# Patient Record
Sex: Male | Born: 1985 | Marital: Married
Health system: Midwestern US, Community
[De-identification: ages and names within clinical notes are randomized; demographics above are authoritative.]

## PROBLEM LIST (undated history)

## (undated) DIAGNOSIS — R634 Abnormal weight loss: Secondary | ICD-10-CM

## (undated) DIAGNOSIS — E349 Endocrine disorder, unspecified: Secondary | ICD-10-CM

## (undated) DIAGNOSIS — J45909 Unspecified asthma, uncomplicated: Secondary | ICD-10-CM

## (undated) DIAGNOSIS — I10 Essential (primary) hypertension: Secondary | ICD-10-CM

## (undated) DIAGNOSIS — G4730 Sleep apnea, unspecified: Secondary | ICD-10-CM

## (undated) DIAGNOSIS — J302 Other seasonal allergic rhinitis: Secondary | ICD-10-CM

## (undated) DIAGNOSIS — J342 Deviated nasal septum: Secondary | ICD-10-CM

## (undated) DIAGNOSIS — K219 Gastro-esophageal reflux disease without esophagitis: Secondary | ICD-10-CM

## (undated) DIAGNOSIS — Z9049 Acquired absence of other specified parts of digestive tract: Secondary | ICD-10-CM

## (undated) DIAGNOSIS — Z9852 Vasectomy status: Secondary | ICD-10-CM

## (undated) DIAGNOSIS — Z8659 Personal history of other mental and behavioral disorders: Secondary | ICD-10-CM

## (undated) DIAGNOSIS — S43439A Superior glenoid labrum lesion of unspecified shoulder, initial encounter: Secondary | ICD-10-CM

## (undated) HISTORY — DX: Sleep apnea, unspecified: G47.30

## (undated) HISTORY — DX: Endocrine disorder, unspecified: E34.9

## (undated) HISTORY — DX: Vasectomy status: Z98.52

## (undated) HISTORY — DX: Superior glenoid labrum lesion of unspecified shoulder, initial encounter: S43.439A

## (undated) HISTORY — DX: Gastro-esophageal reflux disease without esophagitis: K21.9

## (undated) HISTORY — DX: Deviated nasal septum: J34.2

## (undated) HISTORY — DX: Personal history of other mental and behavioral disorders: Z86.59

## (undated) HISTORY — DX: Acquired absence of other specified parts of digestive tract: Z90.49

## (undated) HISTORY — DX: Other seasonal allergic rhinitis: J30.2

## (undated) HISTORY — DX: Essential (primary) hypertension: I10

## (undated) HISTORY — DX: Unspecified asthma, uncomplicated: J45.909

---
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
## 2004-01-03 ENCOUNTER — Emergency Department (HOSPITAL_COMMUNITY): Admission: EM | Admit: 2004-01-03 | Discharge: 2004-01-03 | Payer: Self-pay | Admitting: Emergency Medicine

## 2013-08-26 LAB — TESTOSTERONE
Free Testosterone(Direct): 10.4
TESTOSTERONE FREE: 13.54
TESTOSTERONE: 5.11
Testosterone, Free Pct: 2.65

## 2013-08-26 LAB — LIPID PANEL
Cholesterol: 254 mg/dL — AB (ref 0–200)
HDL: 51 mg/dL (ref 35–70)
LDL Cholesterol: 173 mg/dL
Triglycerides: 150 mg/dL (ref 40–160)

## 2013-08-26 LAB — PROLACTIN: Prolactin: 5.2

## 2013-08-26 LAB — PSA: PSA: 0.7

## 2014-01-25 NOTE — Telephone Encounter (Signed)
Did you get a hold of him?

## 2014-01-25 NOTE — Telephone Encounter (Signed)
Tyrone Vazquez WAS IN [facility] 11/27-11/30 AND HAS A DIAGNOSIS OF CHOLELITHIASIS AND CHOLECYSTITIS. THE PATIENT'S WIFE STATED THAT THE ER DOCTOR WANTS HIM IN AS SOON AS POSSIBLE FOR HE IS STILL IN A LOT OF PAIN AND IS VOMITING. HOW SOON WOULD YOU LIKE TO GET HIM IN?

## 2014-01-26 NOTE — Telephone Encounter (Signed)
Yes sir I did! When I talked to him he said he does not want to wait and will figure something out. Then maybe an hour later his wife called back and [place] spoke with her. She was wanting to get him in today 01/26/2014 per the ER doctor  and didn't want to wait until the next available appointment and then the phone hung up. Tried calling but no answer.

## 2014-04-29 LAB — LIPID PANEL
Cholesterol: 260 mg/dL — AB (ref 0–200)
HDL: 35 mg/dL (ref 35–70)
LDL CALC: 166 mg/dL
Triglycerides: 295 mg/dL — AB (ref 40–160)

## 2014-04-29 LAB — PSA: PROSTATE SPECIFIC ANTIGEN: 0.8

## 2014-04-29 LAB — TESTOSTERONE
TESTOSTERONE: 196
Testosterone, Free: 5.1

## 2014-05-28 LAB — CORTISOL: CORTISOL: 3.5

## 2014-05-28 LAB — FSH/LH
FSH: 1.2
LH: 1.4

## 2014-05-28 LAB — PROLACTIN: Prolactin: 9.7

## 2014-06-07 ENCOUNTER — Ambulatory Visit: Admit: 2014-06-07 | Discharge: 2014-06-07 | Payer: BLUE CROSS/BLUE SHIELD | Attending: Clinical Nurse Specialist

## 2014-06-07 DIAGNOSIS — E291 Testicular hypofunction: Secondary | ICD-10-CM

## 2014-06-07 NOTE — Addendum Note (Signed)
Addended by: Perley JainKIANOS, DEVIN on: 06/07/2014 01:34 PM     Modules accepted: Orders

## 2014-06-07 NOTE — Progress Notes (Signed)
[facility]  [address]  [place] [id number]  Dept: [phone]  Loc: [phone]    Visit Date: 06/07/2014      HPI:     Bayard BeaverChase Tokunaga is a 29 y.o. male who presents today for:  Chief Complaint   Patient presents with   ??? Other     new patient       HPI:   Pt here for evaluation of   Pt went for 285 to 190 lbs in 10 months.  Started diet and exercise 2014 into 3.2015.  He states he started getting discomfort in muscles while working out so he stopped.  He doses go to gym 2-3 days per week now.  He complaints of low energy levels and fatigue. He was diagnosed with hypogonadism 2-3 years ago.  PCP did obtain BW.  He has been on DEPO 1.5 cc every  3 weeks.  He is unsure of dose.    He does not have gets  Did have vasectomy last year  No Hx of anabolic steroid use   No hx of testicular trauma   Denies ED  Positive desire   Testerone level 196 Free 5.1 (done at 1027 am) (04/29/14).  FSH and LH low.  Normal Prolactin and PSA   No evaluation for sleep apnea       Past Medical History   Diagnosis Date   ??? Asthma    ??? Hypertension    ??? Anxiety    ??? GERD (gastroesophageal reflux disease)       Past Surgical History   Procedure Laterality Date   ??? Cholecystectomy     ??? Rotator cuff repair Right      Current Outpatient Prescriptions   Medication Sig Dispense Refill   ??? chlorthalidone (HYGROTON) 25 MG tablet 25 mg     ??? DULoxetine (CYMBALTA) 60 MG capsule Take 60 mg by mouth daily     ??? ferrous sulfate 325 (65 FE) MG tablet Take 325 mg by mouth daily (with breakfast)     ??? gabapentin (NEURONTIN) 300 MG capsule Take 300 mg by mouth 3 times daily     ??? Lisdexamfetamine Dimesylate 60 MG CAPS Take by mouth     ??? metoclopramide (REGLAN) 10 MG tablet Take 10 mg by mouth 4 times daily     ??? prochlorperazine (COMPAZINE) 10 MG tablet Take 10 mg by mouth every 6 hours as needed     ??? propranolol (INDERAL) 40 MG tablet Take 40 mg by mouth 3 times daily     ??? ranitidine (ZANTAC) 150 MG tablet Take 150  mg by mouth 2 times daily     ??? venlafaxine 150 MG extended release tablet Take 150 mg by mouth daily (with breakfast)     ??? testosterone cypionate (DEPOTESTOTERONE CYPIONATE) 100 MG/ML injection Inject 100 mg into the muscle once 1.5 every three weeks     ??? Dexmethylphenidate HCl ER 30 MG CP24 Take by mouth       No current facility-administered medications for this visit.     Allergies   Allergen Reactions   ??? Pcn [Penicillins] Swelling     No family history on file.  History   Substance Use Topics   ??? Smoking status: Never Smoker    ??? Smokeless tobacco: Not on file   ??? Alcohol Use: Yes        Subjective:      Review of Systems   Constitutional:  Positive for fever, chills, appetite change and fatigue.   HENT: Positive for rhinorrhea and sinus pressure. Negative for trouble swallowing and voice change.    Eyes: Negative for pain, redness, itching and visual disturbance.   Respiratory: Negative for apnea, cough, chest tightness and shortness of breath.    Cardiovascular: Negative for chest pain, palpitations and leg swelling.   Gastrointestinal: Positive for nausea, abdominal pain and diarrhea. Negative for vomiting and constipation.   Endocrine: Positive for polydipsia. Negative for cold intolerance, heat intolerance and polyphagia.   Musculoskeletal: Positive for back pain, joint swelling and arthralgias.   Skin: Negative for rash.        Dry skin   Neurological: Positive for dizziness, weakness, light-headedness and headaches. Negative for numbness.   Psychiatric/Behavioral: Positive for sleep disturbance. The patient is nervous/anxious.        Objective:     BP 124/80 mmHg   Pulse 68   Ht  (1.854 m)   Wt 239 lb (108.41 kg)   BMI 31.54 kg/m2    Physical Exam   Constitutional: He is oriented to person, place, and time. He appears well-developed and well-nourished.   HENT:   Head: Normocephalic and atraumatic.   Eyes: EOM are normal. Right eye exhibits no discharge.   Neck: No tracheal deviation present. No  thyromegaly present.   Cardiovascular: Normal rate.    Pulmonary/Chest: He has no wheezes. He has no rales. He exhibits no tenderness.   Abdominal: He exhibits no distension. There is no tenderness. There is no rebound.   Musculoskeletal: He exhibits no edema or tenderness.   Neurological: He is oriented to person, place, and time.   Foot Exam:  2+pulses.  No wounds.  Good sensation on monofilament testing    Skin: No rash noted.   Psychiatric: He has a normal mood and affect.            Diagnostic Workup:       Assessment:     1. Hypogonadism in male     2. Fatigue, unspecified type  TSH without Reflex    T4, free    Testosterone free and total male    Luteinizing Hormone    Follicle Stimulating Hormone    Sex Hormone Binding Globulin       Plan:     1. Hypogonadism in male   Pt has hx of hypogonadism of unknown cause.  Labs reviewed.  Plan:  Pt agreeable to stop Depo injections for 2 months.  Will order labs to be obtained at that time. Pt understands to obtain labs between 8-9 am.   Further recommendations will be made after results are obtained.  Pt understands importance to establish cause.  Also explained importance to f/u with sleep medicine to evaluate for sleep apnea.  Pt to have done soon    2. Fatigue, unspecified type   Will assess  thyroid function            No Follow-up on file.    Serafina Mitchell, NP

## 2014-06-08 LAB — TSH: TSH: 1.76 u[IU]/mL (ref 0.358–3.74)

## 2014-06-08 LAB — T4, FREE: T4 Free: 1.17 nG/dL (ref 0.76–1.46)

## 2014-06-08 NOTE — Telephone Encounter (Signed)
-----   Message from Tyrone MitchellFrank A Gargasz, NP sent at 06/08/2014  8:18 AM EDT -----  Please call pt and inform him that thyroid levels are normal

## 2014-06-08 NOTE — Telephone Encounter (Signed)
Patient notified and understands.

## 2014-06-11 ENCOUNTER — Encounter: Attending: Clinical Nurse Specialist

## 2014-06-11 ENCOUNTER — Telehealth

## 2014-06-11 NOTE — Telephone Encounter (Signed)
Lab order faxed over to [facility] @ [phone]

## 2014-06-11 NOTE — Telephone Encounter (Addendum)
[phone] This patients wife is calling in again and would like the earliest possible appointment.  She has taken him to ER before they only given anti nausea suppository and none of this is helping him can you tell me which appointment slot to place the patient in or do you want to call her.  The number is the top of this message.  The wife has been asked to take the patient to ER several times and would prefer to come into the office.  At our earliest appointment.  She said the cortisol test needs done correctly by our Doctor and continues to request an earlier appointment the OM is informed call transferred.

## 2014-06-11 NOTE — Telephone Encounter (Signed)
Please call pt ands inform him that he should f/u with PCP regarding this.  If symptoms do not resolve should go to ED for evaluation.  Symptoms of nausea and vomiting not related to low testosterone levels

## 2014-06-11 NOTE — Telephone Encounter (Signed)
Called the patients voicemail and left the message to f/u with pcp and Er if issue continues.

## 2014-06-11 NOTE — Telephone Encounter (Signed)
Spoke with pt.  Cortisol level ordered.  Please have pt obtaincortisol level in am between 8-9 am.  Please fax lab slip to [facility].  They will get this done tomorrow.  Also advised if symptoms do not resolve to get to ED ASAP.

## 2014-06-11 NOTE — Telephone Encounter (Signed)
Patients wife is calling  She told me the patient came home early and is fatigued and N/V all night can you work this patient in to see him today wife is very worried.

## 2014-06-15 NOTE — Telephone Encounter (Signed)
Patient c/o insomnia, vomitting, and fatigue. Would like to know cortisol tests results and next plan of action.

## 2014-06-16 NOTE — Telephone Encounter (Signed)
Left message for pt to call back

## 2014-06-16 NOTE — Telephone Encounter (Signed)
Called Chases phone left message to call us and inform if able to have the cortisol test done.

## 2014-06-16 NOTE — Telephone Encounter (Signed)
Pt cortisol level normal.  He should f/u with PCP to discuss possible GI referral.  Thanks

## 2014-06-16 NOTE — Telephone Encounter (Signed)
Spoke to pt about the below MSG. Pt verbally understood MSG from [place]. Thanks

## 2014-06-16 NOTE — Telephone Encounter (Signed)
FG

## 2014-06-16 NOTE — Telephone Encounter (Signed)
Spoke with the patient and he had the test got results and will f/u with pcp.

## 2014-06-16 NOTE — Telephone Encounter (Signed)
Please clarify if lab has the results.

## 2014-08-18 ENCOUNTER — Encounter: Attending: Clinical Nurse Specialist

## 2014-11-27 HISTORY — PX: SHOULDER SURGERY: SHX246

## 2015-01-27 HISTORY — PX: CHOLECYSTECTOMY: SHX55

## 2015-06-22 ENCOUNTER — Encounter: Payer: Self-pay | Admitting: Osteopathic Medicine

## 2015-06-22 ENCOUNTER — Ambulatory Visit (INDEPENDENT_AMBULATORY_CARE_PROVIDER_SITE_OTHER): Payer: 59 | Admitting: Osteopathic Medicine

## 2015-06-22 VITALS — BP 152/96 | HR 59 | Ht 73.0 in | Wt 249.0 lb

## 2015-06-22 DIAGNOSIS — Z9852 Vasectomy status: Secondary | ICD-10-CM

## 2015-06-22 DIAGNOSIS — E349 Endocrine disorder, unspecified: Secondary | ICD-10-CM

## 2015-06-22 DIAGNOSIS — K219 Gastro-esophageal reflux disease without esophagitis: Secondary | ICD-10-CM

## 2015-06-22 DIAGNOSIS — Z862 Personal history of diseases of the blood and blood-forming organs and certain disorders involving the immune mechanism: Secondary | ICD-10-CM

## 2015-06-22 DIAGNOSIS — J302 Other seasonal allergic rhinitis: Secondary | ICD-10-CM | POA: Diagnosis not present

## 2015-06-22 DIAGNOSIS — R5382 Chronic fatigue, unspecified: Secondary | ICD-10-CM

## 2015-06-22 DIAGNOSIS — I10 Essential (primary) hypertension: Secondary | ICD-10-CM

## 2015-06-22 DIAGNOSIS — Z9049 Acquired absence of other specified parts of digestive tract: Secondary | ICD-10-CM | POA: Insufficient documentation

## 2015-06-22 DIAGNOSIS — E291 Testicular hypofunction: Secondary | ICD-10-CM | POA: Diagnosis not present

## 2015-06-22 HISTORY — DX: Vasectomy status: Z98.52

## 2015-06-22 HISTORY — DX: Acquired absence of other specified parts of digestive tract: Z90.49

## 2015-06-22 HISTORY — DX: Gastro-esophageal reflux disease without esophagitis: K21.9

## 2015-06-22 HISTORY — DX: Essential (primary) hypertension: I10

## 2015-06-22 HISTORY — DX: Other seasonal allergic rhinitis: J30.2

## 2015-06-22 HISTORY — DX: Endocrine disorder, unspecified: E34.9

## 2015-06-22 MED ORDER — CHLORTHALIDONE 25 MG PO TABS: 12.5000 mg | ORAL_TABLET | Freq: Every day | ORAL | Status: DC

## 2015-06-22 MED ORDER — AZELASTINE HCL 0.15 % NA SOLN: 2.0000 | Freq: Two times a day (BID) | NASAL | Status: DC

## 2015-06-22 NOTE — Patient Instructions (Signed)
Plan to follow-up every 3-4 months for management/monitoring of chronic medical issues. Please don't hesitate to make an appointment sooner if you're having any acute concerns or problems!  We are starting medication for blood pressure and getting labs done - labs need to be drawn as close to 8:00 am as possible.   Please let your pharmacy know when you are running low on medications/refills (do not wait until you are out of medicines). Your pharmacy will send our office a request for the appropriate medications. Please allow our office 2-3 business days to process the needed refills.   At any visits to any of your specialists, or if you get vaccines at your workplace or pharmacy, please give them our clinic information so that they can forward [place] any records, including any tests which are done or changes to your medications. This allows all your physicians to communicate effectively, putting your primary care doctor at the center of your medical care and allowing [place] to effectively coordinate your care.   Let's plan to follow-up here in the office in 1 months for follow-up on blood pressure and testosterone and hopefully by then we will have records from [place]. I will call yo once I've reviewed the [place] records, so if you don't hear from me by 07/15/15 (Friday) that means we haven't gotten the records so let us know if you don't hear anything.   I am out of the office in the first 2 weeks of May but my partners will be covering for me if there is anything you need! Hopefully your lab results will be back by then and I can call you before I am out, but please let us know if you don't hear back. We can plan to probably have to repeat the Testosterone levels anyway.   Please let us know if there is anything else we can do for you. Take care! -Dr. Loni Muse.

## 2015-06-22 NOTE — Progress Notes (Signed)
HPI: Alan Soto. Alan Soto is a 30 y.o. male who presents to [place] today for chief complaint of:  Chief Complaint  Patient presents with  . Establish Care    Patient wants to discuss Testosterone and allergies    TESTOSTERONE CONCERNS:  . Duration:  Was on T injections starting in 2014 or 2015, been off for about a year.  . Context: previously treated for Low T when he lived in [place] (thinks every 4 - 6 weeks) then lost insurance and had to stop this and some other meds. Had been referred to an endocrinologist but was unable to follow up fully due to loss of insurance.  . Assoc signs/symptoms: severe fatigue.   ALLERGIES . Severity: not too bad since moving down here . Context: seasonal/environmental and multiple food allergy/intolerance . Modifying factors: previously doing desensitization in [place] as well as cortisone shots on occasion. Rotates OTC meds - zyrtec, benadryl, allegra, has also done Flonase, never been on nasal antihistamine   HISTORY ADHD - previously on [name].   HISTORY HTN - was previously on meds, lost 100 lbs and this issue got better. Before the move, 130s/80s. (+)FH HTN    Past medical, social and family history reviewed: Past Medical History  Diagnosis Date  . Hypertension    Past Surgical History  Procedure Laterality Date  . Cholecystectomy  01/2015  . Shoulder surgery  11/2014   Social History  Substance Use Topics  . Smoking status: Not on file  . Smokeless tobacco: Not on file  . Alcohol Use: Not on file   No family history on file.  Current Outpatient Prescriptions  Medication Sig Dispense Refill  . Ferrous Sulfate (IRON) 325 (65 Fe) MG TABS Take by mouth 2 (two) times daily.    Marland Kitchen PROAIR HFA 108 (90 Base) MCG/ACT inhaler INHALE 2 PUFFS EVERY 4 TO 6 HOURS AS NEEDED  3  . ranitidine (ZANTAC) 150 MG tablet Take 150 mg by mouth 2 (two) times daily.     No current facility-administered medications for this  visit.   Allergies  Allergen Reactions  . Penicillins       Review of Systems: CONSTITUTIONAL:  No  fever, no chills, No  unintentional weight changes HEAD/EYES/EARS/NOSE/THROAT: (+) occasional headache, no vision change, no hearing change, No  sore throat, No  sinus pressure, (+) hay fever CARDIAC: No  chest pain, No  pressure, No palpitations, No  orthopnea RESPIRATORY: No  cough, No  shortness of breath/wheeze GASTROINTESTINAL: No  nausea, No  vomiting, No  abdominal pain, No  blood in stool, No  diarrhea, No  constipation  MUSCULOSKELETAL: No  myalgia/arthralgia GENITOURINARY: No  incontinence, No  abnormal genital bleeding/discharge SKIN: No  rash/wounds/concerning lesions HEM/ONC: No  easy bruising/bleeding, No  abnormal lymph node ENDOCRINE: No polyuria/polydipsia/polyphagia, No  heat/cold intolerance  NEUROLOGIC: No  weakness, No  dizziness, No  slurred speech PSYCHIATRIC: No  concerns with depression, No  concerns with anxiety, No sleep problems  Exam:  BP 152/96 mmHg  Pulse 59  Ht 6\' 1"  (1.854 m)  Wt 249 lb (112.946 kg)  BMI 32.86 kg/m2 similar on manual recheck of BP L and R arm Constitutional: VS see above. General Appearance: alert, well-developed, well-nourished, NAD Eyes: Normal lids and conjunctive, non-icteric sclera, PERRLA Ears, Nose, Mouth, Throat: MMM, Normal external inspection ears/nares/mouth/lips/gums, TM normal bilaterally. Pharynx no erythema, no exudate.  Neck: No masses, trachea midline. No thyroid enlargement/tenderness/mass appreciated. No lymphadenopathy Respiratory: Normal respiratory effort.  no wheeze, no rhonchi, no rales Cardiovascular: S1/S2 normal, no murmur, no rub/gallop auscultated. RRR. No lower extremity edema. Gastrointestinal: Nontender, no masses. No hepatomegaly, no splenomegaly. No hernia appreciated. Bowel sounds normal. Rectal exam deferred.  Musculoskeletal: Gait normal. No clubbing/cyanosis of digits.  Neurological: No cranial  nerve deficit on limited exam. Motor and sensation intact and symmetric Skin: warm, dry, intact. No rash/ulcer. No concerning nevi or subq nodules on limited exam.   Psychiatric: Normal judgment/insight. Normal mood and affect.     ASSESSMENT/PLAN: See pt instructions, await records, labs pending, restart low dose chlorthalidone.   Testosterone deficiency - Plan: Testosterone Total,Free,Bio, Males  Chronic fatigue - Plan: CBC with Differential/Platelet, COMPLETE METABOLIC PANEL WITH GFR, TSH, VITAMIN D 25 Hydroxy (Vit-D Deficiency, Fractures)  Seasonal allergies - Plan: Azelastine HCl 0.15 % SOLN  Gastroesophageal reflux disease, esophagitis presence not specified  History of anemia - Plan: CBC with Differential/Platelet  History of cholecystectomy - [id number] - complications from surgery, persistent N/V and abdominal pain, subsequent Endo/Colonoscopies fine, removal seems to have resulted in food intol/allerg  S/P vasectomy  Essential hypertension - Plan: CBC with Differential/Platelet, COMPLETE METABOLIC PANEL WITH GFR, TSH, Lipid panel, Aldosterone + renin activity w/ ratio, chlorthalidone (HYGROTON) 25 MG tablet     All questions were answered. Visit summary with updated medication list and pertinent instructions was printed for patient. ER/RTC precautions were reviewed with the patient. Return in about 1 month (around 07/22/2015), or sooner if needed, for BLOOD PRESSURE RECHECK, GO OVER LABS (OV30).

## 2015-06-23 ENCOUNTER — Other Ambulatory Visit: Payer: Self-pay | Admitting: Osteopathic Medicine

## 2015-06-23 MED ORDER — AZELASTINE HCL 0.1 % NA SOLN: 2.0000 | Freq: Two times a day (BID) | NASAL | Status: DC

## 2015-07-06 ENCOUNTER — Ambulatory Visit (INDEPENDENT_AMBULATORY_CARE_PROVIDER_SITE_OTHER): Payer: 59 | Admitting: Physician Assistant

## 2015-07-06 VITALS — BP 141/74 | HR 93 | Temp 98.9°F | Ht 73.0 in | Wt 249.0 lb

## 2015-07-06 DIAGNOSIS — J329 Chronic sinusitis, unspecified: Secondary | ICD-10-CM | POA: Diagnosis not present

## 2015-07-06 DIAGNOSIS — A499 Bacterial infection, unspecified: Secondary | ICD-10-CM | POA: Diagnosis not present

## 2015-07-06 DIAGNOSIS — B9689 Other specified bacterial agents as the cause of diseases classified elsewhere: Secondary | ICD-10-CM

## 2015-07-06 LAB — CBC WITH DIFFERENTIAL/PLATELET
BASOS ABS: 111 {cells}/uL (ref 0–200)
Basophils Relative: 1 %
EOS ABS: 666 {cells}/uL — AB (ref 15–500)
EOS PCT: 6 %
HCT: 45.1 % (ref 38.5–50.0)
HEMOGLOBIN: 15.5 g/dL (ref 13.2–17.1)
Lymphocytes Relative: 22 %
Lymphs Abs: 2442 cells/uL (ref 850–3900)
MCH: 26.9 pg — ABNORMAL LOW (ref 27.0–33.0)
MCHC: 34.4 g/dL (ref 32.0–36.0)
MCV: 78.3 fL — AB (ref 80.0–100.0)
MONOS PCT: 7 %
MPV: 9.6 fL (ref 7.5–12.5)
Monocytes Absolute: 777 cells/uL (ref 200–950)
NEUTROS ABS: 7104 {cells}/uL (ref 1500–7800)
NEUTROS PCT: 64 %
PLATELETS: 339 10*3/uL (ref 140–400)
RBC: 5.76 MIL/uL (ref 4.20–5.80)
RDW: 13.8 % (ref 11.0–15.0)
WBC: 11.1 10*3/uL — ABNORMAL HIGH (ref 3.8–10.8)

## 2015-07-06 MED ORDER — AZITHROMYCIN 250 MG PO TABS: ORAL_TABLET | ORAL | Status: DC

## 2015-07-06 MED ORDER — HYDROCODONE-HOMATROPINE 5-1.5 MG/5ML PO SYRP: 5.0000 mL | ORAL_SOLUTION | Freq: Every evening | ORAL | Status: DC | PRN

## 2015-07-06 NOTE — Progress Notes (Addendum)
   Subjective:    Patient ID: Alan Soto, male    DOB: 29-Feb-1985, 30 y.o.   MRN: [id number]  HPI Patient is complaining of productive cough, congestion, sore throat, headache, and left ear pain for the past 5 days. Denies fever, shortness of breath, wheezing, and chest tightness. Patient has been taking Dayquil, Nyquil, and Sudafed at home with no relief.    Review of Systems  Constitutional: Negative for fever, chills and fatigue.  HENT: Positive for congestion and ear pain (Left).   Respiratory: Positive for cough. Negative for shortness of breath and wheezing.   Neurological: Positive for headaches.  All other systems reviewed and are negative.      Objective:   Physical Exam  Constitutional: He is oriented to person, place, and time. He appears well-developed and well-nourished.  HENT:  Head: Normocephalic and atraumatic.  Right Ear: Tympanic membrane is not erythematous, not retracted and not bulging.  Left Ear: Tympanic membrane is erythematous. Tympanic membrane is not retracted and not bulging.  Nose: Right sinus exhibits maxillary sinus tenderness. Right sinus exhibits no frontal sinus tenderness. Left sinus exhibits maxillary sinus tenderness. Left sinus exhibits no frontal sinus tenderness.  Eyes: Conjunctivae are normal. Right eye exhibits no discharge. Left eye exhibits no discharge.  Cardiovascular: Normal rate, regular rhythm and normal heart sounds.   Pulmonary/Chest: Effort normal and breath sounds normal. No respiratory distress. He has no wheezes. He has no rales. He exhibits no tenderness.  Lymphadenopathy:    He has cervical adenopathy.  Neurological: He is alert and oriented to person, place, and time.  Skin: Skin is warm and dry.  Psychiatric: He has a normal mood and affect. His behavior is normal. Thought content normal.          Assessment & Plan:  1. Acute Bacterial Maxillary Sinusitis- Patient presents with 5 day history of cough, congestion,  sore throat, headache, left ear pain/pressure. On PE, he is tender to palpation of the maxillary sinuses. I am going to treat with azithromycin. I am also sending Hycodan for cough relief.

## 2015-07-06 NOTE — Patient Instructions (Signed)

## 2015-07-07 ENCOUNTER — Encounter: Payer: Self-pay | Admitting: Family Medicine

## 2015-07-07 ENCOUNTER — Ambulatory Visit (INDEPENDENT_AMBULATORY_CARE_PROVIDER_SITE_OTHER): Payer: 59 | Admitting: Family Medicine

## 2015-07-07 VITALS — BP 154/92 | HR 83 | Temp 98.7°F | Wt 240.0 lb

## 2015-07-07 DIAGNOSIS — R112 Nausea with vomiting, unspecified: Secondary | ICD-10-CM

## 2015-07-07 LAB — COMPLETE METABOLIC PANEL WITH GFR
ALBUMIN: 4.6 g/dL (ref 3.6–5.1)
ALK PHOS: 68 U/L (ref 40–115)
ALT: 29 U/L (ref 9–46)
AST: 22 U/L (ref 10–40)
BILIRUBIN TOTAL: 0.6 mg/dL (ref 0.2–1.2)
BUN: 7 mg/dL (ref 7–25)
CO2: 26 mmol/L (ref 20–31)
Calcium: 9.7 mg/dL (ref 8.6–10.3)
Chloride: 97 mmol/L — ABNORMAL LOW (ref 98–110)
Creat: 0.84 mg/dL (ref 0.60–1.35)
Glucose, Bld: 129 mg/dL — ABNORMAL HIGH (ref 65–99)
POTASSIUM: 4.6 mmol/L (ref 3.5–5.3)
Sodium: 136 mmol/L (ref 135–146)
Total Protein: 7.7 g/dL (ref 6.1–8.1)

## 2015-07-07 LAB — LIPID PANEL
CHOLESTEROL: 255 mg/dL — AB (ref 125–200)
HDL: 42 mg/dL (ref >=40)
LDL Cholesterol: 164 mg/dL — ABNORMAL HIGH (ref <130)
TRIGLYCERIDES: 245 mg/dL — AB (ref <150)
Total CHOL/HDL Ratio: 6.1 Ratio — ABNORMAL HIGH (ref <=5.0)
VLDL: 49 mg/dL — ABNORMAL HIGH (ref <30)

## 2015-07-07 LAB — VITAMIN D 25 HYDROXY (VIT D DEFICIENCY, FRACTURES): VIT D 25 HYDROXY: 22 ng/mL — AB (ref 30–100)

## 2015-07-07 LAB — TESTOSTERONE TOTAL,FREE,BIO, MALES
ALBUMIN: 4.6 g/dL (ref 3.6–5.1)
SEX HORMONE BINDING: 30 nmol/L (ref 10–50)
Testosterone, Bioavailable: 135.2 ng/dL (ref 130.5–681.7)
Testosterone, Free: 64.4 pg/mL (ref 47.0–244.0)
Testosterone: 451 ng/dL (ref 250–827)

## 2015-07-07 LAB — TSH: TSH: 2.45 m[IU]/L (ref 0.40–4.50)

## 2015-07-07 MED ORDER — PROMETHAZINE HCL 25 MG PO TABS: 25.0000 mg | ORAL_TABLET | Freq: Three times a day (TID) | ORAL | Status: DC | PRN

## 2015-07-07 NOTE — Progress Notes (Signed)
CC: Alan Soto is a 30 y.o. male is here for Allergic Reaction   Subjective: HPI:  Complains of vomiting, decreased appetite and fatigue present ever since yesterday that began one hour after taking azithromycin and Hycodan. He vomited frequently yesterday until he found Zofran and was able to fall sleep without any nauseousness. He woke up this morning and after taking azithromycin and Hycodan throughout again. He tells me that his tolerated both these medications in the past. He had blood work done yesterday that showed a unremarkable CMP. He denies any abdominal pain unless he is throwing up. No diarrhea nor genitourinary complaints. No chest pain or shortness of breath.   Review Of Systems Outlined In HPI  Past Medical History  Diagnosis Date  . Hypertension     Past Surgical History  Procedure Laterality Date  . Cholecystectomy  01/2015  . Shoulder surgery  11/2014   No family history on file.  Social History   Social History  . Marital Status: Single    Spouse Name: N/A  . Number of Children: N/A  . Years of Education: N/A   Occupational History  . Not on file.   Social History Main Topics  . Smoking status: Never Smoker   . Smokeless tobacco: Not on file  . Alcohol Use: Not on file  . Drug Use: Not on file  . Sexual Activity: Not on file   Other Topics Concern  . Not on file   Social History Narrative     Objective: BP 154/92 mmHg  Pulse 83  Temp(Src) 98.7 F (37.1 C) (Oral)  Wt 240 lb (108.863 kg)  SpO2 93%  Vital signs reviewed. General: Alert and Oriented, No Acute Distress HEENT: Pupils equal, round, reactive to light. Conjunctivae clear.  External ears unremarkable.  Moist mucous membranes. Lungs: Clear and comfortable work of breathing, speaking in full sentences without accessory muscle use. Cardiac: Regular rate and rhythm.  Neuro: CN II-XII grossly intact, gait normal. Extremities: No peripheral edema.  Strong peripheral pulses.   Mental Status: No depression, anxiety, nor agitation. Logical though process. Skin: Warm and dry.  Assessment & Plan: Alan Soto was seen today for allergic reaction.  Diagnoses and all orders for this visit:  Nausea and vomiting, intractability of vomiting not specified, unspecified vomiting type  Other orders -     promethazine (PHENERGAN) 25 MG tablet; Take 1 tablet (25 mg total) by mouth every 8 (eight) hours as needed for nausea or vomiting.   The presence of the patient's I went through his old past medical records that were recently faxed to [place] and we learned that he is never actually taken Hycodan before. It's likely that the Hycodan as was causing him to have vomiting so encouraged him to stop taking this medication. He was given a single tablet of promethazine today and 250 mg of azithromycin that was likely throwing up earlier this morning. He and his wife requesting IV fluids due to his inability to keep food and liquid down. I let them know that is not tachycardic and is not hypotensive so it's not 100% necessary but the LEFT on the table and they decided to go through with it. He received 1 L of IV fluids and told me he felt a little better from a hydration standpoint after he received this. Signs and symptoms requring emergent/urgent reevaluation were discussed with the patient.  25 minutes spent face-to-face during visit today of which at least 50% was counseling or coordinating care regarding:  1. Nausea and vomiting, intractability of vomiting not specified, unspecified vomiting type      Return if symptoms worsen or fail to improve.

## 2015-07-07 NOTE — Progress Notes (Signed)
   Subjective:    I'm seeing this patient as a consultation for:  Dr. Marcial Pacas  CC: Venous access  HPI: This is a pleasant 30 year old male with dehydration, and inability to take oral rehydration, I'm consulted for venous access.  Past medical history, Surgical history, Family history not pertinant except as noted below, Social history, Allergies, and medications have been entered into the medical record, reviewed, and no changes needed.   Review of Systems: No headache, visual changes, nausea, vomiting, diarrhea, constipation, dizziness, abdominal pain, skin rash, fevers, chills, night sweats, weight loss, swollen lymph nodes, body aches, joint swelling, muscle aches, chest pain, shortness of breath, mood changes, visual or auditory hallucinations.   Objective:   General: Well Developed, well nourished, and in no acute distress.  Neuro/Psych: Alert and oriented x3, extra-ocular muscles intact, able to move all 4 extremities, sensation grossly intact. Skin: Warm and dry, no rashes noted.  Respiratory: Not using accessory muscles, speaking in full sentences, trachea midline.  Cardiovascular: Pulses palpable, no extremity edema. Abdomen: Does not appear distended.  20-gauge angiocatheter placed in the left venous plexus of the hand, further fluid management per primary treating provider.  Impression and Recommendations:   This case required medical decision making of moderate complexity.  1. Venous access: 20-gauge angiocatheter placed in the left venous plexus of the hand, further management per primary. ___________________________________________ Gwen Her. Dianah Field, M.D., ABFM., CAQSM. Primary Care and [place] Instructor of [employer] [employer] [facility] of Medicine

## 2015-07-11 ENCOUNTER — Telehealth: Payer: Self-pay | Admitting: Family Medicine

## 2015-07-11 MED ORDER — VITAMIN D (ERGOCALCIFEROL) 1.25 MG (50000 UNIT) PO CAPS: 50000.0000 [IU] | ORAL_CAPSULE | ORAL | Status: DC

## 2015-07-11 NOTE — Telephone Encounter (Signed)
Will you please let patient know that all of his labs are not back as of today but from what's resulted so far it looks like his vitamin D level is deficient. This can contribute to fatigue and I"d recommend he start on a weekly supplement that I sent to his pharmacy.

## 2015-07-11 NOTE — Telephone Encounter (Signed)
CALLED TO GIVE PATIENT RESULTS BUT WAS UNABLE TO LEAVE A MESSAGE DUE TO A RECORDING COMING ON SAYING PATIENT IS UNABLE TO ACCEPT CALLS AT THIS TIME. Rhonda Cunningham,CMA

## 2015-07-13 LAB — ALDOSTERONE + RENIN ACTIVITY W/ RATIO
ALDO / PRA RATIO: 3.1 ratio (ref 0.9–28.9)
ALDOSTERONE: 15 ng/dL
PRA LC/MS/MS: 4.82 ng/mL/h (ref 0.25–5.82)

## 2015-07-21 ENCOUNTER — Encounter: Payer: Self-pay | Admitting: Osteopathic Medicine

## 2015-07-21 DIAGNOSIS — Z8659 Personal history of other mental and behavioral disorders: Secondary | ICD-10-CM

## 2015-07-21 DIAGNOSIS — S43439A Superior glenoid labrum lesion of unspecified shoulder, initial encounter: Secondary | ICD-10-CM

## 2015-07-21 DIAGNOSIS — G4730 Sleep apnea, unspecified: Secondary | ICD-10-CM

## 2015-07-21 HISTORY — DX: Superior glenoid labrum lesion of unspecified shoulder, initial encounter: S43.439A

## 2015-07-21 HISTORY — DX: Personal history of other mental and behavioral disorders: Z86.59

## 2015-07-21 HISTORY — DX: Sleep apnea, unspecified: G47.30

## 2015-07-22 ENCOUNTER — Encounter: Payer: Self-pay | Admitting: Osteopathic Medicine

## 2015-07-22 ENCOUNTER — Ambulatory Visit (INDEPENDENT_AMBULATORY_CARE_PROVIDER_SITE_OTHER): Payer: 59 | Admitting: Osteopathic Medicine

## 2015-07-22 VITALS — BP 131/86 | HR 68 | Ht 73.0 in | Wt 252.0 lb

## 2015-07-22 DIAGNOSIS — B9689 Other specified bacterial agents as the cause of diseases classified elsewhere: Secondary | ICD-10-CM | POA: Insufficient documentation

## 2015-07-22 DIAGNOSIS — D721 Eosinophilia, unspecified: Secondary | ICD-10-CM | POA: Insufficient documentation

## 2015-07-22 DIAGNOSIS — J329 Chronic sinusitis, unspecified: Secondary | ICD-10-CM

## 2015-07-22 DIAGNOSIS — J302 Other seasonal allergic rhinitis: Secondary | ICD-10-CM | POA: Diagnosis not present

## 2015-07-22 DIAGNOSIS — I10 Essential (primary) hypertension: Secondary | ICD-10-CM | POA: Diagnosis not present

## 2015-07-22 MED ORDER — DOXYCYCLINE MONOHYDRATE 100 MG PO CAPS: 100.0000 mg | ORAL_CAPSULE | Freq: Two times a day (BID) | ORAL | Status: DC

## 2015-07-22 NOTE — Progress Notes (Signed)
HPI: Alan Soto. Rabelo is a 30 y.o. male who presents to [facility] today for chief complaint of:  Chief Complaint  Patient presents with  . Follow-up    SINUS/EAR - Seen by Luvenia Starch 07/06/15 (16 days ago) was given z-pack and hycodan then having vomiting issues, came in for IV fluids after developing nausea and vomiting. Previously seen by ENT in back in [place], was plan for CT but this was unable to be done. Taking Benadryl at night before sleep. Astelin helping.   TESTOSTERONE CONCERNS:  . Duration:  Was on T injections starting in 2014 or 2015, been off for about a year.  . Context: previously treated for Low T when he lived in [place] then lost insurance and had to stop this and some other meds. Had been referred to an endocrinologist but was unable to follow up fully due to loss of insurance. Reviewed records from Endocrine, nothing to really add.  . Assoc signs/symptoms: severe fatigue.   HISTORY HTN - was previously on meds, lost 100 lbs and this issue got better. Before the move, 130s/80s. (+)FH HTN. Restarted Chlorthalidone last visit, pt doing well.     Past medical, social and family history reviewed: Past Medical History  Diagnosis Date  . Hypertension    Past Surgical History  Procedure Laterality Date  . Cholecystectomy  01/2015  . Shoulder surgery  11/2014   Social History  Substance Use Topics  . Smoking status: Never Smoker   . Smokeless tobacco: Not on file  . Alcohol Use: Not on file   No family history on file.  Current Outpatient Prescriptions  Medication Sig Dispense Refill  . azelastine (ASTELIN) 0.1 % nasal spray Place 2 sprays into both nostrils 2 (two) times daily. 30 mL 12  . chlorthalidone (HYGROTON) 25 MG tablet Take 0.5 tablets (12.5 mg total) by mouth daily. 30 tablet 1  . Ferrous Sulfate (IRON) 325 (65 Fe) MG TABS Take by mouth 2 (two) times daily.    Marland Kitchen PROAIR HFA 108 (90 Base) MCG/ACT inhaler INHALE 2 PUFFS EVERY 4 TO  6 HOURS AS NEEDED  3  . promethazine (PHENERGAN) 25 MG tablet Take 1 tablet (25 mg total) by mouth every 8 (eight) hours as needed for nausea or vomiting. 20 tablet 0  . ranitidine (ZANTAC) 150 MG tablet Take 150 mg by mouth 2 (two) times daily.    . Vitamin D, Ergocalciferol, (DRISDOL) 50000 units CAPS capsule Take 1 capsule (50,000 Units total) by mouth every 7 (seven) days. Recheck Vitamin D in 3 Months 12 capsule 0   No current facility-administered medications for this visit.   Allergies  Allergen Reactions  . Penicillins Shortness Of Breath      Review of Systems: CONSTITUTIONAL:  No  fever, no chills, No  unintentional weight changes HEAD/EYES/EARS/NOSE/THROAT: (+) occasional headache, no vision change, no hearing change, No  sore throat, (+) sinus pressure, (+) hay fever CARDIAC: No  chest pain, No  pressure, No palpitations, No  orthopnea RESPIRATORY: No  cough, No  shortness of breath/wheeze GASTROINTESTINAL: No  nausea, No  vomiting, No  abdominal pain   Exam:  BP 131/86 mmHg  Pulse 68  Ht 6\' 1"  (1.854 m)  Wt 252 lb (114.306 kg)  BMI 33.25 kg/m2  Constitutional: VS see above. General Appearance: alert, well-developed, well-nourished, NAD Eyes: Normal lids and conjunctive, non-icteric sclera Ears, Nose, Mouth, Throat: MMM, Normal external inspection ears/nares/mouth/lips/gums Neck: No masses, trachea midline Respiratory: Normal respiratory  effort.  Musculoskeletal: Gait normal Neurological: No cranial nerve deficit on limited exam. Skin: warm, dry, intact. No rash/ulcer.  Psychiatric: Normal judgment/insight. Normal mood and affect.     ASSESSMENT/PLAN:  Labs reviewed with the patient in detail. His testosterone levels are normal, patient was given option to repeat these at a later date versus referral to endocrinology for further discussion. Also think about possible repeating. Cholesterol and glucose is elevated however patient did not have these labs done fasting.  Eosinophils were elevated but this is to be expected in someone with severe allergies. White blood cells mildly elevated also, however patient was ill at the time. Depending on CT sinuses results, may need ENT referral versus consider allergy referral to resume desensitization. Follow up annually/depending on CT results   Chronic sinusitis, unspecified location - Plan: CT Maxillofacial WO CM  Essential hypertension  Seasonal allergies  Eosinophilia   All questions were answered. Visit summary with updated medication list and pertinent instructions was printed for patient. ER/RTC precautions were reviewed with the patient. Return as needed/as directed  and depending on CT results.

## 2015-09-06 ENCOUNTER — Telehealth: Payer: Self-pay

## 2015-09-06 MED ORDER — LEVOFLOXACIN 500 MG PO TABS: 500.0000 mg | ORAL_TABLET | Freq: Every day | ORAL | Status: DC

## 2015-09-06 NOTE — Telephone Encounter (Signed)
Patient called stated that he was seen in May for a sinus infection and was given a antibiotic. Patient stated that it has come back and he is requesting a Rx for antibiotic. Please advise if patient needs appointment or if something will be sent to his pharmacy. Rhonda Cunningham,CMA

## 2015-09-06 NOTE — Telephone Encounter (Signed)
Sent antibiotic to pharmacy. Would advise he fill this if not better in 7 - 10 days after symptoms began, as usually sinus issues are due to viral respiratory infection or allergies. Be sure to be using nasal spray and antihistamine/decongestant.   Please ask patient about CT sinus - we discussed this last visit and I ordered it, not sure this was ever approved/done. If still having recurrent sinus issues, he needs the scan done and possibly other follow-up based on CT results, especially if this antibiotic doesn't help.

## 2015-09-07 NOTE — Telephone Encounter (Signed)
spoek to patient gave him advise as noted below. Rhonda Cunningham,CMA

## 2015-10-17 ENCOUNTER — Other Ambulatory Visit: Payer: Self-pay | Admitting: Osteopathic Medicine

## 2015-10-17 DIAGNOSIS — I10 Essential (primary) hypertension: Secondary | ICD-10-CM

## 2015-11-07 ENCOUNTER — Telehealth: Payer: Self-pay

## 2015-11-07 NOTE — Telephone Encounter (Signed)
Pt called reporting that he is still having the same Sx from back on 07/22/15.  He is asking for a antibiotic. He stated that he can not come in for a visit due to being in meetings all day and he will be out town for the next 2 weeks. Please advise.

## 2015-11-07 NOTE — Telephone Encounter (Signed)
I'm very sorry for his inconvenience, but he has already had 3 round of antibiotics for this issue and at this point he really needs CT of the sinuses and/or ENT follow-up. I cannot prescribe additional antibiotics without physical exam, I sent antibiotics for him in July as well. Please see if he can get CT sinuses over a weekend when he is free, or if he is able to be scheduled later in the day (not sure when imaging closes).

## 2015-11-07 NOTE — Telephone Encounter (Signed)
Pt.notified

## 2015-11-08 ENCOUNTER — Telehealth: Payer: Self-pay

## 2015-11-08 NOTE — Telephone Encounter (Signed)
Call was already taken care of. Suzi Hernan,CMA

## 2015-12-16 ENCOUNTER — Ambulatory Visit (INDEPENDENT_AMBULATORY_CARE_PROVIDER_SITE_OTHER): Payer: 59 | Admitting: Osteopathic Medicine

## 2015-12-16 ENCOUNTER — Encounter: Payer: Self-pay | Admitting: Osteopathic Medicine

## 2015-12-16 VITALS — BP 149/88 | HR 78 | Ht 73.0 in | Wt 257.0 lb

## 2015-12-16 DIAGNOSIS — J329 Chronic sinusitis, unspecified: Secondary | ICD-10-CM | POA: Diagnosis not present

## 2015-12-16 DIAGNOSIS — R930 Abnormal findings on diagnostic imaging of skull and head, not elsewhere classified: Secondary | ICD-10-CM

## 2015-12-16 DIAGNOSIS — F988 Other specified behavioral and emotional disorders with onset usually occurring in childhood and adolescence: Secondary | ICD-10-CM | POA: Diagnosis not present

## 2015-12-16 DIAGNOSIS — Z862 Personal history of diseases of the blood and blood-forming organs and certain disorders involving the immune mechanism: Secondary | ICD-10-CM

## 2015-12-16 DIAGNOSIS — J342 Deviated nasal septum: Secondary | ICD-10-CM

## 2015-12-16 DIAGNOSIS — E559 Vitamin D deficiency, unspecified: Secondary | ICD-10-CM | POA: Diagnosis not present

## 2015-12-16 DIAGNOSIS — I10 Essential (primary) hypertension: Secondary | ICD-10-CM | POA: Diagnosis not present

## 2015-12-16 DIAGNOSIS — J320 Chronic maxillary sinusitis: Secondary | ICD-10-CM

## 2015-12-16 MED ORDER — CHLORTHALIDONE 25 MG PO TABS: 25.0000 mg | ORAL_TABLET | Freq: Every day | ORAL | 0 refills | Status: DC

## 2015-12-16 MED ORDER — LISDEXAMFETAMINE DIMESYLATE 30 MG PO CAPS: 30.0000 mg | ORAL_CAPSULE | ORAL | 0 refills | Status: DC

## 2015-12-16 MED ORDER — VITAMIN D (ERGOCALCIFEROL) 1.25 MG (50000 UNIT) PO CAPS: 50000.0000 [IU] | ORAL_CAPSULE | ORAL | 0 refills | Status: DC

## 2015-12-16 MED ORDER — LEVOFLOXACIN 750 MG PO TABS: 750.0000 mg | ORAL_TABLET | Freq: Every day | ORAL | 0 refills | Status: DC

## 2015-12-16 NOTE — Progress Notes (Addendum)
HPI: Alan Soto. Alan Soto is a 30 y.o. male  who presents to [facility] today, 12/16/15,  for chief complaint of:  Chief Complaint  Patient presents with  . Sinus Problem    Sinus . Context: Frequent recurrent sinus infections. Prior to moving here from [place] was seeing Alan Soto and throat specialist who recommended CT sinus that he was unable to get this done. Previously on allergy immunotherapy as well, requests referral to allergist . Location: sinus/facial . Quality: throbbing pain, sinus drainage . Duration: On and off for a few years . Modifying factors: Failure of multiple antibiotics including levofloxacin, doxycycline, amoxicillin/clavulanic acid . Assoc signs/symptoms: Sinus headache, nasal drainage, ear fullness  Vitamin D deficiency: Patient requests recheck  Essential hypertension: No chest pain, pressure, shortness of breath. Reports some stress/pain today. Not out of any medications.  Adult attention deficit disorder: Previously on [name], records were reviewed from [place]. Patient requests restarting this medication, has been struggling at work as far as motivation/ability to focus.    Past medical, surgical, social and family history reviewed: Past Medical History:  Diagnosis Date  . Hypertension    Past Surgical History:  Procedure Laterality Date  . CHOLECYSTECTOMY  01/2015  . SHOULDER SURGERY  11/2014   Social History  Substance Use Topics  . Smoking status: Never Smoker  . Smokeless tobacco: Not on file  . Alcohol use Not on file   No family history on file.   Current medication list and allergy/intolerance information reviewed:   Current Outpatient Prescriptions  Medication Sig Dispense Refill  . azelastine (ASTELIN) 0.1 % nasal spray Place 2 sprays into both nostrils 2 (two) times daily. 30 mL 12  . chlorthalidone (HYGROTON) 25 MG tablet Take 1 tablet (25 mg total) by mouth daily. APPOINTMENT NEEDED FOR FURTHER REFILLS  30 tablet 0  . Ferrous Sulfate (IRON) 325 (65 Fe) MG TABS Take by mouth 2 (two) times daily.    Marland Kitchen PROAIR HFA 108 (90 Base) MCG/ACT inhaler INHALE 2 PUFFS EVERY 4 TO 6 HOURS AS NEEDED  3  . promethazine (PHENERGAN) 25 MG tablet Take 1 tablet (25 mg total) by mouth every 8 (eight) hours as needed for nausea or vomiting. 20 tablet 0  . ranitidine (ZANTAC) 150 MG tablet Take 150 mg by mouth 2 (two) times daily.    . Vitamin D, Ergocalciferol, (DRISDOL) 50000 units CAPS capsule Take 1 capsule (50,000 Units total) by mouth every 7 (seven) days. Recheck Vitamin D in 3 Months 12 capsule 0   No current facility-administered medications for this visit.    Allergies  Allergen Reactions  . Penicillins Shortness Of Breath      Review of Systems:  Constitutional:  No  fever, no chills, +recent illness, No unintentional weight changes. No significant fatigue.   HEENT: +headache, no vision change, no hearing change, No sore throat, +sinus pressure  Cardiac: No  chest pain, No  pressure, No palpitations  Respiratory:  No  shortness of breath. No  Cough  Gastrointestinal: No  abdominal pain, No  nausea,   Neurologic: No  weakness, +occasional dizziness  Exam:  BP (!) 149/88   Pulse 78   Ht '6\' 1"'  (1.854 m)   Wt 257 lb (116.6 kg)   BMI 33.91 kg/m   Constitutional: VS see above. General Appearance: alert, well-developed, well-nourished, NAD  Eyes: Normal lids and conjunctive, non-icteric sclera  Ears, Nose, Mouth, Throat: MMM, Normal external inspection ears/nares/mouth/lips/gums. TM normal bilaterally. Pharynx/tonsils no erythema, no  exudate. Nasal mucosa normal.   Head: +tenderness maxillary sinuses bilaterally  Neck: No masses, trachea midline. No thyroid enlargement. No tenderness/mass appreciated. No lymphadenopathy  Respiratory: Normal respiratory effort. no wheeze, no rhonchi, no rales  Cardiovascular: S1/S2 normal, no murmur, no rub/gallop auscultated. RRR.    Results for  orders placed or performed in visit on 12/16/15 (from the past 72 hour(s))  CBC with Differential/Platelet     Status: Abnormal   Collection Time: 12/16/15 11:42 AM  Result Value Ref Range   WBC 9.4 3.8 - 10.8 K/uL   RBC 5.88 (H) 4.20 - 5.80 MIL/uL   Hemoglobin 15.5 13.2 - 17.1 g/dL   HCT 45.2 38.5 - 50.0 %   MCV 76.9 (L) 80.0 - 100.0 fL   MCH 26.4 (L) 27.0 - 33.0 pg   MCHC 34.3 32.0 - 36.0 g/dL   RDW 14.0 11.0 - 15.0 %   Platelets 349 140 - 400 K/uL   MPV 9.3 7.5 - 12.5 fL   Neutro Abs 4,512 1,500 - 7,800 cells/uL   Lymphs Abs 3,384 850 - 3,900 cells/uL   Monocytes Absolute 752 200 - 950 cells/uL   Eosinophils Absolute 564 (H) 15 - 500 cells/uL   Basophils Absolute 188 0 - 200 cells/uL   Neutrophils Relative % 48 %   Lymphocytes Relative 36 %   Monocytes Relative 8 %   Eosinophils Relative 6 %   Basophils Relative 2 %   Smear Review Criteria for review not met   COMPLETE METABOLIC PANEL WITH GFR     Status: None   Collection Time: 12/16/15 11:42 AM  Result Value Ref Range   Sodium 137 135 - 146 mmol/L   Potassium 4.2 3.5 - 5.3 mmol/L   Chloride 101 98 - 110 mmol/L   CO2 23 20 - 31 mmol/L   Glucose, Bld 83 65 - 99 mg/dL   BUN 8 7 - 25 mg/dL   Creat 0.94 0.60 - 1.35 mg/dL   Total Bilirubin 0.5 0.2 - 1.2 mg/dL   Alkaline Phosphatase 44 40 - 115 U/L   AST 24 10 - 40 U/L   ALT 33 9 - 46 U/L   Total Protein 8.0 6.1 - 8.1 g/dL   Albumin 4.6 3.6 - 5.1 g/dL   Calcium 9.8 8.6 - 10.3 mg/dL   GFR, Est African American >89 >=60 mL/min   GFR, Est Non African American >89 >=60 mL/min  TSH     Status: None   Collection Time: 12/16/15 11:42 AM  Result Value Ref Range   TSH 2.16 0.40 - 4.50 mIU/L  VITAMIN D 25 Hydroxy (Vit-D Deficiency, Fractures)     Status: Abnormal   Collection Time: 12/16/15 11:42 AM  Result Value Ref Range   Vit D, 25-Hydroxy 24 (L) 30 - 100 ng/mL    Comment: Vitamin D Status           25-OH Vitamin D        Deficiency                <20 ng/mL         Insufficiency         20 - 29 ng/mL        Optimal             > or = 30 ng/mL   For 25-OH Vitamin D testing on patients on D2-supplementation and patients for whom quantitation of D2 and D3 fractions is required, the [facility] 25-OH VIT D, (  D2,D3), LC/MS/MS is recommended: order code [id number] (patients > 2 yrs).    Ct Maxillofacial Wo Cm  Result Date: 12/23/2015 CLINICAL DATA:  Frequent recurrent sinus infection for years. Throbbing pain. Headache. EXAM: CT MAXILLOFACIAL WITHOUT CONTRAST TECHNIQUE: Multidetector CT imaging of the maxillofacial structures was performed. Multiplanar CT image reconstructions were also generated. A small metallic BB was placed on the right temple in order to reliably differentiate right from left. COMPARISON:  None. FINDINGS: Osseous: Hypoplastic RIGHT maxillary sinus. No worrisome osseous lesion. Orbits: Negative. No traumatic or inflammatory finding. Sinuses: Chronic RIGHT maxillary sinusitis. Minimal layering fluid LEFT maxillary sinus suggesting acuity. BILATERAL anterior and posterior ethmoid fluid. Mucosal thickening both divisions sphenoid. Small but completely opacified RIGHT frontal sinus. LEFT frontal sinus essentially clear. 3 mm nasal septal deviation LEFT to RIGHT. RIGHT infundibular narrowing. RIGHT middle turbinate concha bullosa. Soft tissues: Unremarkable. Limited intracranial: No significant or unexpected finding. IMPRESSION: Chronic RIGHT paranasal sinus disease, ostiomeatal unit pattern of obstruction. Minimal layering fluid LEFT maxillary sinus, uncertain significance. Electronically Signed   By: Staci Righter M.D.   On: 12/23/2015 13:48       ASSESSMENT/PLAN:   Chronic sinusitis, unspecified location - Plan: CT Maxillofacial WO CM, Ambulatory referral to Allergy, levofloxacin (LEVAQUIN) 750 MG tablet  Recurrent sinusitis - Plan: Ambulatory referral to Allergy, levofloxacin (LEVAQUIN) 750 MG tablet  Essential hypertension - Plan:  chlorthalidone (HYGROTON) 25 MG tablet, CBC with Differential/Platelet, COMPLETE METABOLIC PANEL WITH GFR, TSH, VITAMIN D 25 Hydroxy (Vit-D Deficiency, Fractures)  Vitamin D deficiency - Plan: Vitamin D, Ergocalciferol, (DRISDOL) 50000 units CAPS capsule, VITAMIN D 25 Hydroxy (Vit-D Deficiency, Fractures)  Adult attention deficit disorder - Plan: lisdexamfetamine ([name]) 30 MG capsule  History of anemia  Right maxillary sinusitis, chronic - Plan: Ambulatory referral to ENT  Nasal septal deviation - Plan: Ambulatory referral to ENT  Abnormal CT scan, sinus - Plan: Ambulatory referral to ENT     Visit summary with medication list and pertinent instructions was printed for patient to review. All questions at time of visit were answered - patient instructed to contact office with any additional concerns. ER/RTC precautions were reviewed with the patient. Follow-up plan: Return in about 3 months (around 03/17/2016) for ADHD MEDICATION or sooner if needed .

## 2015-12-17 LAB — CBC WITH DIFFERENTIAL/PLATELET
BASOS PCT: 2 %
Basophils Absolute: 188 cells/uL (ref 0–200)
EOS ABS: 564 {cells}/uL — AB (ref 15–500)
EOS PCT: 6 %
HCT: 45.2 % (ref 38.5–50.0)
Hemoglobin: 15.5 g/dL (ref 13.2–17.1)
Lymphocytes Relative: 36 %
Lymphs Abs: 3384 cells/uL (ref 850–3900)
MCH: 26.4 pg — ABNORMAL LOW (ref 27.0–33.0)
MCHC: 34.3 g/dL (ref 32.0–36.0)
MCV: 76.9 fL — ABNORMAL LOW (ref 80.0–100.0)
MONOS PCT: 8 %
MPV: 9.3 fL (ref 7.5–12.5)
Monocytes Absolute: 752 cells/uL (ref 200–950)
NEUTROS ABS: 4512 {cells}/uL (ref 1500–7800)
Neutrophils Relative %: 48 %
PLATELETS: 349 10*3/uL (ref 140–400)
RBC: 5.88 MIL/uL — ABNORMAL HIGH (ref 4.20–5.80)
RDW: 14 % (ref 11.0–15.0)
WBC: 9.4 10*3/uL (ref 3.8–10.8)

## 2015-12-17 LAB — COMPLETE METABOLIC PANEL WITH GFR
ALT: 33 U/L (ref 9–46)
AST: 24 U/L (ref 10–40)
Albumin: 4.6 g/dL (ref 3.6–5.1)
Alkaline Phosphatase: 44 U/L (ref 40–115)
BILIRUBIN TOTAL: 0.5 mg/dL (ref 0.2–1.2)
BUN: 8 mg/dL (ref 7–25)
CO2: 23 mmol/L (ref 20–31)
CREATININE: 0.94 mg/dL (ref 0.60–1.35)
Calcium: 9.8 mg/dL (ref 8.6–10.3)
Chloride: 101 mmol/L (ref 98–110)
GFR, Est African American: >89 mL/min (ref >=60)
GFR, Est Non African American: >89 mL/min (ref >=60)
GLUCOSE: 83 mg/dL (ref 65–99)
Potassium: 4.2 mmol/L (ref 3.5–5.3)
SODIUM: 137 mmol/L (ref 135–146)
TOTAL PROTEIN: 8 g/dL (ref 6.1–8.1)

## 2015-12-17 LAB — TSH: TSH: 2.16 m[IU]/L (ref 0.40–4.50)

## 2015-12-17 LAB — VITAMIN D 25 HYDROXY (VIT D DEFICIENCY, FRACTURES): Vit D, 25-Hydroxy: 24 ng/mL — ABNORMAL LOW (ref 30–100)

## 2015-12-18 DIAGNOSIS — F988 Other specified behavioral and emotional disorders with onset usually occurring in childhood and adolescence: Secondary | ICD-10-CM | POA: Insufficient documentation

## 2015-12-23 ENCOUNTER — Ambulatory Visit (INDEPENDENT_AMBULATORY_CARE_PROVIDER_SITE_OTHER): Payer: 59

## 2015-12-23 ENCOUNTER — Telehealth: Payer: Self-pay | Admitting: Osteopathic Medicine

## 2015-12-23 DIAGNOSIS — J324 Chronic pansinusitis: Secondary | ICD-10-CM

## 2015-12-23 IMAGING — CT CT MAXILLOFACIAL W/O CM
3 of 4 series · 16 of 47 positions shown, 19 images · non-contrast
Comparison: None.

CLINICAL DATA: Frequent recurrent sinus infection for years.
Throbbing pain. Headache.

EXAM:
CT MAXILLOFACIAL WITHOUT CONTRAST
TECHNIQUE: Multidetector CT imaging of the maxillofacial structures was
performed. Multiplanar CT image reconstructions were also generated.
A small metallic BB was placed on the right temple in order to
reliably differentiate right from left.

[Series 4: coronal bone · coronal · 0.47mm/px · 3 of 80 slices shown]
[im 27/80  bone]
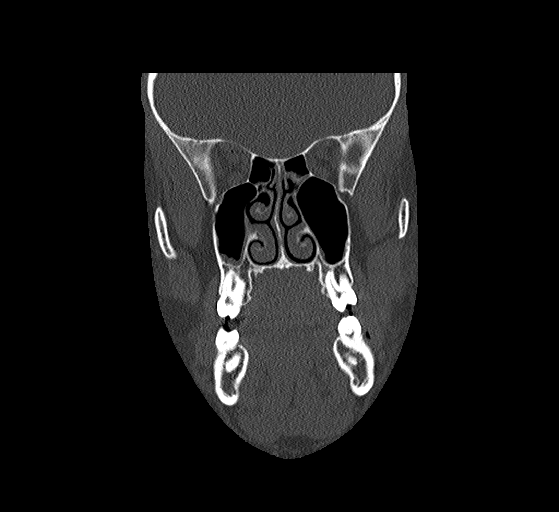
[im 36/80  bone]
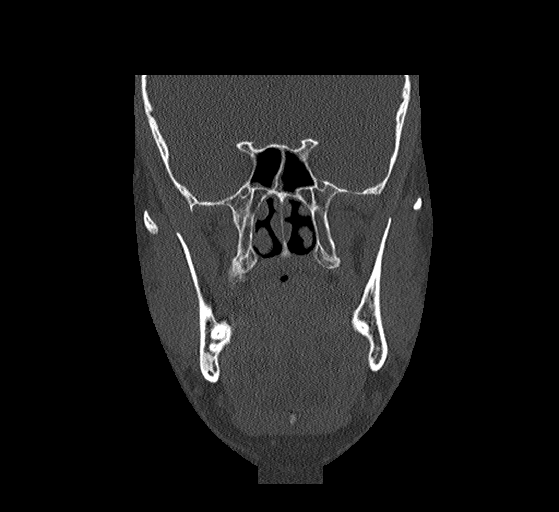
[im 44/80  bone]
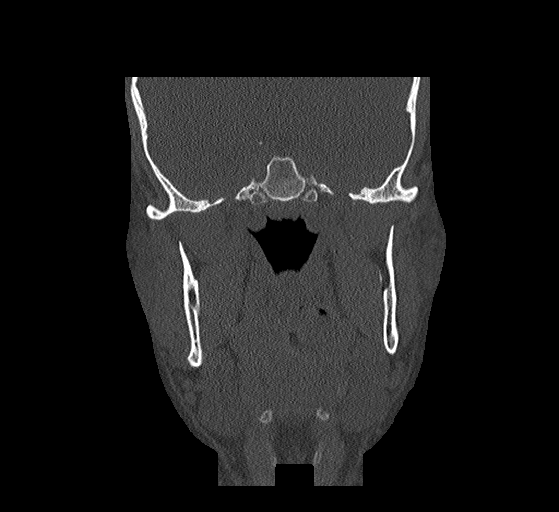

[Series 5: sagittal bone · sagittal · 0.40mm/px · 3 of 87 slices shown]
[im 29/87  bone]
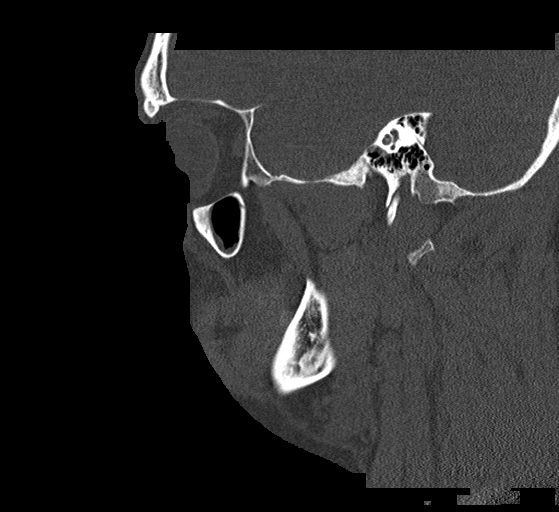
[im 44/87  bone]
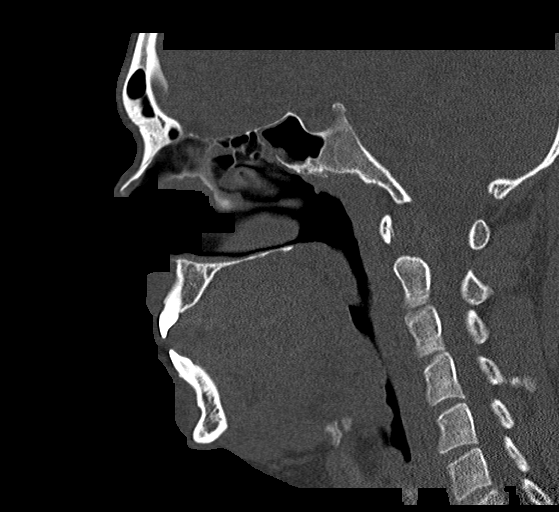
[im 58/87  bone]
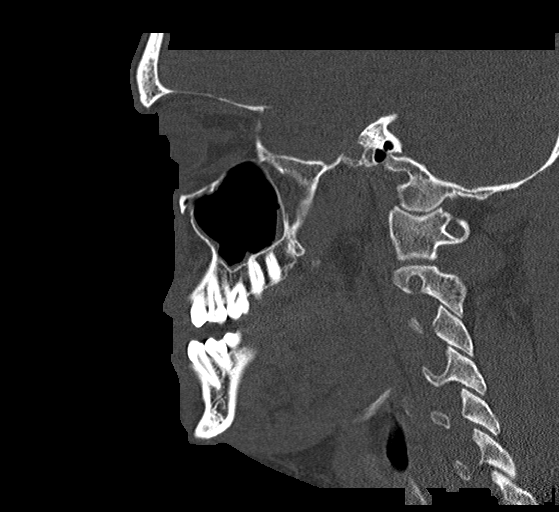

[Series 6: ax bone · axial · 0.34mm/px · z∈[-292,-196]mm · 10 of 60 slices shown, 13 images]
[im 6/60  brain]
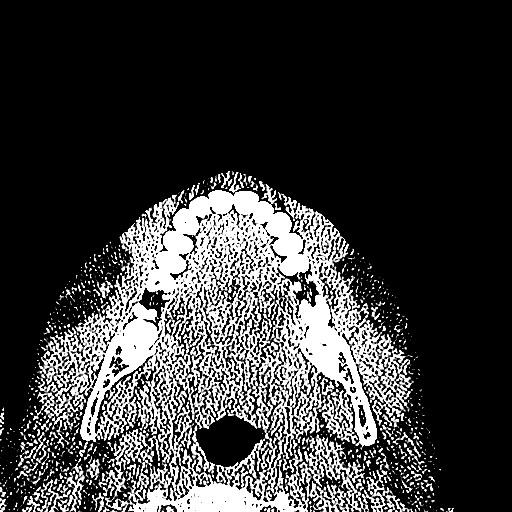
[im 6/60  bone]
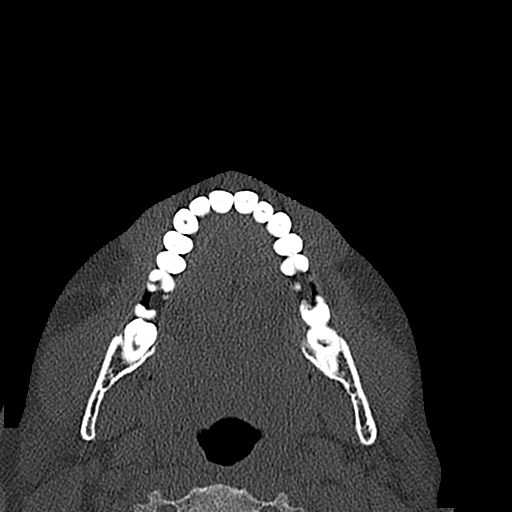
[im 11/60  bone]
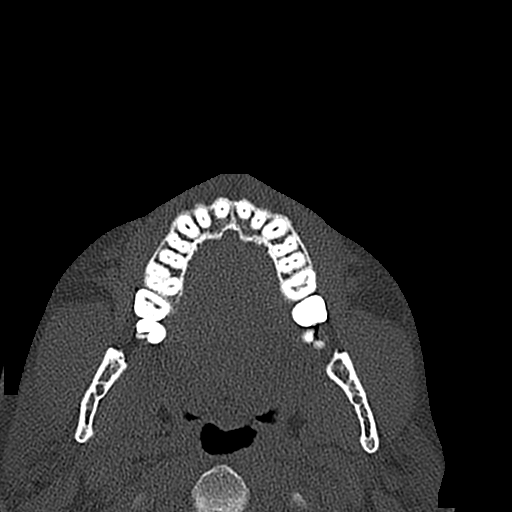
[im 17/60  bone]
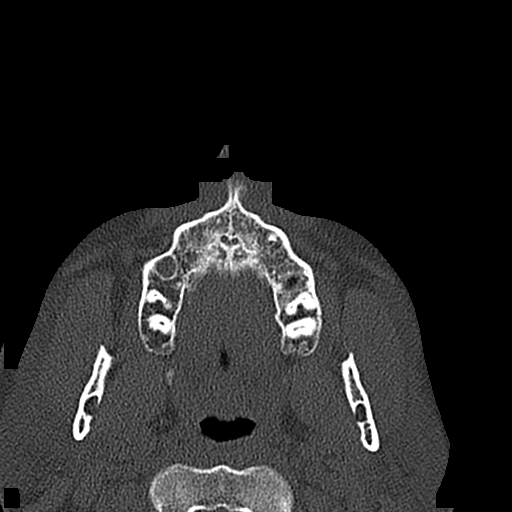
[im 22/60  bone]
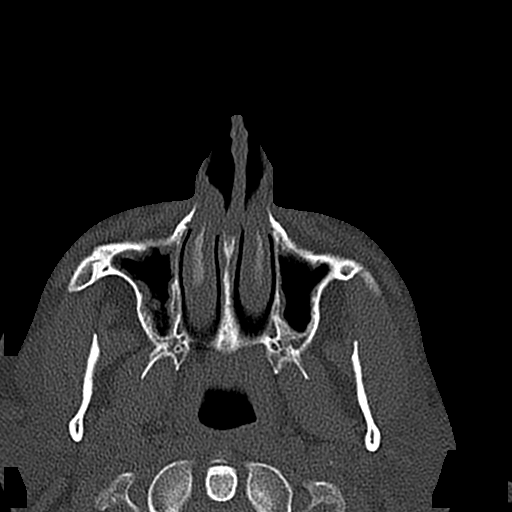
[im 27/60  brain]
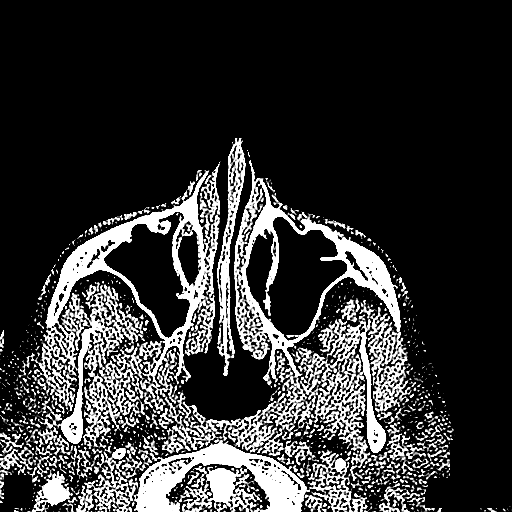
[im 27/60  bone]
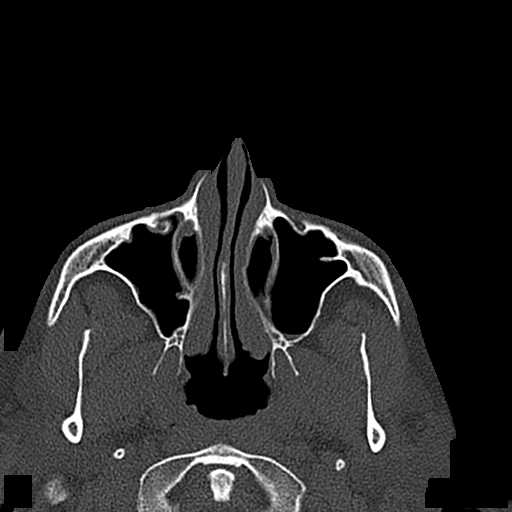
[im 33/60  bone]
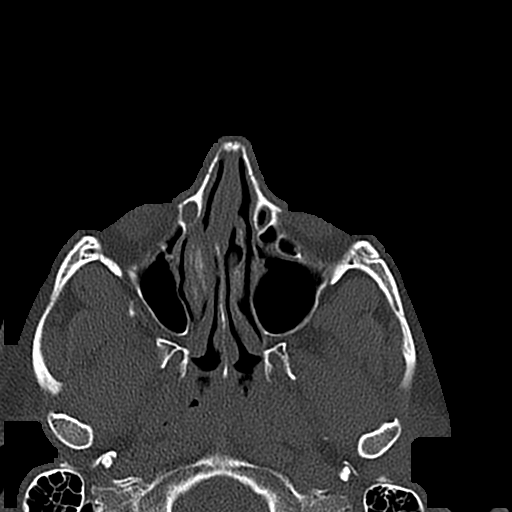
[im 38/60  bone]
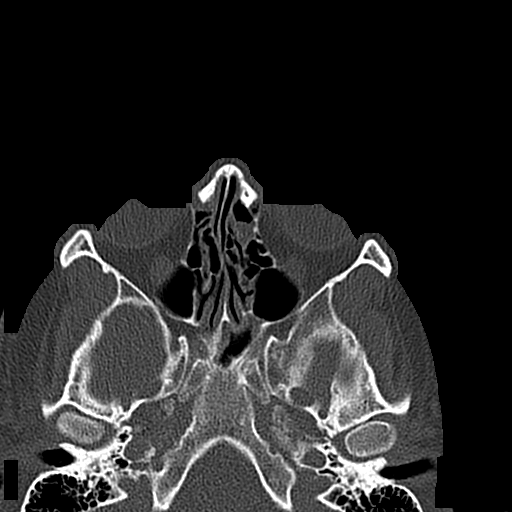
[im 43/60  bone]
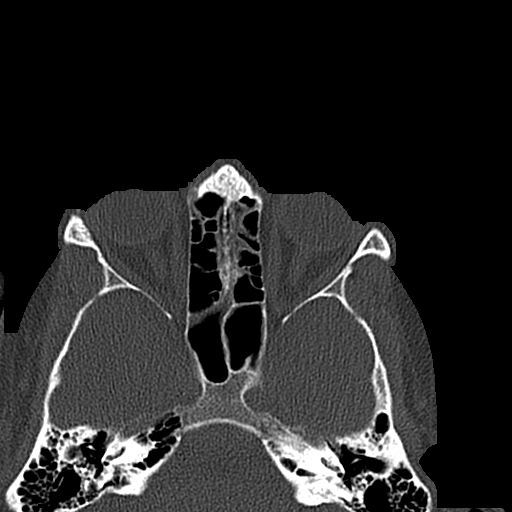
[im 49/60  brain]
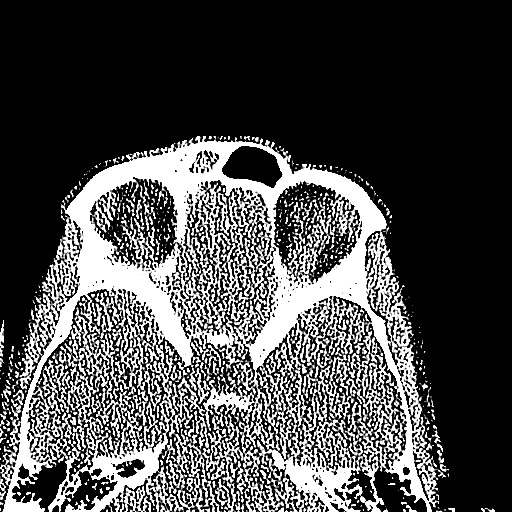
[im 49/60  bone]
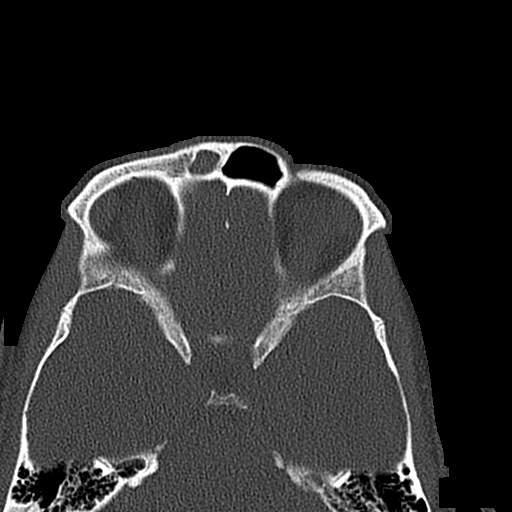
[im 54/60  bone]
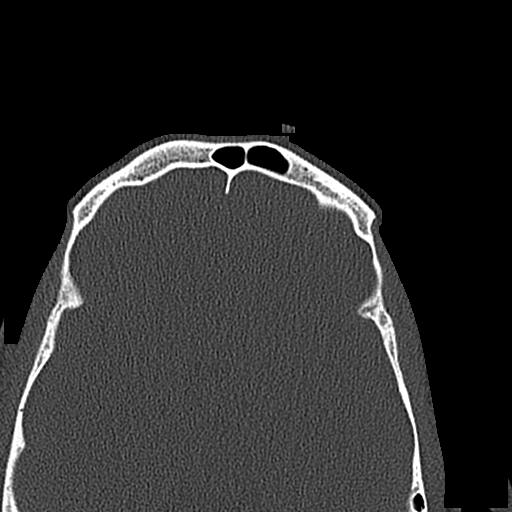

[16 of 47 positions shown; findings below may reference images not displayed]

FINDINGS: Osseous: Hypoplastic RIGHT maxillary sinus. No worrisome osseous
lesion.

Orbits: Negative. No traumatic or inflammatory finding.

Sinuses: Chronic RIGHT maxillary sinusitis. Minimal layering fluid
LEFT maxillary sinus suggesting acuity. BILATERAL anterior and
posterior ethmoid fluid. Mucosal thickening both divisions sphenoid.
Small but completely opacified RIGHT frontal sinus. LEFT frontal
sinus essentially clear.

3 mm nasal septal deviation LEFT to RIGHT. RIGHT infundibular
narrowing. RIGHT middle turbinate concha bullosa.

Soft tissues: Unremarkable.

Limited intracranial: No significant or unexpected finding.
IMPRESSION: Chronic RIGHT paranasal sinus disease, ostiomeatal unit pattern of
obstruction.

Minimal layering fluid LEFT maxillary sinus, uncertain significance.

## 2015-12-23 NOTE — Telephone Encounter (Signed)
Patient asked about prior authorization for [name]. It states his pharmacy has sent [place] the information on this. I figured I would check up on it for him so just as FYI, has been over a week since this Rx was written. No rush, just please make sure that we at least got something from the pharmacy. Thanks!

## 2015-12-23 NOTE — Telephone Encounter (Signed)
Called patient and left a message asking if he has tried and failed any meds for ADHD. This is one of the questions on the form

## 2015-12-23 NOTE — Addendum Note (Signed)
Addended by: Maryla Morrow on: 12/23/2015 02:16 PM   Modules accepted: Orders

## 2015-12-26 NOTE — Telephone Encounter (Signed)
I spoke with the patient on Friday. He states he has mostly been on vyvanse. He tried Focalin which didn't work and he states he may have tried something else but couldn't remember. Will submit PA form to insurance company.

## 2015-12-28 ENCOUNTER — Ambulatory Visit: Payer: 59 | Admitting: Osteopathic Medicine

## 2015-12-28 NOTE — Telephone Encounter (Signed)
Left message on patient's vm and pharm vm

## 2016-01-05 ENCOUNTER — Encounter: Payer: Self-pay | Admitting: Osteopathic Medicine

## 2016-01-05 DIAGNOSIS — J342 Deviated nasal septum: Secondary | ICD-10-CM | POA: Insufficient documentation

## 2016-01-05 HISTORY — DX: Deviated nasal septum: J34.2

## 2016-01-23 ENCOUNTER — Telehealth: Payer: Self-pay

## 2016-01-23 ENCOUNTER — Other Ambulatory Visit: Payer: Self-pay | Admitting: Osteopathic Medicine

## 2016-01-23 DIAGNOSIS — I10 Essential (primary) hypertension: Secondary | ICD-10-CM

## 2016-01-23 NOTE — Telephone Encounter (Signed)
Can try Flonase.  2 sprays in each nostril. Takes about 5 days to work in.    Beatrice Lecher, MD

## 2016-01-23 NOTE — Telephone Encounter (Signed)
Please see recent phone note and advise.

## 2016-01-24 NOTE — Telephone Encounter (Signed)
Recommendations left on vm 

## 2016-02-01 ENCOUNTER — Other Ambulatory Visit: Payer: Self-pay

## 2016-02-01 DIAGNOSIS — F988 Other specified behavioral and emotional disorders with onset usually occurring in childhood and adolescence: Secondary | ICD-10-CM

## 2016-02-01 MED ORDER — LISDEXAMFETAMINE DIMESYLATE 30 MG PO CAPS: 30.0000 mg | ORAL_CAPSULE | ORAL | 0 refills | Status: DC

## 2016-02-09 ENCOUNTER — Telehealth: Payer: Self-pay | Admitting: Osteopathic Medicine

## 2016-02-09 DIAGNOSIS — J302 Other seasonal allergic rhinitis: Secondary | ICD-10-CM

## 2016-02-09 NOTE — Telephone Encounter (Signed)
Pt's wife called requesting a referral for an allergist. Will route.

## 2016-02-09 NOTE — Telephone Encounter (Signed)
Signed. Thanks.

## 2016-03-16 ENCOUNTER — Other Ambulatory Visit: Payer: Self-pay

## 2016-03-16 DIAGNOSIS — F988 Other specified behavioral and emotional disorders with onset usually occurring in childhood and adolescence: Secondary | ICD-10-CM

## 2016-03-16 MED ORDER — LISDEXAMFETAMINE DIMESYLATE 30 MG PO CAPS: 30.0000 mg | ORAL_CAPSULE | ORAL | 0 refills | Status: DC

## 2016-03-27 ENCOUNTER — Ambulatory Visit (INDEPENDENT_AMBULATORY_CARE_PROVIDER_SITE_OTHER): Payer: 59 | Admitting: Osteopathic Medicine

## 2016-03-27 ENCOUNTER — Encounter: Payer: Self-pay | Admitting: Osteopathic Medicine

## 2016-03-27 VITALS — BP 143/92 | HR 87 | Ht 73.0 in | Wt 256.0 lb

## 2016-03-27 DIAGNOSIS — F988 Other specified behavioral and emotional disorders with onset usually occurring in childhood and adolescence: Secondary | ICD-10-CM

## 2016-03-27 DIAGNOSIS — L509 Urticaria, unspecified: Secondary | ICD-10-CM | POA: Diagnosis not present

## 2016-03-27 DIAGNOSIS — J342 Deviated nasal septum: Secondary | ICD-10-CM | POA: Diagnosis not present

## 2016-03-27 DIAGNOSIS — J329 Chronic sinusitis, unspecified: Secondary | ICD-10-CM

## 2016-03-27 MED ORDER — LISDEXAMFETAMINE DIMESYLATE 40 MG PO CAPS: 40.0000 mg | ORAL_CAPSULE | ORAL | 0 refills | Status: DC

## 2016-03-27 NOTE — Progress Notes (Signed)
HPI: Alan Soto. [place] is a 31 y.o. male  who presents to [facility] today, 03/27/16,  for chief complaint of:  Chief Complaint  Patient presents with  . Follow-up    ADHD: following up today, doing well on this medicine, much better than when he was off of the medication. Still feeling Like he is having some difficulty focusing.  Elevated blood pressure: Has been a bit up and down.   Chronic sinusitis/deviated septum: Was recently seen by Zella Ball and throat but he did not feel like he had a comfortable relationship with the specialist who saw him. This person, the patient, did not perform an examination to his satisfaction before just talking about surgical options. He has been doing steroid nasal rinses which have been a bit helpful but patient states he thinks he needs surgery he is just not very comfortable with this surgeon.  Skin: Some concerns for intermittent hives/itching, sometimes on arms, sometimes on face. No new detergents-lotion/soaps. No known exposures. Can happen while he has been sitting at his desk for hours and hasn't gone anywhere and exposed to anything. History of elevated eosinophil count, has been unable to get in to see allergist due to scheduling issues.   Past medical history, surgical history, social history and family history reviewed.  Patient Active Problem List   Diagnosis Date Noted  . Deviated septum 01/05/2016  . Adult attention deficit disorder 12/18/2015  . Eosinophilia 07/22/2015  . Bacterial sinusitis 07/22/2015  . History of ADHD 07/21/2015  . Mild sleep apnea 07/21/2015  . Labral tear of shoulder 07/21/2015  . Testosterone deficiency 06/22/2015  . Chronic fatigue 06/22/2015  . Seasonal allergies 06/22/2015  . Esophageal reflux 06/22/2015  . History of anemia 06/22/2015  . History of cholecystectomy 06/22/2015  . S/P vasectomy 06/22/2015  . Essential hypertension 06/22/2015    Current medication list and  allergy/intolerance information reviewed.   Current Outpatient Prescriptions on File Prior to Visit  Medication Sig Dispense Refill  . azelastine (ASTELIN) 0.1 % nasal spray Place 2 sprays into both nostrils 2 (two) times daily. 30 mL 12  . chlorthalidone (HYGROTON) 25 MG tablet Take 1 tablet (25 mg total) by mouth daily. 90 tablet 0  . Ferrous Sulfate (IRON) 325 (65 Fe) MG TABS Take by mouth 2 (two) times daily.    Marland Kitchen PROAIR HFA 108 (90 Base) MCG/ACT inhaler INHALE 2 PUFFS EVERY 4 TO 6 HOURS AS NEEDED  3  . promethazine (PHENERGAN) 25 MG tablet Take 1 tablet (25 mg total) by mouth every 8 (eight) hours as needed for nausea or vomiting. 20 tablet 0  . ranitidine (ZANTAC) 150 MG tablet Take 150 mg by mouth 2 (two) times daily.    . Vitamin D, Ergocalciferol, (DRISDOL) 50000 units CAPS capsule Take 1 capsule (50,000 Units total) by mouth every 7 (seven) days. Recheck Vitamin D in 3 Months 12 capsule 0   No current facility-administered medications on file prior to visit.    Allergies  Allergen Reactions  . Penicillins Shortness Of Breath      Review of Systems:  Constitutional: No recent illness  HEENT: No  headache, no vision change  Cardiac: No  chest pain, No  pressure, No palpitations  Respiratory:  No  shortness of breath. No  Cough  Gastrointestinal: No  abdominal pain, no change on bowel habits  Musculoskeletal: No new myalgia/arthralgia  Skin: No  Rash  Hem/Onc: No  easy bruising/bleeding, No  abnormal lumps/bumps  Neurologic:  No  weakness, No  Dizziness  Psychiatric: No  concerns with depression, No  concerns with anxiety  Exam:  BP (!) 143/92   Pulse 87   Ht 6\' 1"  (1.854 m)   Wt 256 lb (116.1 kg)   BMI 33.78 kg/m   Constitutional: VS see above. General Appearance: alert, well-developed, well-nourished, NAD  Eyes: Normal lids and conjunctive, non-icteric sclera  Ears, Nose, Mouth, Throat: MMM, Normal external inspection ears/nares/mouth/lips/gums.  Neck:  No masses, trachea midline.   Respiratory: Normal respiratory effort. no wheeze, no rhonchi, no rales  Cardiovascular: S1/S2 normal, no murmur, no rub/gallop auscultated. RRR.   Musculoskeletal: Gait normal. Symmetric and independent movement of all extremities  Neurological: Normal balance/coordination. No tremor.  Skin: warm, dry, intact.   Psychiatric: Normal judgment/insight. Normal mood and affect. Oriented x3.       ASSESSMENT/PLAN:   Adult attention deficit disorder - Trial increased dose [name], if BP elevated despite optimization diet/exercise, we'll need to increase medications - Plan: lisdexamfetamine ([name]) 40 MG capsule  Deviated septum - second opinion from ENT requested, referral placed - Plan: Ambulatory referral to ENT  Chronic sinusitis, unspecified location - request second opinion from ENT, referral placed - Plan: Ambulatory referral to ENT  Urticaria - Trial second gen antihistmine (Claritin or similar/generic) and see how this works, needs to see allergist as well      Follow-up plan: Return in about 3 months (around 06/25/2016) for medication (ADHD) followup, sooner if needed .  Visit summary with medication list and pertinent instructions was printed for patient to review, alert [place] if any changes needed. All questions at time of visit were answered - patient instructed to contact office with any additional concerns. ER/RTC precautions were reviewed with the patient and understanding verbalized.

## 2016-04-22 ENCOUNTER — Other Ambulatory Visit: Payer: Self-pay | Admitting: Osteopathic Medicine

## 2016-04-22 DIAGNOSIS — I10 Essential (primary) hypertension: Secondary | ICD-10-CM

## 2016-05-15 ENCOUNTER — Telehealth: Payer: Self-pay

## 2016-05-15 DIAGNOSIS — F988 Other specified behavioral and emotional disorders with onset usually occurring in childhood and adolescence: Secondary | ICD-10-CM

## 2016-05-15 MED ORDER — LISDEXAMFETAMINE DIMESYLATE 40 MG PO CAPS: 40.0000 mg | ORAL_CAPSULE | ORAL | 0 refills | Status: DC

## 2016-05-15 NOTE — Telephone Encounter (Signed)
Patient request refill for Vyance. # 30 [phone] refills has been approved and patient wife will pick up. Rhonda Cunningham,CMA

## 2016-05-16 ENCOUNTER — Telehealth: Payer: Self-pay | Admitting: Osteopathic Medicine

## 2016-05-16 DIAGNOSIS — F988 Other specified behavioral and emotional disorders with onset usually occurring in childhood and adolescence: Secondary | ICD-10-CM

## 2016-05-16 MED ORDER — LISDEXAMFETAMINE DIMESYLATE 40 MG PO CAPS: 40.0000 mg | ORAL_CAPSULE | ORAL | 0 refills | Status: DC

## 2016-05-21 NOTE — Telephone Encounter (Signed)
Description, my mistakes, was found later. Refill given to patient

## 2016-05-26 ENCOUNTER — Other Ambulatory Visit: Payer: Self-pay | Admitting: Osteopathic Medicine

## 2016-05-26 DIAGNOSIS — I10 Essential (primary) hypertension: Secondary | ICD-10-CM

## 2016-06-12 ENCOUNTER — Telehealth: Payer: Self-pay

## 2016-06-12 DIAGNOSIS — F988 Other specified behavioral and emotional disorders with onset usually occurring in childhood and adolescence: Secondary | ICD-10-CM

## 2016-06-12 MED ORDER — LISDEXAMFETAMINE DIMESYLATE 40 MG PO CAPS: 40.0000 mg | ORAL_CAPSULE | ORAL | 0 refills | Status: DC

## 2016-06-12 NOTE — Telephone Encounter (Signed)
ATIENT REQUEST REFILL FOR vYVANCE 40 MG. #30 0 REFILLS WERE APPROVED AND PATIENT SPOUSE BEEN INFORMED THAT RX IS READY FOR PICKUP. Dylyn Mclaren,CMA

## 2016-06-27 ENCOUNTER — Other Ambulatory Visit: Payer: Self-pay | Admitting: Osteopathic Medicine

## 2016-06-27 DIAGNOSIS — I10 Essential (primary) hypertension: Secondary | ICD-10-CM

## 2016-06-29 ENCOUNTER — Telehealth: Payer: Self-pay

## 2016-06-29 NOTE — Telephone Encounter (Signed)
Left detailed message on patient vm advising of this information. Advised patient to call the office at the main number and schedule an appointment. Rhonda Cunningham,CMA

## 2016-06-29 NOTE — Telephone Encounter (Signed)
Last OV in 02/2016 reviewed. He needs follow up for ADHD meds and BP check to continue those meds.

## 2016-06-29 NOTE — Telephone Encounter (Signed)
Patient called requesting more refills on Hygroton 25 mg. #15 [phone] refills sent to pharmacy on 06/28/2016.Marland Kitchen Patient was advised multiple times that appointment is needed for further refills. Please advise if more refills are approved.  Last labs done 12/16/2015.Rhonda Cunningham,CMA

## 2016-07-18 ENCOUNTER — Telehealth: Payer: Self-pay

## 2016-07-18 DIAGNOSIS — F988 Other specified behavioral and emotional disorders with onset usually occurring in childhood and adolescence: Secondary | ICD-10-CM

## 2016-07-18 NOTE — Telephone Encounter (Signed)
ERROR. Rhonda Cunningham,CMA  

## 2016-07-19 ENCOUNTER — Encounter: Payer: Self-pay | Admitting: Osteopathic Medicine

## 2016-07-19 ENCOUNTER — Ambulatory Visit (INDEPENDENT_AMBULATORY_CARE_PROVIDER_SITE_OTHER): Payer: 59 | Admitting: Osteopathic Medicine

## 2016-07-19 VITALS — BP 148/89 | HR 68 | Temp 97.5°F | Ht 73.0 in | Wt 268.0 lb

## 2016-07-19 DIAGNOSIS — I10 Essential (primary) hypertension: Secondary | ICD-10-CM

## 2016-07-19 DIAGNOSIS — J329 Chronic sinusitis, unspecified: Secondary | ICD-10-CM

## 2016-07-19 DIAGNOSIS — F988 Other specified behavioral and emotional disorders with onset usually occurring in childhood and adolescence: Secondary | ICD-10-CM

## 2016-07-19 MED ORDER — CHLORTHALIDONE 25 MG PO TABS: 25.0000 mg | ORAL_TABLET | Freq: Every day | ORAL | 3 refills | Status: DC

## 2016-07-19 MED ORDER — LISDEXAMFETAMINE DIMESYLATE 40 MG PO CAPS: 40.0000 mg | ORAL_CAPSULE | ORAL | 0 refills | Status: DC

## 2016-07-19 NOTE — Progress Notes (Signed)
HPI: Alan Soto. [place] is a 31 y.o. male  who presents to [facility] today, 07/19/16,  for chief complaint of:  Chief Complaint  Patient presents with  . Follow-up    ADHD/ BLOOD PRESSURE  . Sinus Problem    ADHD: following up today, doing well on this medicine, much better than when he was off of the medication. Still feeling like he is having some difficulty focusing.  Elevated blood pressure: initial elevation in office, recheck w/ manual cuff same. No CP/HA/SOB/VC. Has been out of medications for about 5 days or so  Sinusitis/Allergies: Zyrtec, Benadryl, antihistamine nasal spray. Hasn't really been helping. Has had to reschedule ENT visit multiple times due to travel for work   Past medical history, surgical history, social history and family history reviewed.  Patient Active Problem List   Diagnosis Date Noted  . Deviated septum 01/05/2016  . Adult attention deficit disorder 12/18/2015  . Eosinophilia 07/22/2015  . History of ADHD 07/21/2015  . Mild sleep apnea 07/21/2015  . Labral tear of shoulder 07/21/2015  . Testosterone deficiency 06/22/2015  . Chronic fatigue 06/22/2015  . Seasonal allergies 06/22/2015  . Esophageal reflux 06/22/2015  . History of anemia 06/22/2015  . History of cholecystectomy 06/22/2015  . S/P vasectomy 06/22/2015  . Essential hypertension 06/22/2015    Current medication list and allergy/intolerance information reviewed.   Current Outpatient Prescriptions on File Prior to Visit  Medication Sig Dispense Refill  . azelastine (ASTELIN) 0.1 % nasal spray Place 2 sprays into both nostrils 2 (two) times daily. 30 mL 12  . budesonide (PULMICORT) 0.5 MG/2ML nebulizer solution ADD 1 VIAL OF MEDICATION TO 240ML OF SALINE IN RINSE BOTTLE. IRRIGATE SINUSES WITH 120ML THROUGH EACH NOSTRIL TWICE DAILY FOR 3 WEEKS, DAILY  1  . chlorthalidone (HYGROTON) 25 MG tablet TAKE ONE TABLET BY MOUTH DAILY APPT. NEEDED FOR MORE  REFILLS 15 tablet 0  . Ferrous Sulfate (IRON) 325 (65 Fe) MG TABS Take by mouth 2 (two) times daily.    Marland Kitchen lisdexamfetamine ([name]) 40 MG capsule Take 1 capsule (40 mg total) by mouth every morning. 30 capsule 0  . PROAIR HFA 108 (90 Base) MCG/ACT inhaler INHALE 2 PUFFS EVERY 4 TO 6 HOURS AS NEEDED  3  . promethazine (PHENERGAN) 25 MG tablet Take 1 tablet (25 mg total) by mouth every 8 (eight) hours as needed for nausea or vomiting. 20 tablet 0  . ranitidine (ZANTAC) 150 MG tablet Take 150 mg by mouth 2 (two) times daily.    . Vitamin D, Ergocalciferol, (DRISDOL) 50000 units CAPS capsule Take 1 capsule (50,000 Units total) by mouth every 7 (seven) days. Recheck Vitamin D in 3 Months 12 capsule 0   No current facility-administered medications on file prior to visit.    Allergies  Allergen Reactions  . Penicillins Shortness Of Breath      Review of Systems:  Constitutional: No recent illness  HEENT: No  headache, no vision change  Cardiac: No  chest pain, No  pressure, No palpitations  Respiratory:  No  shortness of breath. No  Cough  Exam:  BP (!) 148/89   Pulse 68   Temp 97.5 F (36.4 C) (Oral)   Ht 6\' 1"  (1.854 m)   Wt 268 lb (121.6 kg)   BMI 35.36 kg/m   Constitutional: VS see above. General Appearance: alert, well-developed, well-nourished, NAD  Eyes: Normal lids and conjunctive, non-icteric sclera  Ears, Nose, Mouth, Throat: MMM, Normal external inspection  ears/nares/mouth/lips/gums.  Neck: No masses, trachea midline.   Respiratory: Normal respiratory effort. no wheeze, no rhonchi, no rales  Cardiovascular: S1/S2 normal, no murmur, no rub/gallop auscultated. RRR.   Musculoskeletal: Gait normal. Symmetric and independent movement of all extremities  Neurological: Normal balance/coordination. No tremor.  Skin: warm, dry, intact.   Psychiatric: Normal judgment/insight. Normal mood and affect. Oriented x3.       ASSESSMENT/PLAN:   Adult attention deficit  disorder - Plan: lisdexamfetamine ([name]) 40 MG capsule  Essential hypertension - Need to get back on medications, bring home blood pressure cuff next visit  Recurrent sinusitis - Has had to reschedule ENT follow-up multiple times, patient advised to get in to see specialist   Patient Instructions  [facility]: [phone]  3 months [name] prescribed 1 year BP medication prescribed Plan to bring your home BP monitor to your next office visit  Please call with any additional questions/concerns!    Follow-up plan: Return in about 3 months (around 10/19/2016) for  .  Visit summary with medication list and pertinent instructions was printed for patient to review, alert [place] if any changes needed. All questions at time of visit were answered - patient instructed to contact office with any additional concerns. ER/RTC precautions were reviewed with the patient and understanding verbalized.

## 2016-07-19 NOTE — Patient Instructions (Addendum)
[facility]: [phone]  3 months [name] prescribed 1 year BP medication prescribed Plan to bring your home BP monitor to your next office visit  Please call with any additional questions/concerns!

## 2016-07-25 ENCOUNTER — Other Ambulatory Visit: Payer: Self-pay

## 2016-07-25 NOTE — Telephone Encounter (Signed)
Opened in error. Alan Soto,CMA  

## 2016-09-24 ENCOUNTER — Ambulatory Visit: Payer: 59 | Admitting: Osteopathic Medicine

## 2016-09-26 ENCOUNTER — Encounter: Payer: Self-pay | Admitting: Osteopathic Medicine

## 2016-09-26 ENCOUNTER — Ambulatory Visit (INDEPENDENT_AMBULATORY_CARE_PROVIDER_SITE_OTHER): Payer: 59 | Admitting: Osteopathic Medicine

## 2016-09-26 ENCOUNTER — Telehealth: Payer: Self-pay

## 2016-09-26 VITALS — BP 146/90 | HR 61 | Temp 98.1°F | Ht 73.0 in | Wt 263.0 lb

## 2016-09-26 DIAGNOSIS — R238 Other skin changes: Secondary | ICD-10-CM | POA: Diagnosis not present

## 2016-09-26 DIAGNOSIS — J329 Chronic sinusitis, unspecified: Secondary | ICD-10-CM

## 2016-09-26 DIAGNOSIS — R05 Cough: Secondary | ICD-10-CM

## 2016-09-26 DIAGNOSIS — R059 Cough, unspecified: Secondary | ICD-10-CM

## 2016-09-26 MED ORDER — KETOCONAZOLE 2 % EX SHAM: 1.0000 "application " | MEDICATED_SHAMPOO | CUTANEOUS | 0 refills | Status: DC

## 2016-09-26 MED ORDER — GUAIFENESIN-CODEINE 100-10 MG/5ML PO SYRP: 5.0000 mL | ORAL_SOLUTION | Freq: Four times a day (QID) | ORAL | 0 refills | Status: DC | PRN

## 2016-09-26 MED ORDER — DOXYCYCLINE MONOHYDRATE 100 MG PO CAPS: 100.0000 mg | ORAL_CAPSULE | Freq: Two times a day (BID) | ORAL | 0 refills | Status: DC

## 2016-09-26 MED ORDER — DOXYCYCLINE MONOHYDRATE 50 MG PO CAPS: 100.0000 mg | ORAL_CAPSULE | Freq: Two times a day (BID) | ORAL | 0 refills | Status: DC

## 2016-09-26 NOTE — Addendum Note (Signed)
Addended by: Maryla Morrow on: 09/26/2016 12:41 PM   Modules accepted: Orders

## 2016-09-26 NOTE — Progress Notes (Signed)
HPI: Alan Soto is a 31 y.o. male who presents to [facility] 09/26/16 for chief complaint of:  Chief Complaint  Patient presents with  . Sinusitis    Acute Illness: . Context: Hx frequent and recurrent sinusitis, needs to schedule surgery w/ ENT but work schedule has been overwhelming and he hasn't been able to take time off   . Location & Quality: Sinus congestion, runny nose, . Assoc signs/symptoms: see ROS . Duration: 3 weeks+ . Modifying factors: has tried the following OTC/Rx medications: Sudafed, DayQuil, NyQuil   Allergies: used to get steroid shots (not immunotherapy) at previous PCP, would like to try this today   Scalp itching: previously prescribed what sounds like ketoconazole shampoo, requests refill of this    Past medical, social and family history reviewed.   Immune compromising conditions or other risk factors: recurrent sinusitis   Current medications and allergies reviewed.     Review of Systems:  Constitutional: No  fever/chills  HEENT: Yes  headache, Yes  sore throat, No  swollen glands  Cardiovascular: No chest pain  Respiratory:Yes  cough, No  shortness of breath  Gastrointestinal: No  nausea, No  vomiting,  No  diarrhea  Musculoskeletal:   Yes  myalgia/arthralgia  Skin/Integument:  No  rash   Detailed Exam:  BP (!) 146/90   Pulse 61   Temp 98.1 F (36.7 C) (Oral)   Ht 6\' 1"  (1.854 m)   Wt 263 lb (119.3 kg)   BMI 34.70 kg/m   Constitutional:   VSS, see above.   General Appearance: alert, well-developed, well-nourished, NAD  Eyes:   Normal lids and conjunctive, non-icteric sclera  Ears, Nose, Mouth, Throat:   Normal external inspection ears/nares  Normal mouth/lips/gums, MMM  posterior pharynx without erythema, without exudate  nasal mucosa normal  Skin:  Normal inspection, no rash or concerning lesions noted on limited exam  Scalp flaking and mild redness at base of  hairline  Neck:   No masses, trachea midline. normal lymph nodes  Respiratory:   Normal respiratory effort.   No  wheeze/rhonchi/rales  Cardiovascular:   S1/S2 normal, no murmur/rub/gallop auscultated. RRR.   ASSESSMENT/PLAN: Lung exam good today, RTC if cough persists, may need CXR   Recurrent sinusitis - Plan: doxycycline (MONODOX) 50 MG capsule  Chronic sinusitis, unspecified location - Plan: doxycycline (MONODOX) 50 MG capsule  Cough - Plan: guaiFENesin-codeine (ROBITUSSIN AC) 100-10 MG/5ML syrup  Scalp irritation - Plan: ketoconazole (NIZORAL) 2 % shampoo     Patient Instructions  Plan:  Cough medicine and antibiotics  Steroid shot in office today  Schedule that surgery!   Shampoo prescription sent as well  See me when due for routine ADHD follow-up unless you need me sooner!     Visit summary was printed for the patient with medications and pertinent instructions for patient to review. ER/RTC precautions reviewed. All questions answered. Return if symptoms worsen or fail to improve, and as directed for routine followup .

## 2016-09-26 NOTE — Telephone Encounter (Signed)
Doxycycline Rx says take 2 cap BID for one week but was only dispense with 14. Is he suppose to take it once daily or BID. Please advise. -EH/RMA

## 2016-09-26 NOTE — Telephone Encounter (Signed)
I'll fix it should be 100 mg one po bid Disp #14

## 2016-09-26 NOTE — Telephone Encounter (Signed)
Pharmacy notified -EH/RMA

## 2016-09-26 NOTE — Patient Instructions (Signed)
Plan:  Cough medicine and antibiotics  Steroid shot in office today  Schedule that surgery!   Shampoo prescription sent as well  See me when due for routine ADHD follow-up unless you need me sooner!

## 2016-11-02 ENCOUNTER — Other Ambulatory Visit: Payer: Self-pay

## 2016-11-02 ENCOUNTER — Telehealth: Payer: Self-pay

## 2016-11-02 DIAGNOSIS — F988 Other specified behavioral and emotional disorders with onset usually occurring in childhood and adolescence: Secondary | ICD-10-CM

## 2016-11-02 MED ORDER — LISDEXAMFETAMINE DIMESYLATE 40 MG PO CAPS: 40.0000 mg | ORAL_CAPSULE | ORAL | 0 refills | Status: DC

## 2016-11-02 NOTE — Telephone Encounter (Signed)
Pt's wife called and requested a refill on vyvanse.  I printed it and put in your box for signature,

## 2016-11-30 ENCOUNTER — Telehealth: Payer: Self-pay | Admitting: Osteopathic Medicine

## 2016-11-30 DIAGNOSIS — F988 Other specified behavioral and emotional disorders with onset usually occurring in childhood and adolescence: Secondary | ICD-10-CM

## 2016-11-30 MED ORDER — LISDEXAMFETAMINE DIMESYLATE 40 MG PO CAPS: 40.0000 mg | ORAL_CAPSULE | ORAL | 0 refills | Status: DC

## 2016-11-30 NOTE — Telephone Encounter (Signed)
Rx has been placed in Dr. Darene Lamer basket for signature. Patient must keep appointment for further refills. Arie Gable,CMA

## 2016-11-30 NOTE — Telephone Encounter (Signed)
Pt is requesting a refill for vyvanse scheduled an appt with Dr. Sheppard Coil for 12/06/16 when she returns but need a refill is completley out and if you can call Cora Stetson [phone] when ready to pick up. Thanks

## 2016-12-06 ENCOUNTER — Ambulatory Visit: Payer: 59 | Admitting: Osteopathic Medicine

## 2016-12-12 ENCOUNTER — Encounter: Payer: Self-pay | Admitting: Osteopathic Medicine

## 2016-12-12 ENCOUNTER — Ambulatory Visit (INDEPENDENT_AMBULATORY_CARE_PROVIDER_SITE_OTHER): Payer: 59 | Admitting: Osteopathic Medicine

## 2016-12-12 VITALS — BP 133/83 | HR 63 | Wt 244.0 lb

## 2016-12-12 DIAGNOSIS — F988 Other specified behavioral and emotional disorders with onset usually occurring in childhood and adolescence: Secondary | ICD-10-CM | POA: Diagnosis not present

## 2016-12-12 DIAGNOSIS — I10 Essential (primary) hypertension: Secondary | ICD-10-CM | POA: Diagnosis not present

## 2016-12-12 MED ORDER — LISDEXAMFETAMINE DIMESYLATE 40 MG PO CAPS: 40.0000 mg | ORAL_CAPSULE | ORAL | 0 refills | Status: DC

## 2016-12-12 MED ORDER — LISDEXAMFETAMINE DIMESYLATE 40 MG PO CAPS
40.0000 mg | ORAL_CAPSULE | ORAL | 0 refills | Status: DC
Start: 2016-12-12 — End: 2016-12-12

## 2016-12-12 MED ORDER — LISDEXAMFETAMINE DIMESYLATE 40 MG PO CAPS
40.0000 mg | ORAL_CAPSULE | ORAL | 0 refills | Status: DC
Start: 2017-02-10 — End: 2016-12-12

## 2016-12-12 NOTE — Progress Notes (Signed)
HPI: Alan Soto. [place] is a 31 y.o. male  who presents to [facility] today, 12/12/16,  for chief complaint of:  Chief Complaint  Patient presents with  . Follow-up    ADD    Attention deficit disorder: Doing well on current dose of [name]. No chest pain, palpitation.  Intentional weight loss: Has been going to the gym early morning on most days, doing a lot of boxing and running. Has lost about 30 pounds. Has been off of his blood pressure medicines for about a month, BP as below is very close to goal.  Reports some nausea/fatigue midway through workout, he is not taking water or really consuming any food prior to workouts. He has been avoiding eating late at night.    Past medical history, surgical history, social history and family history reviewed.  Patient Active Problem List   Diagnosis Date Noted  . Scalp irritation 09/26/2016  . Recurrent sinusitis 09/26/2016  . Deviated septum 01/05/2016  . Adult attention deficit disorder 12/18/2015  . Eosinophilia 07/22/2015  . History of ADHD 07/21/2015  . Mild sleep apnea 07/21/2015  . Labral tear of shoulder 07/21/2015  . Testosterone deficiency 06/22/2015  . Chronic fatigue 06/22/2015  . Seasonal allergies 06/22/2015  . Esophageal reflux 06/22/2015  . History of anemia 06/22/2015  . History of cholecystectomy 06/22/2015  . S/P vasectomy 06/22/2015  . Essential hypertension 06/22/2015    Current medication list and allergy/intolerance information reviewed.   Current Outpatient Prescriptions on File Prior to Visit  Medication Sig Dispense Refill  . azelastine (ASTELIN) 0.1 % nasal spray Place 2 sprays into both nostrils 2 (two) times daily. 30 mL 12  . chlorthalidone (HYGROTON) 25 MG tablet Take 1 tablet (25 mg total) by mouth daily. 90 tablet 3  . ketoconazole (NIZORAL) 2 % shampoo Apply 1 application topically 2 (two) times a week. 120 mL 0  . lisdexamfetamine ([name]) 40 MG capsule Take  1 capsule (40 mg total) by mouth every morning. MUST KEEP APPOINTMENT FOR FURTHER REFILLS 30 capsule 0  . PROAIR HFA 108 (90 Base) MCG/ACT inhaler INHALE 2 PUFFS EVERY 4 TO 6 HOURS AS NEEDED  3  . ranitidine (ZANTAC) 150 MG tablet Take 150 mg by mouth 2 (two) times daily.    . Vitamin D, Ergocalciferol, (DRISDOL) 50000 units CAPS capsule Take 1 capsule (50,000 Units total) by mouth every 7 (seven) days. Recheck Vitamin D in 3 Months 12 capsule 0   No current facility-administered medications on file prior to visit.    Allergies  Allergen Reactions  . Penicillins Shortness Of Breath      Review of Systems:  Constitutional: No recent illness  HEENT: No  headache, no vision change  Cardiac: No  chest pain, No  pressure, No palpitations  Respiratory:  No  shortness of breath. No  Cough  Gastrointestinal: No  abdominal pain   Exam:  BP 133/83   Pulse 63   Wt 244 lb (110.7 kg)   BMI 32.19 kg/m   Constitutional: VS see above. General Appearance: alert, well-developed, well-nourished, NAD  Eyes: Normal lids and conjunctive, non-icteric sclera  Ears, Nose, Mouth, Throat: MMM, Normal external inspection ears/nares/mouth/lips/gums.  Neck: No masses, trachea midline.   Respiratory: Normal respiratory effort. no wheeze, no rhonchi, no rales  Cardiovascular: S1/S2 normal, no murmur, no rub/gallop auscultated. RRR.   Musculoskeletal: Gait normal. Symmetric and independent movement of all extremities  Neurological: Normal balance/coordination. No tremor.  Skin: warm, dry, intact.  Psychiatric: Normal judgment/insight. Normal mood and affect. Oriented x3.    ASSESSMENT/PLAN:   Adult attention deficit disorder - Plan: lisdexamfetamine ([name]) 40 MG capsule, lisdexamfetamine ([name]) 40 MG capsule, DISCONTINUED: lisdexamfetamine ([name]) 40 MG capsule, DISCONTINUED: lisdexamfetamine ([name]) 40 MG capsule, DISCONTINUED: lisdexamfetamine ([name]) 40 MG capsule,  DISCONTINUED: lisdexamfetamine ([name]) 40 MG capsule  Essential hypertension - Doing well with lifestyle modifications, I'm okay to take chlorthalidone off med list for now, monitor closely. Counseled on nutrition prior to exercise     Follow-up plan: Return in about 3 months (around 03/14/2017) for REFILL ADHD MEDICATIONS .  Visit summary with medication list and pertinent instructions was printed for patient to review, alert [place] if any changes needed. All questions at time of visit were answered - patient instructed to contact office with any additional concerns. ER/RTC precautions were reviewed with the patient and understanding verbalized.   Note: Total time spent 15 minutes, greater than 50% of the visit was spent face-to-face counseling and coordinating care for the following: The primary encounter diagnosis was Adult attention deficit disorder. A diagnosis of Essential hypertension was also pertinent to this visit..  Established: 03159- 15 / 99214- 25 / [place]- 40 New: 45859- 61 / 29244- 66 / 99205- 8

## 2017-01-11 ENCOUNTER — Telehealth: Payer: Self-pay | Admitting: Osteopathic Medicine

## 2017-01-11 NOTE — Telephone Encounter (Signed)
Received fax for PA on [name] sent through cover my meds and received authorization from 01-11-17 to 01/11/18. I will notify the pharmacy.   File ID: [id number]

## 2017-03-12 ENCOUNTER — Ambulatory Visit (INDEPENDENT_AMBULATORY_CARE_PROVIDER_SITE_OTHER): Payer: Managed Care, Other (non HMO) | Admitting: Osteopathic Medicine

## 2017-03-12 ENCOUNTER — Encounter: Payer: Self-pay | Admitting: Osteopathic Medicine

## 2017-03-12 VITALS — BP 136/79 | HR 57 | Wt 232.0 lb

## 2017-03-12 DIAGNOSIS — Z87898 Personal history of other specified conditions: Secondary | ICD-10-CM

## 2017-03-12 DIAGNOSIS — R7989 Other specified abnormal findings of blood chemistry: Secondary | ICD-10-CM

## 2017-03-12 DIAGNOSIS — J329 Chronic sinusitis, unspecified: Secondary | ICD-10-CM | POA: Diagnosis not present

## 2017-03-12 DIAGNOSIS — Z13220 Encounter for screening for lipoid disorders: Secondary | ICD-10-CM | POA: Diagnosis not present

## 2017-03-12 DIAGNOSIS — F988 Other specified behavioral and emotional disorders with onset usually occurring in childhood and adolescence: Secondary | ICD-10-CM | POA: Diagnosis not present

## 2017-03-12 MED ORDER — AMPHETAMINE-DEXTROAMPHET ER 15 MG PO CP24: 15.0000 mg | ORAL_CAPSULE | ORAL | 0 refills | Status: DC

## 2017-03-12 MED ORDER — METHYLPREDNISOLONE SODIUM SUCC 125 MG IJ SOLR
125.0000 mg | Freq: Once | INTRAMUSCULAR | Status: AC
  Administered 2017-03-12: 125 mg via INTRAMUSCULAR

## 2017-03-12 NOTE — Patient Instructions (Signed)
Stop vyvanse, trial Adderall XR, let me know if any problems with cost/coverage, or if not working or if any problems with side effects, come see me and bring pill bottle with you so we can trade it out.

## 2017-03-12 NOTE — Progress Notes (Signed)
HPI: Alan Soto. Alan Soto is a 32 y.o. male  who presents to [place] today, 03/12/17,  for chief complaint of:  Chief Complaint  Patient presents with  . Follow-up - attention deficit disorder. New problem: Skin issue     Attention deficit disorder: No chest pain, palpitation, insomnia, unintentional weight loss. Recent insurance change so [name] is no longer covered, would like to discuss alternatives. Pharmacy let him know that are all would be a covered medication.  Skin issues: stye/boil on R lower lid. Present few days, no drainage or vision problems.   Sinus: work schedule has precluded surgical intervention. Requests steroid shot today.       Past medical history, surgical history, social history and family history reviewed.  Patient Active Problem List   Diagnosis Date Noted  . Scalp irritation 09/26/2016  . Recurrent sinusitis 09/26/2016  . Deviated septum 01/05/2016  . Adult attention deficit disorder 12/18/2015  . Eosinophilia 07/22/2015  . History of ADHD 07/21/2015  . Mild sleep apnea 07/21/2015  . Labral tear of shoulder 07/21/2015  . Testosterone deficiency 06/22/2015  . Chronic fatigue 06/22/2015  . Seasonal allergies 06/22/2015  . Esophageal reflux 06/22/2015  . History of anemia 06/22/2015  . History of cholecystectomy 06/22/2015  . S/P vasectomy 06/22/2015  . Essential hypertension 06/22/2015    Current medication list and allergy/intolerance information reviewed.   Current Outpatient Medications on File Prior to Visit  Medication Sig Dispense Refill  . azelastine (ASTELIN) 0.1 % nasal spray Place 2 sprays into both nostrils 2 (two) times daily. 30 mL 12  . ketoconazole (NIZORAL) 2 % shampoo Apply 1 application topically 2 (two) times a week. 120 mL 0  . lisdexamfetamine ([name]) 40 MG capsule Take 1 capsule (40 mg total) by mouth every morning. 30 capsule 0  . lisdexamfetamine ([name]) 40 MG capsule Take 1 capsule  (40 mg total) by mouth every morning. 30 capsule 0  . PROAIR HFA 108 (90 Base) MCG/ACT inhaler INHALE 2 PUFFS EVERY 4 TO 6 HOURS AS NEEDED  3  . ranitidine (ZANTAC) 150 MG tablet Take 150 mg by mouth 2 (two) times daily.    . Vitamin D, Ergocalciferol, (DRISDOL) 50000 units CAPS capsule Take 1 capsule (50,000 Units total) by mouth every 7 (seven) days. Recheck Vitamin D in 3 Months 12 capsule 0   No current facility-administered medications on file prior to visit.    Allergies  Allergen Reactions  . Penicillins Shortness Of Breath      Review of Systems:  Constitutional: No recent illness  HEENT: No  headache, no vision change, or cyst and sinus congestion as per history of present illness  Cardiac: No  chest pain, No  pressure, No palpitations  Respiratory:  No  shortness of breath. No  Cough  Gastrointestinal: No  abdominal pain   Exam:  BP 136/79   Pulse (!) 57   Wt 232 lb 0.6 oz (105.3 kg)   BMI 30.61 kg/m   Constitutional: VS see above. General Appearance: alert, well-developed, well-nourished, NAD  Eyes: Normal lids and conjunctive, non-icteric sclera  Ears, Nose, Mouth, Throat: MMM, Normal external inspection ears/nares/mouth/lips/gums.  Neck: No masses, trachea midline.   Respiratory: Normal respiratory effort. no wheeze, no rhonchi, no rales  Cardiovascular: S1/S2 normal, no murmur, no rub/gallop auscultated. RRR.   Musculoskeletal: Gait normal. Symmetric and independent movement of all extremities  Neurological: Normal balance/coordination. No tremor.  Skin: warm, dry, intact.   Psychiatric: Normal judgment/insight. Normal  mood and affect. Oriented x3.    ASSESSMENT/PLAN:   Adult attention deficit disorder - D/C vyvanse, trial Adderall XR, let me know if any problems with cost/coverage, if not working or side effects, come see me and bring pill bottle   Chronic sinusitis, unspecified location - Plan: methylPREDNISolone sodium succinate (SOLU-MEDROL)  125 mg/2 mL injection 125 mg  Low testosterone level in male - Plan: Testosterone  Lipid screening - Plan: Lipid panel  History of chronic fatigue - Plan: CBC, COMPLETE METABOLIC PANEL WITH GFR, TSH, VITAMIN D 25 Hydroxy (Vit-D Deficiency, Fractures), Testosterone     Follow-up plan: Return in about 3 months (around 06/10/2017) for recheck ADD, sooner if needed .  Visit summary with medication list and pertinent instructions was printed for patient to review, alert [place] if any changes needed. All questions at time of visit were answered - patient instructed to contact office with any additional concerns. ER/RTC precautions were reviewed with the patient and understanding verbalized.

## 2017-03-14 ENCOUNTER — Encounter: Payer: Self-pay | Admitting: Osteopathic Medicine

## 2017-06-04 ENCOUNTER — Ambulatory Visit (INDEPENDENT_AMBULATORY_CARE_PROVIDER_SITE_OTHER): Payer: Managed Care, Other (non HMO)

## 2017-06-04 ENCOUNTER — Encounter: Payer: Self-pay | Admitting: Osteopathic Medicine

## 2017-06-04 ENCOUNTER — Ambulatory Visit: Payer: Managed Care, Other (non HMO) | Admitting: Osteopathic Medicine

## 2017-06-04 VITALS — BP 148/85 | HR 58 | Temp 98.0°F | Wt 238.1 lb

## 2017-06-04 DIAGNOSIS — F988 Other specified behavioral and emotional disorders with onset usually occurring in childhood and adolescence: Secondary | ICD-10-CM | POA: Diagnosis not present

## 2017-06-04 DIAGNOSIS — Z23 Encounter for immunization: Secondary | ICD-10-CM | POA: Diagnosis not present

## 2017-06-04 DIAGNOSIS — G8929 Other chronic pain: Secondary | ICD-10-CM

## 2017-06-04 DIAGNOSIS — D229 Melanocytic nevi, unspecified: Secondary | ICD-10-CM | POA: Insufficient documentation

## 2017-06-04 DIAGNOSIS — J4521 Mild intermittent asthma with (acute) exacerbation: Secondary | ICD-10-CM | POA: Insufficient documentation

## 2017-06-04 DIAGNOSIS — J45901 Unspecified asthma with (acute) exacerbation: Secondary | ICD-10-CM | POA: Diagnosis not present

## 2017-06-04 DIAGNOSIS — M25572 Pain in left ankle and joints of left foot: Secondary | ICD-10-CM | POA: Insufficient documentation

## 2017-06-04 IMAGING — DX DG CHEST 2V
2 series · 2 of 2 positions shown · non-contrast
Comparison: None in PACs

CLINICAL DATA: Cough, exacerbation of asthma.  Nonsmoker.

EXAM:
CHEST - 2 VIEW

[chest pa]
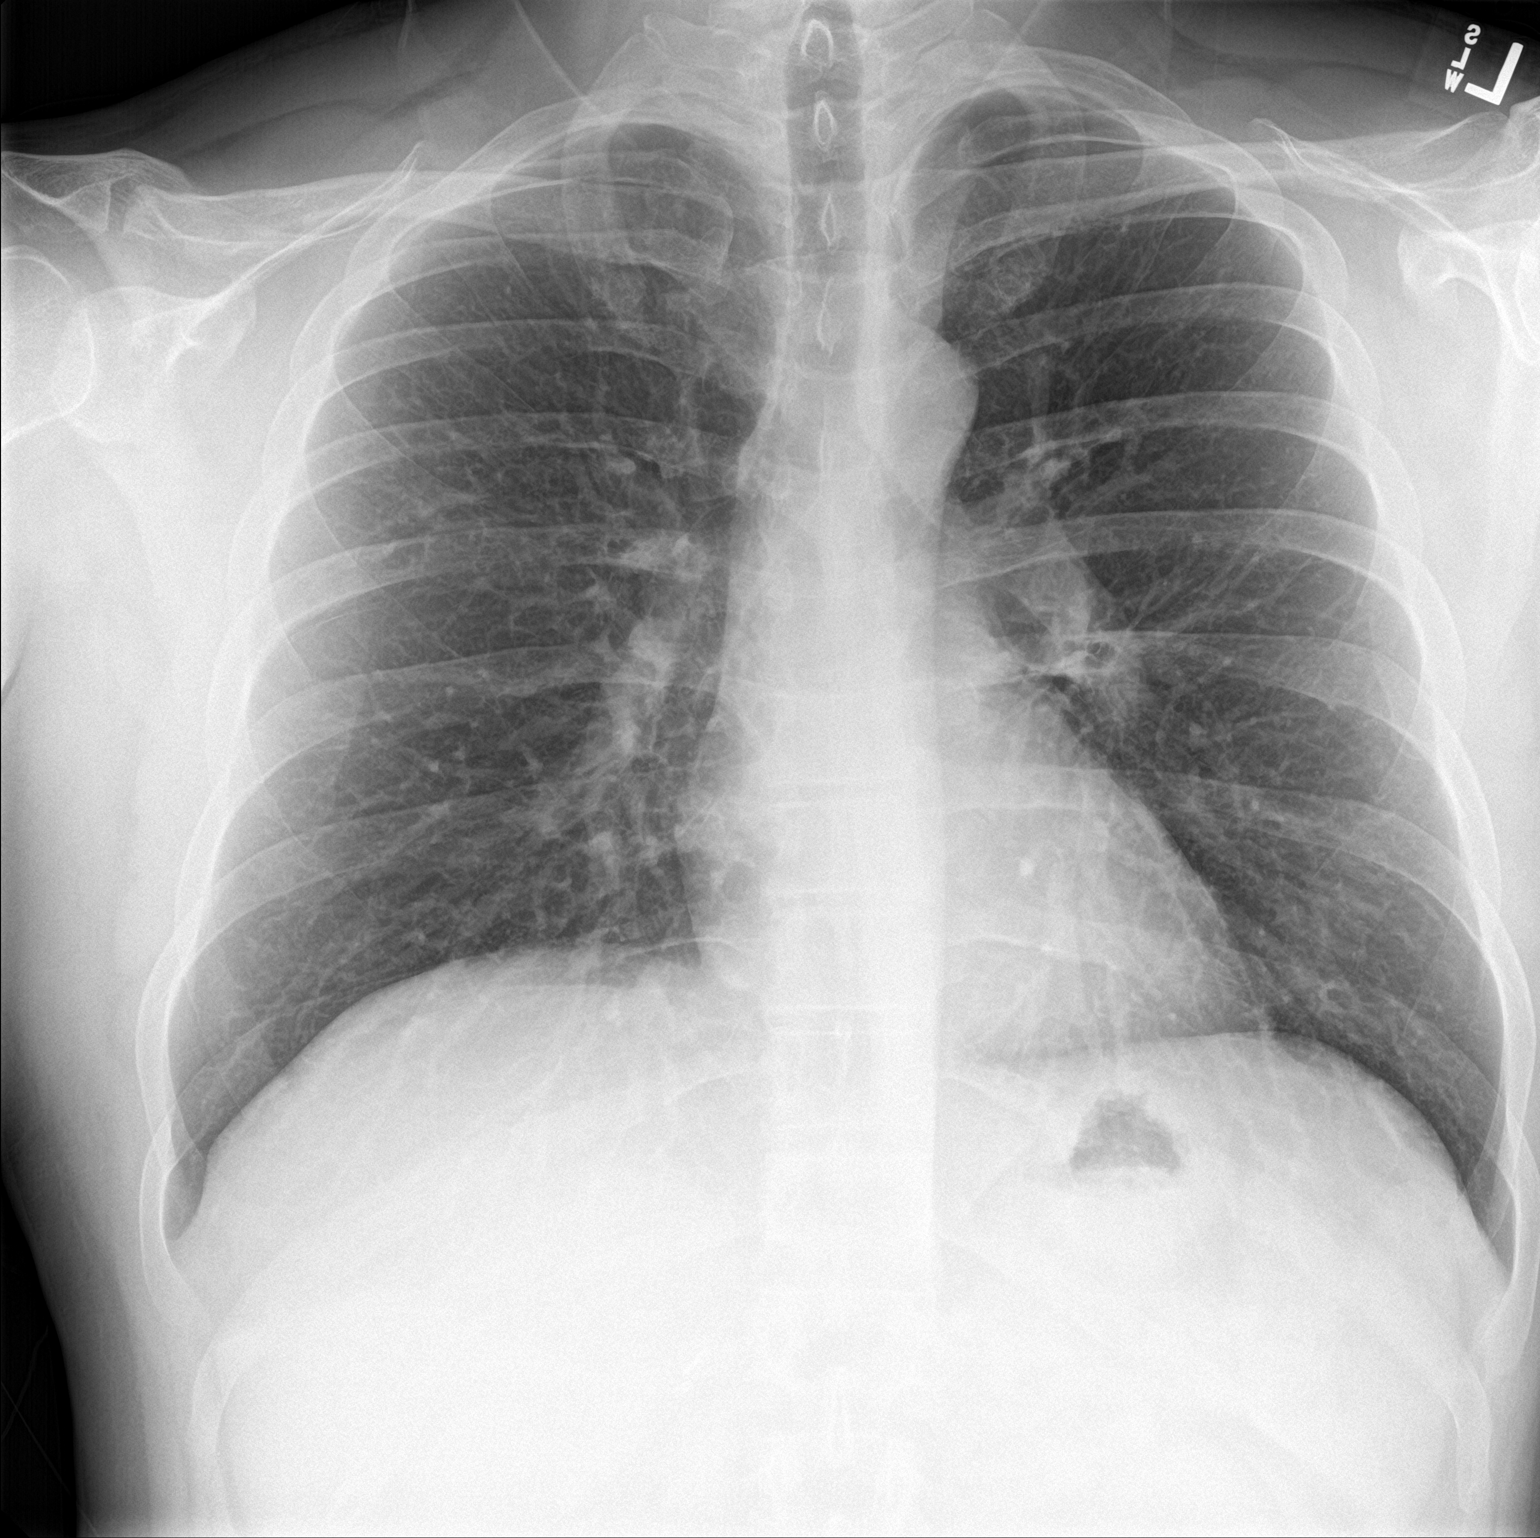

[chest lat]
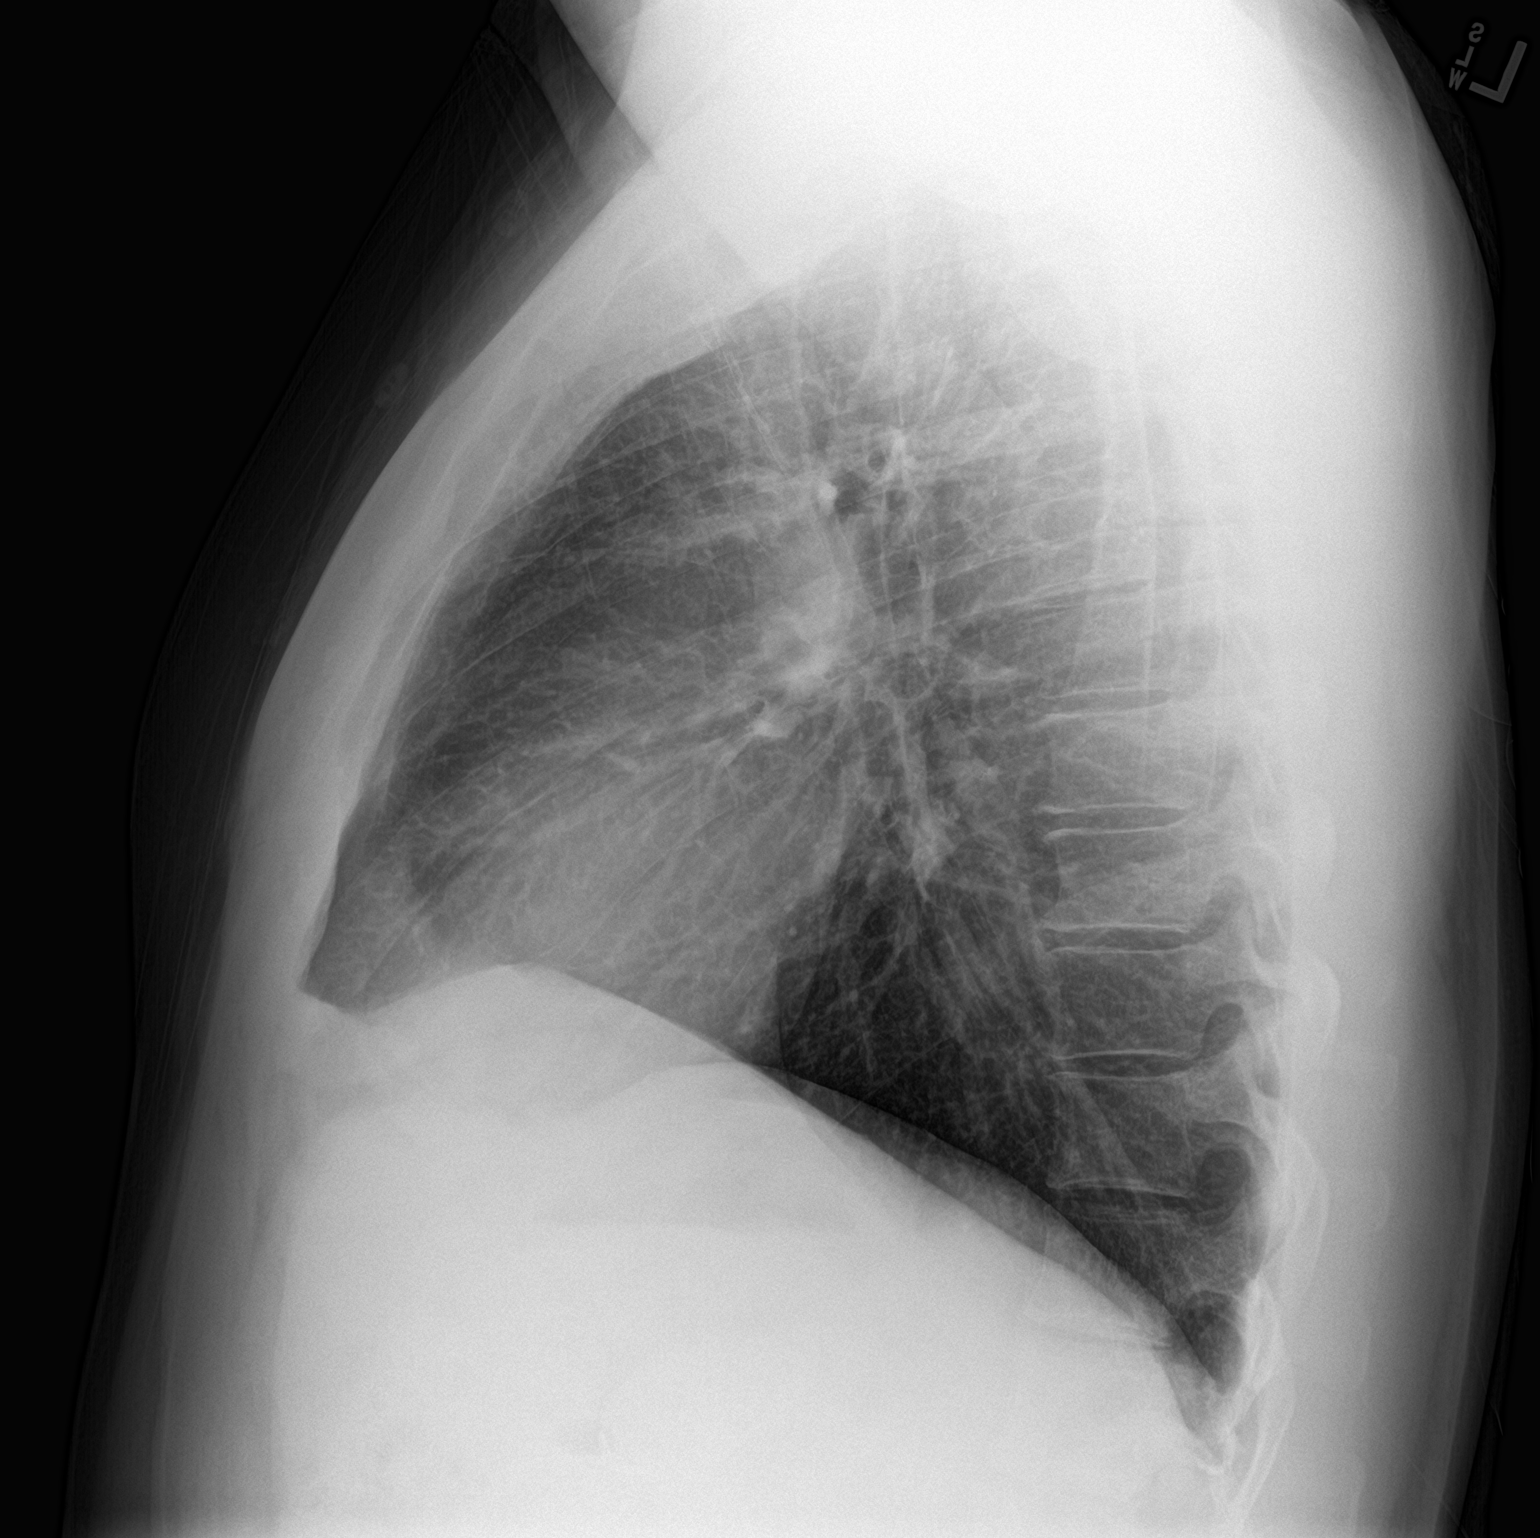

[2 of 2 positions shown; findings below may reference images not displayed]

FINDINGS: The lungs are adequately inflated. There is no focal infiltrate.
There is no pleural effusion. The heart and pulmonary vascularity
are normal. The mediastinum is normal in width. The trachea is
midline. The bony thorax exhibits no acute abnormality.
IMPRESSION: There is no active cardiopulmonary disease.

## 2017-06-04 MED ORDER — PROAIR HFA 108 (90 BASE) MCG/ACT IN AERS: 1.0000 | INHALATION_SPRAY | RESPIRATORY_TRACT | 11 refills | Status: DC | PRN

## 2017-06-04 MED ORDER — AMPHET-DEXTROAMPHET 3-BEAD ER 25 MG PO CP24: 25.0000 mg | ORAL_CAPSULE | ORAL | 0 refills | Status: DC

## 2017-06-04 MED ORDER — PREDNISONE 20 MG PO TABS: 20.0000 mg | ORAL_TABLET | Freq: Two times a day (BID) | ORAL | 0 refills | Status: DC

## 2017-06-04 NOTE — Progress Notes (Signed)
HPI: Alan Soto. Krumholz is a 32 y.o. male  who presents to [place] today, 06/04/17,  for chief complaint of:  Chief Complaint  Patient presents with  . Follow-up - attention deficit disorder.  New problem: Skin issue  Ankle pain on R Wheezing    Attention deficit disorder: No chest pain, palpitation, insomnia, unintentional weight loss. Recent insurance change so [name] is no longer covered, would like to discuss alternatives. Pharmacy let him know that are all would be a covered medication.  Skin issues: would like moles on back checked, wife was worried  Wheezing for a few weeks, hx asthma, feeling a bit under the weather but not too bad. Has rescue inhaler but it's old.   Ankle pain: bothers him on occasion with running. On L achilles and lateral upper ankle area.       Past medical history, surgical history, social history and family history reviewed.  Patient Active Problem List   Diagnosis Date Noted  . Scalp irritation 09/26/2016  . Recurrent sinusitis 09/26/2016  . Deviated septum 01/05/2016  . Adult attention deficit disorder 12/18/2015  . Eosinophilia 07/22/2015  . History of ADHD 07/21/2015  . Mild sleep apnea 07/21/2015  . Labral tear of shoulder 07/21/2015  . Testosterone deficiency 06/22/2015  . Chronic fatigue 06/22/2015  . Seasonal allergies 06/22/2015  . Esophageal reflux 06/22/2015  . History of anemia 06/22/2015  . History of cholecystectomy 06/22/2015  . S/P vasectomy 06/22/2015  . Essential hypertension 06/22/2015    Current medication list and allergy/intolerance information reviewed.   Current Outpatient Medications on File Prior to Visit  Medication Sig Dispense Refill  . amphetamine-dextroamphetamine (ADDERALL XR) 15 MG 24 hr capsule Take 1 capsule by mouth every morning. 90 capsule 0  . azelastine (ASTELIN) 0.1 % nasal spray Place 2 sprays into both nostrils 2 (two) times daily. 30 mL 12  . ketoconazole  (NIZORAL) 2 % shampoo Apply 1 application topically 2 (two) times a week. 120 mL 0  . PROAIR HFA 108 (90 Base) MCG/ACT inhaler INHALE 2 PUFFS EVERY 4 TO 6 HOURS AS NEEDED  3  . ranitidine (ZANTAC) 150 MG tablet Take 150 mg by mouth 2 (two) times daily.    . Vitamin D, Ergocalciferol, (DRISDOL) 50000 units CAPS capsule Take 1 capsule (50,000 Units total) by mouth every 7 (seven) days. Recheck Vitamin D in 3 Months 12 capsule 0   No current facility-administered medications on file prior to visit.    Allergies  Allergen Reactions  . Penicillins Shortness Of Breath      Review of Systems:  Constitutional: No recent illness  HEENT: No  headache, no vision change  Cardiac: No  chest pain, No  pressure, No palpitations  Respiratory:  +shortness of breath. +Cough  Gastrointestinal: No  abdominal pain  Skin: moles as per HPI   Exam:  BP (!) 148/85 (BP Location: Left Arm, Patient Position: Sitting, Cuff Size: Large)   Pulse (!) 58   Temp 98 F (36.7 C) (Oral)   Wt 238 lb 1.3 oz (108 kg)   BMI 31.41 kg/m   Constitutional: VS see above. General Appearance: alert, well-developed, well-nourished, NAD  Eyes: Normal lids and conjunctive, non-icteric sclera  Ears, Nose, Mouth, Throat: MMM, Normal external inspection ears/nares/mouth/lips/gums.  Neck: No masses, trachea midline.   Respiratory: Normal respiratory effort. +wheezeL middle, no rhonchi, no rales  Cardiovascular: S1/S2 normal, no murmur, no rub/gallop auscultated. RRR.   Musculoskeletal: Gait normal. Symmetric and independent  movement of all extremities. No ecchymoses.swelling anke, normal ankle ROM on L   Neurological: Normal balance/coordination. No tremor.  Skin: warm, dry, intact. Benign appearing nevi on back  Psychiatric: Normal judgment/insight. Normal mood and affect. Oriented x3.    ASSESSMENT/PLAN:   Adult attention deficit disorder - try higher dose meds  Need for Tdap vaccination - Plan: Tdap  vaccine greater than or equal to 7yo IM  Benign mole - appear benign, offered RTC for biopsy, took pictures to compare to at next visit, the one in the middle is slightly more irregular   Mild intermittent asthma with exacerbation - abn lung sounds, will get CXR, treat as asthma exacerbation, refill inhalers. No fever, no cough - Plan: DG Chest 2 View  Chronic pain of left ankle - printed PT instructions, f/u sports med if no better     Meds ordered this encounter  Medications  . Amphet-Dextroamphet 3-Bead ER 25 MG CP24    Sig: Take 25 mg by mouth every morning.    Dispense:  30 capsule    Refill:  0  . PROAIR HFA 108 (90 Base) MCG/ACT inhaler    Sig: Inhale 1-2 puffs into the lungs every 4 (four) hours as needed for wheezing or shortness of breath.    Dispense:  2 Inhaler    Refill:  11  . predniSONE (DELTASONE) 20 MG tablet    Sig: Take 1 tablet (20 mg total) by mouth 2 (two) times daily with a meal.    Dispense:  10 tablet    Refill:  0     Follow-up plan: Return in about 1 month (around 07/02/2017) for recheck on ADHD meds, sooner if needed .  Visit summary with medication list and pertinent instructions was printed for patient to review, alert [place] if any changes needed. All questions at time of visit were answered - patient instructed to contact office with any additional concerns. ER/RTC precautions were reviewed with the patient and understanding verbalized.

## 2017-06-05 ENCOUNTER — Ambulatory Visit: Payer: Managed Care, Other (non HMO) | Admitting: Osteopathic Medicine

## 2017-06-06 ENCOUNTER — Ambulatory Visit: Payer: Managed Care, Other (non HMO) | Admitting: Osteopathic Medicine

## 2017-06-27 ENCOUNTER — Ambulatory Visit: Payer: Managed Care, Other (non HMO) | Admitting: Osteopathic Medicine

## 2017-06-28 ENCOUNTER — Telehealth: Payer: Self-pay | Admitting: Osteopathic Medicine

## 2017-06-28 MED ORDER — AMPHET-DEXTROAMPHET 3-BEAD ER 25 MG PO CP24: 25.0000 mg | ORAL_CAPSULE | ORAL | 0 refills | Status: DC

## 2017-06-28 NOTE — Telephone Encounter (Signed)
Pt called and stated he has 4 pills left of his adderall and wants to know if he can go ahead and get a refill sent to [facility] in [place]. He has made several appointments that have had to be cancelled due to Sheppard Coil having to be out. I currently have him scheduled for May 16th with dr. Sheppard Coil. Thanks

## 2017-06-28 NOTE — Telephone Encounter (Signed)
Called pt to let him know his medication was sent. Thanks

## 2017-06-28 NOTE — Telephone Encounter (Signed)
Refill sent.

## 2017-07-02 ENCOUNTER — Ambulatory Visit: Payer: Managed Care, Other (non HMO) | Admitting: Physician Assistant

## 2017-07-02 ENCOUNTER — Encounter: Payer: Self-pay | Admitting: Physician Assistant

## 2017-07-02 VITALS — BP 147/72 | HR 74 | Temp 98.7°F | Ht 73.0 in | Wt 240.0 lb

## 2017-07-02 DIAGNOSIS — J342 Deviated nasal septum: Secondary | ICD-10-CM

## 2017-07-02 DIAGNOSIS — H6122 Impacted cerumen, left ear: Secondary | ICD-10-CM | POA: Diagnosis not present

## 2017-07-02 DIAGNOSIS — J329 Chronic sinusitis, unspecified: Secondary | ICD-10-CM

## 2017-07-02 MED ORDER — METHYLPREDNISOLONE 4 MG PO TBPK: ORAL_TABLET | ORAL | 0 refills | Status: DC

## 2017-07-02 MED ORDER — AZITHROMYCIN 250 MG PO TABS: ORAL_TABLET | ORAL | 0 refills | Status: DC

## 2017-07-02 NOTE — Patient Instructions (Signed)

## 2017-07-02 NOTE — Progress Notes (Signed)
Subjective:    Patient ID: Alan Soto. Boeh, male    DOB: 1985/10/05, 32 y.o.   MRN: [id number]  HPI 32 year old male with a PMH of sinus problems/sinusitis and asthma comes in for evaluation of nasal congestion, cough, and sneezing. He has had his sinus issues evaluated previously by ENT and was told that he needs surgery, however he has not gotten around to doing so.  This current episode has been going on for the past 7 days, since last Friday (4/3) evening. His cough is dry in nature. He has nasal congestion, sneezing, and left ear pain. He describes the ear pain as a feeling of pressure and "like he's underwater". He has tried taking Dayquil, Sudafed, and Afrin without much relief. He has used Flonase in the past without much relief. He denies fever, sick contacts.  .. Active Ambulatory Problems    Diagnosis Date Noted  . Testosterone deficiency 06/22/2015  . Chronic fatigue 06/22/2015  . Seasonal allergies 06/22/2015  . Esophageal reflux 06/22/2015  . History of anemia 06/22/2015  . History of cholecystectomy 06/22/2015  . S/P vasectomy 06/22/2015  . Essential hypertension 06/22/2015  . History of ADHD 07/21/2015  . Mild sleep apnea 07/21/2015  . Labral tear of shoulder 07/21/2015  . Eosinophilia 07/22/2015  . Adult attention deficit disorder 12/18/2015  . Deviated septum 01/05/2016  . Scalp irritation 09/26/2016  . Recurrent sinusitis 09/26/2016  . Benign mole 06/04/2017  . Chronic pain of left ankle 06/04/2017  . Mild intermittent asthma with exacerbation 06/04/2017   Resolved Ambulatory Problems    Diagnosis Date Noted  . Bacterial sinusitis 07/22/2015   Past Medical History:  Diagnosis Date  . Asthma 02/27/1992  . Deviated septum 01/05/2016  . Esophageal reflux 06/22/2015  . Essential hypertension 06/22/2015  . History of ADHD 07/21/2015  . History of cholecystectomy 06/22/2015  . Hypertension   . Labral tear of shoulder 07/21/2015  . Mild sleep apnea 07/21/2015  .  S/P vasectomy 06/22/2015  . Seasonal allergies 06/22/2015  . Testosterone deficiency 06/22/2015      Review of Systems  All other systems reviewed and are negative.      Objective:   Physical Exam  Constitutional: He is oriented to person, place, and time. He appears well-developed and well-nourished.  HENT:  Head: Normocephalic and atraumatic.  Mouth/Throat: Oropharynx is clear and moist.  TM's clear bilaterally.  Turbinates red and swollen with deviated septum. Tenderness over maxillary, nasal, frontal sinuses.   Eyes: Pupils are equal, round, and reactive to light. EOM are normal.  Neck: Normal range of motion. Neck supple.  Cardiovascular: Normal rate, regular rhythm and normal heart sounds.  No murmur heard. Pulmonary/Chest: Effort normal and breath sounds normal. He has no wheezes.  Neurological: He is alert and oriented to person, place, and time.  Skin: Skin is warm. No rash noted.  Psychiatric: He has a normal mood and affect. His behavior is normal.          Assessment & Plan:  Marland KitchenMarland KitchenChase was seen today for cough, headache, sore throat and otalgia.  Diagnoses and all orders for this visit:  Recurrent sinusitis -     azithromycin (ZITHROMAX) 250 MG tablet; Take 2 tablets now and then one tablet for 4 days. -     methylPREDNISolone (MEDROL DOSEPAK) 4 MG TBPK tablet; Take as directed by package insert  Deviated septum  Left ear impacted cerumen   .Marland KitchenIndication: Cerumen impaction of the ear(s)  Medical necessity statement: On physical examination,  cerumen impairs clinically significant portions of the external auditory canal, and tympanic membrane. Noted obstructive, copious cerumen that cannot be removed without magnification and instrumentations requiring physician skills Consent: Discussed benefits and risks of procedure and verbal consent obtained Procedure: Patient was prepped for the procedure. Utilized an otoscope to assess and take note of the ear canal,  the tympanic membrane, and the presence, amount, and placement of the cerumen. Gentle water irrigation and soft plastic curette was utilized to remove cerumen.  Post procedure examination: shows cerumen was completely removed. Patient tolerated procedure well. The patient is made aware that they may experience temporary vertigo, temporary hearing loss, and temporary discomfort. If these symptom last for more than 24 hours to call the clinic or proceed to the ED.  zpak and medrol dose pack given. HO given. Follow up as needed. Continue with allergy medications.

## 2017-07-04 ENCOUNTER — Ambulatory Visit: Payer: Managed Care, Other (non HMO) | Admitting: Osteopathic Medicine

## 2017-07-11 ENCOUNTER — Ambulatory Visit: Payer: Managed Care, Other (non HMO) | Admitting: Osteopathic Medicine

## 2017-07-11 ENCOUNTER — Encounter: Payer: Self-pay | Admitting: Osteopathic Medicine

## 2017-07-11 VITALS — BP 138/75 | HR 60 | Temp 98.0°F | Wt 241.5 lb

## 2017-07-11 DIAGNOSIS — F988 Other specified behavioral and emotional disorders with onset usually occurring in childhood and adolescence: Secondary | ICD-10-CM

## 2017-07-11 DIAGNOSIS — J329 Chronic sinusitis, unspecified: Secondary | ICD-10-CM

## 2017-07-11 MED ORDER — DOXYCYCLINE MONOHYDRATE 100 MG PO CAPS: 100.0000 mg | ORAL_CAPSULE | Freq: Two times a day (BID) | ORAL | 0 refills | Status: DC

## 2017-07-11 MED ORDER — AMPHET-DEXTROAMPHET 3-BEAD ER 25 MG PO CP24: 25.0000 mg | ORAL_CAPSULE | ORAL | 0 refills | Status: DC

## 2017-07-11 MED ORDER — AZELASTINE HCL 0.1 % NA SOLN: 2.0000 | Freq: Two times a day (BID) | NASAL | 12 refills | Status: DC

## 2017-07-11 NOTE — Progress Notes (Signed)
HPI: Alan Soto. Alan Soto is a 32 y.o. male  who presents to [facility] [place] today, 07/11/17,  for chief complaint of:  Chief Complaint  Patient presents with   ADD recheck     Attention deficit disorder: No chest pain, palpitation, insomnia, unintentional weight loss. Recent insurance change so [name] is no longer covered, we started Adderall ER 25 mg daily, he'd like to continue this.   Sinus problems: chronic issue, L ear bothering him today, recently treated for sinusitis by a colleague here. Feeling about 50% better.     Past medical history, surgical history, social history and family history reviewed.  Patient Active Problem List   Diagnosis Date Noted  . Benign mole 06/04/2017  . Chronic pain of left ankle 06/04/2017  . Mild intermittent asthma with exacerbation 06/04/2017  . Scalp irritation 09/26/2016  . Recurrent sinusitis 09/26/2016  . Deviated septum 01/05/2016  . Adult attention deficit disorder 12/18/2015  . Eosinophilia 07/22/2015  . History of ADHD 07/21/2015  . Mild sleep apnea 07/21/2015  . Labral tear of shoulder 07/21/2015  . Testosterone deficiency 06/22/2015  . Chronic fatigue 06/22/2015  . Seasonal allergies 06/22/2015  . Esophageal reflux 06/22/2015  . History of anemia 06/22/2015  . History of cholecystectomy 06/22/2015  . S/P vasectomy 06/22/2015  . Essential hypertension 06/22/2015    Current medication list and allergy/intolerance information reviewed.   Current Outpatient Medications on File Prior to Visit  Medication Sig Dispense Refill  . Amphet-Dextroamphet 3-Bead ER 25 MG CP24 Take 25 mg by mouth every morning. 30 capsule 0  . azelastine (ASTELIN) 0.1 % nasal spray Place 2 sprays into both nostrils 2 (two) times daily. 30 mL 12  . ketoconazole (NIZORAL) 2 % shampoo Apply 1 application topically 2 (two) times a week. 120 mL 0  . methylPREDNISolone (MEDROL DOSEPAK) 4 MG TBPK tablet Take as directed by package  insert 20 tablet 0  . PROAIR HFA 108 (90 Base) MCG/ACT inhaler Inhale 1-2 puffs into the lungs every 4 (four) hours as needed for wheezing or shortness of breath. 2 Inhaler 11  . ranitidine (ZANTAC) 150 MG tablet Take 150 mg by mouth 2 (two) times daily.    Marland Kitchen azithromycin (ZITHROMAX) 250 MG tablet Take 2 tablets now and then one tablet for 4 days. (Patient not taking: Reported on 07/11/2017) 6 tablet 0   No current facility-administered medications on file prior to visit.    Allergies  Allergen Reactions  . Penicillins Shortness Of Breath      Review of Systems:  Constitutional: No recent illness  HEENT: No  headache, no vision change  Cardiac: No  chest pain, No  pressure, No palpitations  Respiratory:  +chronic sinus issues   Gastrointestinal: No  abdominal pain  Skin: moles as per HPI   Exam:  BP 138/75 (BP Location: Left Arm, Patient Position: Sitting, Cuff Size: Normal)   Pulse 60   Temp 98 F (36.7 C) (Oral)   Wt 241 lb 8 oz (109.5 kg)   BMI 31.86 kg/m   Constitutional: VS see above. General Appearance: alert, well-developed, well-nourished, NAD  Eyes: Normal lids and conjunctive, non-icteric sclera  Ears, Nose, Mouth, Throat: MMM, Normal external inspection ears/nares/mouth/lips/gums. TM normal on R w scant clear effusion, TM on L mild erythema but no dullness or bulging.   Neck: No masses, trachea midline.   Respiratory: Normal respiratory effort.   Cardiovascular: S1/S2 normal, no murmur, no rub/gallop auscultated. RRR.   Musculoskeletal: Gait normal.  Symmetric and independent movement of all extremities  Neurological: Normal balance/coordination. No tremor.  Skin: warm, dry, intact. Benign appearing nevi on back  Psychiatric: Normal judgment/insight. Normal mood and affect. Oriented x3.    ASSESSMENT/PLAN:   Adult attention deficit disorder - doing well on current dose, refilled for 3 months   Recurrent sinusitis - fill abx if worse, L ear looks  more irritated but no active OM     Meds ordered this encounter  Medications  . DISCONTD: Amphet-Dextroamphet 3-Bead ER 25 MG CP24    Sig: Take 25 mg by mouth every morning.    Dispense:  30 capsule    Refill:  0  . DISCONTD: Amphet-Dextroamphet 3-Bead ER 25 MG CP24    Sig: Take 25 mg by mouth every morning.    Dispense:  30 capsule    Refill:  0  . Amphet-Dextroamphet 3-Bead ER 25 MG CP24    Sig: Take 25 mg by mouth every morning.    Dispense:  30 capsule    Refill:  0  . doxycycline (MONODOX) 100 MG capsule    Sig: Take 1 capsule (100 mg total) by mouth 2 (two) times daily.    Dispense:  14 capsule    Refill:  0  . azelastine (ASTELIN) 0.1 % nasal spray    Sig: Place 2 sprays into both nostrils 2 (two) times daily.    Dispense:  30 mL    Refill:  12     Follow-up plan: Return in about 3 months (around 10/11/2017) for refill ADHD medications, sooner if needed .  Visit summary with medication list and pertinent instructions was printed for patient to review, alert [place] if any changes needed. All questions at time of visit were answered - patient instructed to contact office with any additional concerns. ER/RTC precautions were reviewed with the patient and understanding verbalized.

## 2017-07-12 LAB — COMPLETE METABOLIC PANEL WITH GFR
AG Ratio: 1.6 (calc) (ref 1.0–2.5)
ALT: 39 U/L (ref 9–46)
AST: 24 U/L (ref 10–40)
Albumin: 4.3 g/dL (ref 3.6–5.1)
Alkaline phosphatase (APISO): 62 U/L (ref 40–115)
BUN: 10 mg/dL (ref 7–25)
CALCIUM: 9.6 mg/dL (ref 8.6–10.3)
CO2: 28 mmol/L (ref 20–32)
CREATININE: 0.82 mg/dL (ref 0.60–1.35)
Chloride: 105 mmol/L (ref 98–110)
GFR, EST AFRICAN AMERICAN: 136 mL/min/{1.73_m2} (ref >=60)
GFR, EST NON AFRICAN AMERICAN: 117 mL/min/{1.73_m2} (ref >=60)
GLUCOSE: 97 mg/dL (ref 65–99)
Globulin: 2.7 g/dL (calc) (ref 1.9–3.7)
Potassium: 4.5 mmol/L (ref 3.5–5.3)
Sodium: 139 mmol/L (ref 135–146)
TOTAL PROTEIN: 7 g/dL (ref 6.1–8.1)
Total Bilirubin: 0.4 mg/dL (ref 0.2–1.2)

## 2017-07-12 LAB — CBC
HCT: 39.8 % (ref 38.5–50.0)
HEMOGLOBIN: 13.2 g/dL (ref 13.2–17.1)
MCH: 25.8 pg — AB (ref 27.0–33.0)
MCHC: 33.2 g/dL (ref 32.0–36.0)
MCV: 77.9 fL — AB (ref 80.0–100.0)
MPV: 9.6 fL (ref 7.5–12.5)
PLATELETS: 374 10*3/uL (ref 140–400)
RBC: 5.11 10*6/uL (ref 4.20–5.80)
RDW: 13.2 % (ref 11.0–15.0)
WBC: 6.1 10*3/uL (ref 3.8–10.8)

## 2017-07-12 LAB — LIPID PANEL
Cholesterol: 239 mg/dL — ABNORMAL HIGH (ref <200)
HDL: 47 mg/dL (ref >40)
LDL CHOLESTEROL (CALC): 169 mg/dL — AB
NON-HDL CHOLESTEROL (CALC): 192 mg/dL — AB (ref <130)
Total CHOL/HDL Ratio: 5.1 (calc) — ABNORMAL HIGH (ref <5.0)
Triglycerides: 105 mg/dL (ref <150)

## 2017-07-12 LAB — TSH: TSH: 2.59 m[IU]/L (ref 0.40–4.50)

## 2017-07-12 LAB — TESTOSTERONE: Testosterone: 589 ng/dL (ref 250–827)

## 2017-07-12 LAB — VITAMIN D 25 HYDROXY (VIT D DEFICIENCY, FRACTURES): Vit D, 25-Hydroxy: 26 ng/mL — ABNORMAL LOW (ref 30–100)

## 2017-10-08 ENCOUNTER — Telehealth: Payer: Self-pay

## 2017-10-08 ENCOUNTER — Ambulatory Visit: Payer: Managed Care, Other (non HMO) | Admitting: Osteopathic Medicine

## 2017-10-08 ENCOUNTER — Ambulatory Visit (INDEPENDENT_AMBULATORY_CARE_PROVIDER_SITE_OTHER): Payer: Managed Care, Other (non HMO)

## 2017-10-08 ENCOUNTER — Encounter: Payer: Self-pay | Admitting: Osteopathic Medicine

## 2017-10-08 VITALS — BP 140/74 | HR 61 | Temp 97.8°F | Wt 242.9 lb

## 2017-10-08 DIAGNOSIS — R0602 Shortness of breath: Secondary | ICD-10-CM

## 2017-10-08 DIAGNOSIS — F988 Other specified behavioral and emotional disorders with onset usually occurring in childhood and adolescence: Secondary | ICD-10-CM

## 2017-10-08 DIAGNOSIS — R0781 Pleurodynia: Secondary | ICD-10-CM

## 2017-10-08 DIAGNOSIS — R079 Chest pain, unspecified: Secondary | ICD-10-CM

## 2017-10-08 IMAGING — DX DG CHEST 2V
2 series · 2 of 2 positions shown · non-contrast
Comparison: [DATE]

CLINICAL DATA: Shortness of breath, pleuritic chest pain

EXAM:
CHEST - 2 VIEW

[chest pa]
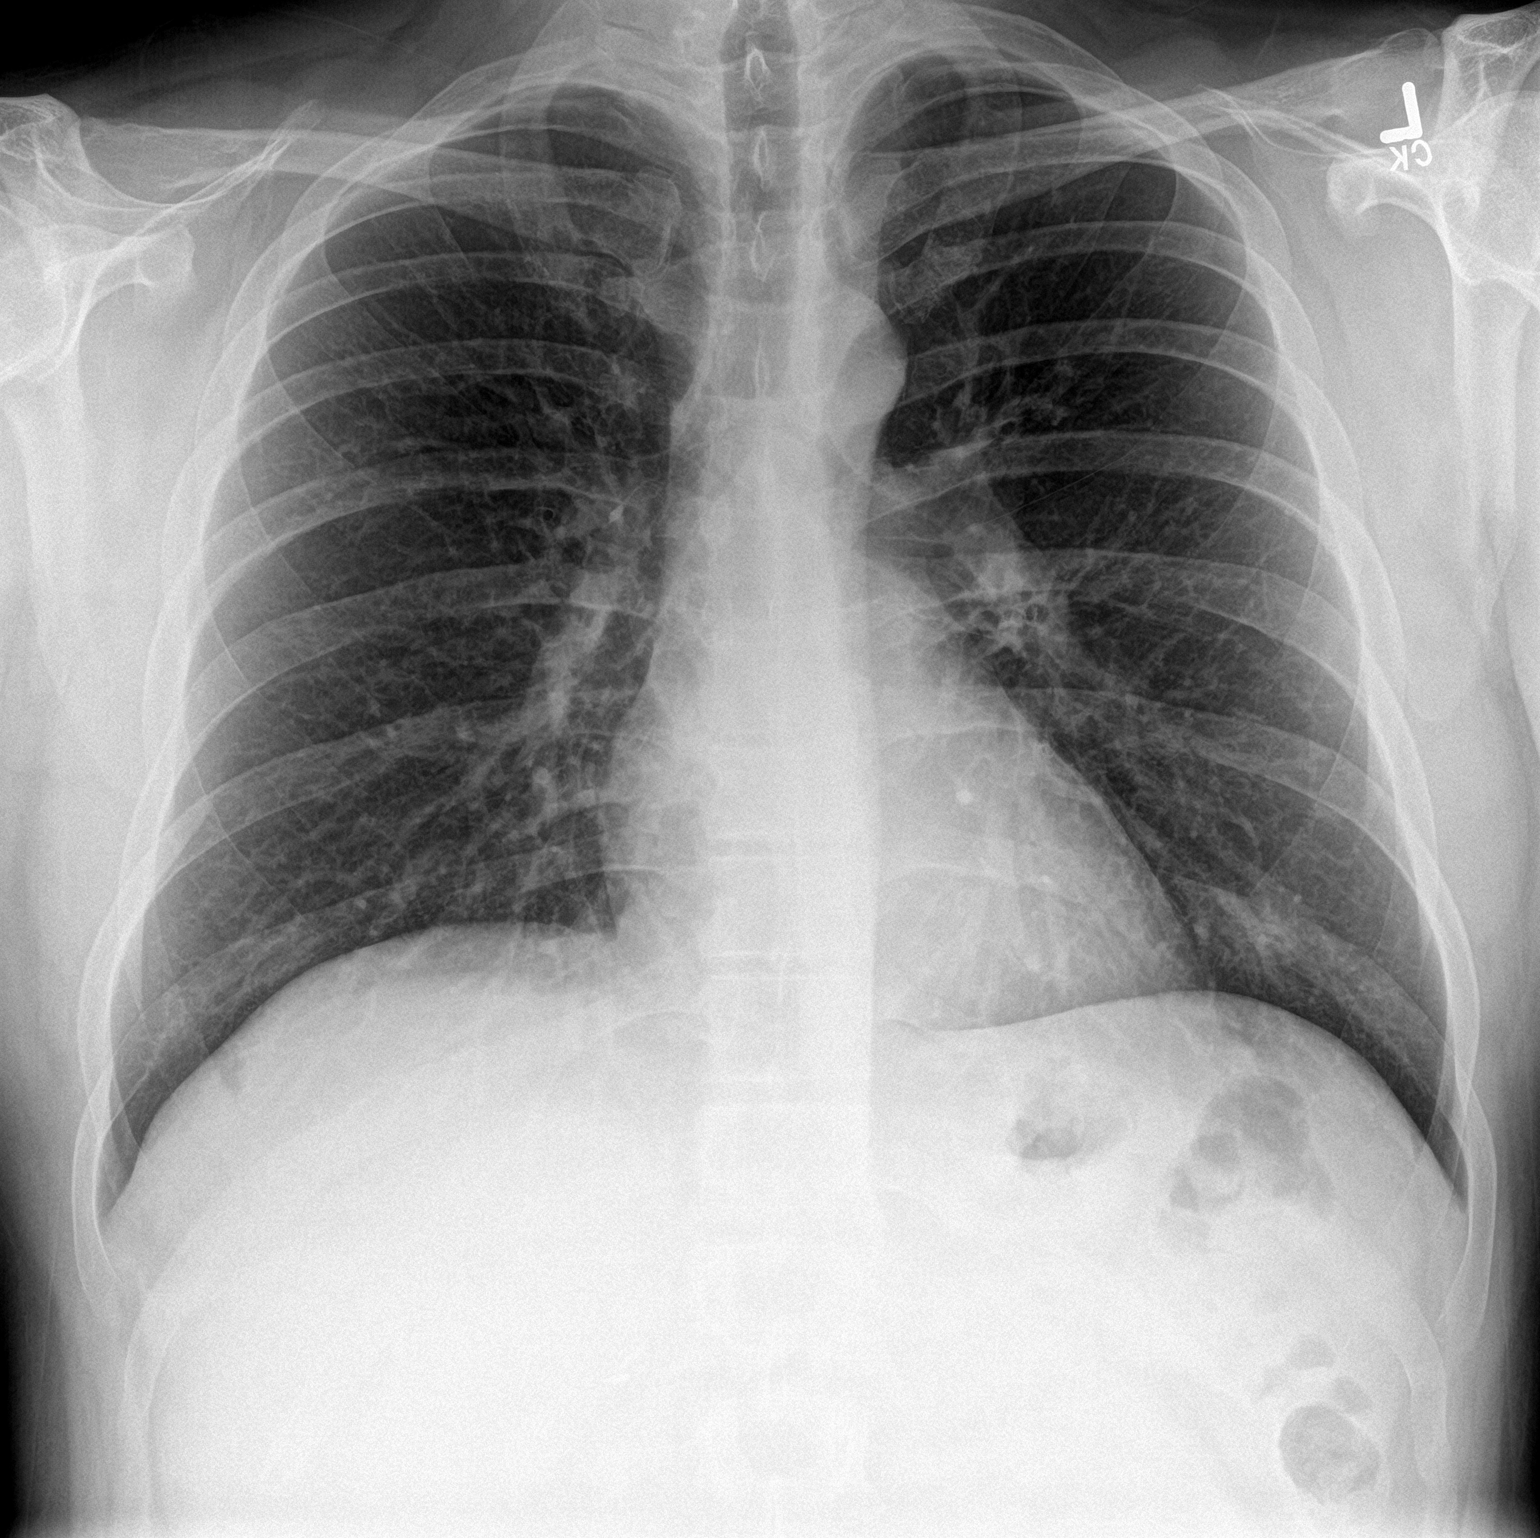

[chest lat]
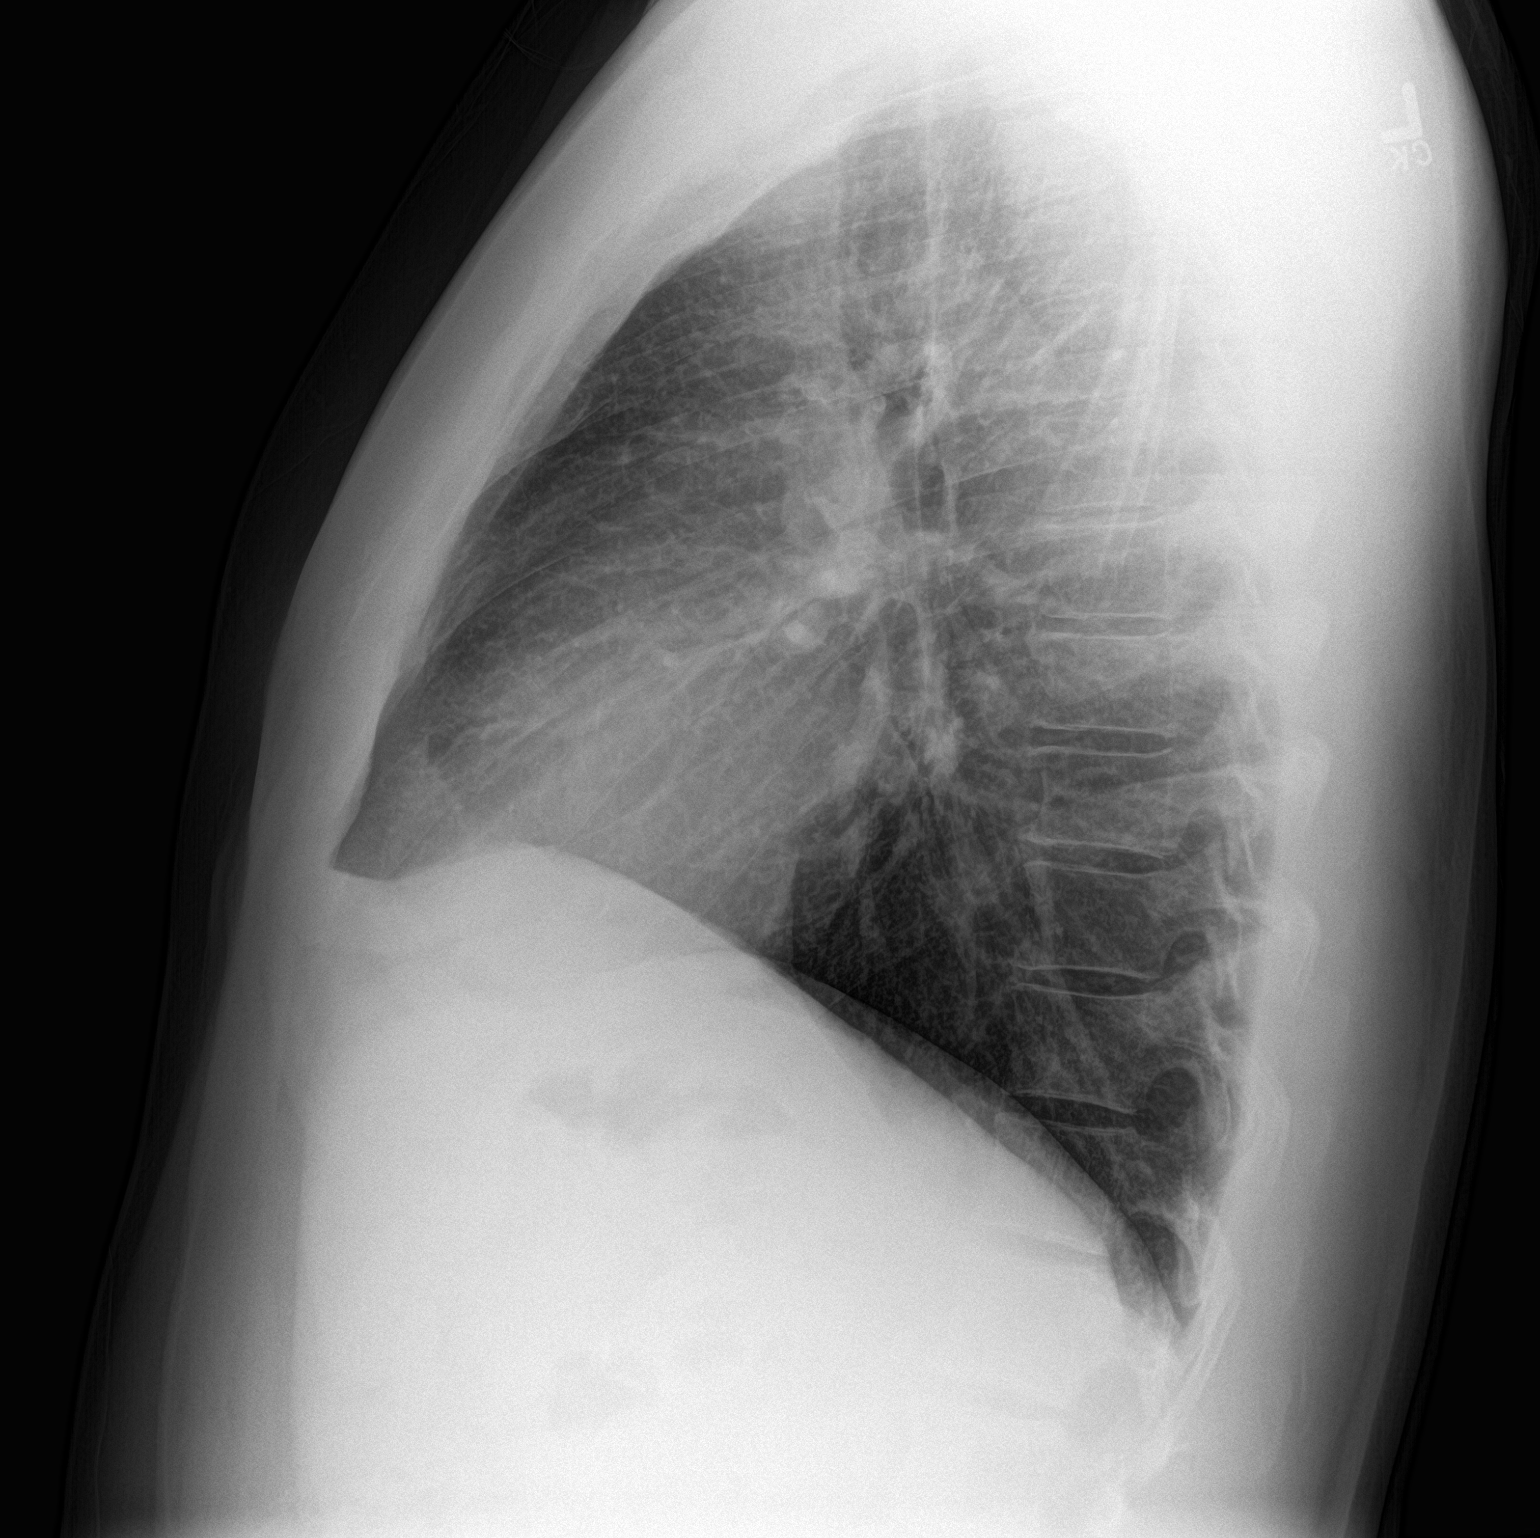

[2 of 2 positions shown; findings below may reference images not displayed]

FINDINGS: Heart and mediastinal contours are within normal limits. No focal
opacities or effusions. No acute bony abnormality.
IMPRESSION: No active cardiopulmonary disease.

## 2017-10-08 MED ORDER — LISDEXAMFETAMINE DIMESYLATE 40 MG PO CAPS: 40.0000 mg | ORAL_CAPSULE | ORAL | 0 refills | Status: DC

## 2017-10-08 MED ORDER — POCKET SPACER DEVI: 2 refills | Status: DC

## 2017-10-08 MED ORDER — FLUTICASONE-SALMETEROL 115-21 MCG/ACT IN AERO: 2.0000 | INHALATION_SPRAY | Freq: Two times a day (BID) | RESPIRATORY_TRACT | 11 refills | Status: DC

## 2017-10-08 NOTE — Patient Instructions (Addendum)
Plan:  For young, otherwise healthy people with shortness of breath on exertion, we need to rule out cardiac disease. In your case, your symptoms are more concerning for pleuritic (chest wall/lung) problem or unusual presentation of exercise-induced asthma, but we need to be sure!   EKG today is okay, which is reassuring.   Will get you setup with a stress test, which gives an even better idea of cardiac function than the EKG.   Will also get you set up with labs to rule out other causes of shortness of breath such as anemia.   Will get chest Xray today.   We should consider a lung function test in the office to evaluate for severity of asthma, or we can start using spacer w/ albuterol inhaler prior to exercise and see if this resolves the issue. Will also add daily asthma control medication.  We may also consider restarting blood pressure medicines at a low dose.   If we cannot find a cause for your symptoms, we should consider referral to a specialist.   If your chest pain worsens, or other concerns, please seek care by calling 911 or going to closest emergency room!   Additionally: will see if we can get [name] covered.

## 2017-10-08 NOTE — Telephone Encounter (Signed)
As per Pharmacist Lonn Georgia, faxed order for vyvanse cannot be accepted. Pls resend rx electronically to [name] or pt must present hard copy to pharmacy. Thanks.

## 2017-10-08 NOTE — Progress Notes (Signed)
HPI: Alan Soto. Lepp is a 32 y.o. male who  has a past medical history of Asthma (02/27/1992), Deviated septum (01/05/2016), Esophageal reflux (06/22/2015), Essential hypertension (06/22/2015), History of ADHD (07/21/2015), History of cholecystectomy (06/22/2015), Hypertension, Labral tear of shoulder (07/21/2015), Mild sleep apnea (07/21/2015), S/P vasectomy (06/22/2015), Seasonal allergies (06/22/2015), and Testosterone deficiency (06/22/2015).  he presents to [facility] today, 10/08/17,  for chief complaint of:  Short of breath  For about a month, chest pain on L and SOB every time he exercises. Pain w/ trying to take a deep breath. Feels like his inhalation is limited due to this. Not improved with albuterol prior to exercise. Maybe a bit worse over the past month. Was out of town this weekend and was wearing a HR monitor, HR never got over 157 and he was really exerting himself, couldn't get it any higher.    Requests going back to [name] if possible.    Past medical history, surgical history, and family history reviewed.  Current medication list and allergy/intolerance information reviewed.   (See remainder of HPI, ROS, Phys Exam below)  Studies today:  Dg Chest 2 View  Result Date: 10/08/2017 CLINICAL DATA:  Shortness of breath, pleuritic chest pain EXAM: CHEST - 2 VIEW COMPARISON:  06/04/2017 FINDINGS: Heart and mediastinal contours are within normal limits. No focal opacities or effusions. No acute bony abnormality. IMPRESSION: No active cardiopulmonary disease. Electronically Signed   By: Rolm Baptise M.D.   On: 10/08/2017 08:15   EKG interpretation: Rate: 66 Rhythm: sinus No ST/T changes concerning for acute ischemia/infarct  Previous EKG no tracings available      ASSESSMENT/PLAN:   Short of breath on exertion - Plan: DG Chest 2 View, Exercise Tolerance Test, CBC, COMPLETE METABOLIC PANEL WITH GFR, TSH, EKG 12-Lead  Pleuritic chest pain -  Plan: DG Chest 2 View, Exercise Tolerance Test, CBC, COMPLETE METABOLIC PANEL WITH GFR, TSH  Chest pain, unspecified type - Plan: DG Chest 2 View, Exercise Tolerance Test, CBC, COMPLETE METABOLIC PANEL WITH GFR, TSH, EKG 12-Lead  Adult attention deficit disorder - Plan: lisdexamfetamine ([name]) 40 MG capsule, CBC, COMPLETE METABOLIC PANEL WITH GFR, TSH   Meds ordered this encounter  Medications  . lisdexamfetamine ([name]) 40 MG capsule    Sig: Take 1 capsule (40 mg total) by mouth every morning. If prior authorization required, please contact our office    Dispense:  30 capsule    Refill:  0  . Spacer/Aero-Holding Chambers (POCKET SPACER) DEVI    Sig: Use as directed w/ inhaler    Dispense:  1 each    Refill:  2  . fluticasone-salmeterol (ADVAIR HFA) 115-21 MCG/ACT inhaler    Sig: Inhale 2 puffs into the lungs 2 (two) times daily.    Dispense:  1 Inhaler    Refill:  11    Patient Instructions  Plan:  For young, otherwise healthy people with shortness of breath on exertion, we need to rule out cardiac disease. In your case, your symptoms are more concerning for pleuritic (chest wall/lung) problem or unusual presentation of exercise-induced asthma, but we need to be sure!   EKG today is okay, which is reassuring.   Will get you setup with a stress test, which gives an even better idea of cardiac function than the EKG.   Will also get you set up with labs to rule out other causes of shortness of breath such as anemia.   Will get chest Xray today.  We should consider a lung function test in the office to evaluate for severity of asthma, or we can start using spacer w/ albuterol inhaler prior to exercise and see if this resolves the issue. Will also add daily asthma control medication.  We may also consider restarting blood pressure medicines at a low dose.   If we cannot find a cause for your symptoms, we should consider referral to a specialist.   If your chest pain  worsens, or other concerns, please seek care by calling 911 or going to closest emergency room!   Additionally: will see if we can get [name] covered.       Follow-up plan: Return in about 1 week (around 10/15/2017) for recheck symptoms .                              ############################################ ############################################ ############################################ ############################################    Outpatient Encounter Medications as of 10/08/2017  Medication Sig  . Amphet-Dextroamphet 3-Bead ER 25 MG CP24 Take 25 mg by mouth every morning.  Marland Kitchen azelastine (ASTELIN) 0.1 % nasal spray Place 2 sprays into both nostrils 2 (two) times daily.  Marland Kitchen doxycycline (MONODOX) 100 MG capsule Take 1 capsule (100 mg total) by mouth 2 (two) times daily.  Marland Kitchen ketoconazole (NIZORAL) 2 % shampoo Apply 1 application topically 2 (two) times a week.  . methylPREDNISolone (MEDROL DOSEPAK) 4 MG TBPK tablet Take as directed by package insert  . PROAIR HFA 108 (90 Base) MCG/ACT inhaler Inhale 1-2 puffs into the lungs every 4 (four) hours as needed for wheezing or shortness of breath.  . ranitidine (ZANTAC) 150 MG tablet Take 150 mg by mouth 2 (two) times daily.   No facility-administered encounter medications on file as of 10/08/2017.    Allergies  Allergen Reactions  . Penicillins Shortness Of Breath      Review of Systems:  Constitutional: No recent illness  HEENT: No  headache, no vision change  Cardiac: +chest pain, No  pressure, No palpitations  Respiratory:  No  shortness of breath. No  Cough  Gastrointestinal: No  abdominal pain, no change on bowel habits  Musculoskeletal: No new myalgia/arthralgia  Skin: No  Rash  Hem/Onc: No  easy bruising/bleeding, No  abnormal lumps/bumps  Neurologic: No  weakness, No  Dizziness  Psychiatric: No  concerns with depression, No  concerns with anxiety  Exam:  BP 140/74 (BP  Location: Left Arm, Patient Position: Sitting, Cuff Size: Large)   Pulse 61   Temp 97.8 F (36.6 C) (Oral)   Wt 242 lb 14.4 oz (110.2 kg)   BMI 32.05 kg/m   Constitutional: VS see above. General Appearance: alert, well-developed, well-nourished, NAD  Eyes: Normal lids and conjunctive, non-icteric sclera  Ears, Nose, Mouth, Throat: MMM, Normal external inspection ears/nares/mouth/lips/gums.  Neck: No masses, trachea midline.   Respiratory: Normal respiratory effort. no wheeze, no rhonchi, no rales  Cardiovascular: S1/S2 normal, no murmur, no rub/gallop auscultated. RRR.   Musculoskeletal: Gait normal. Symmetric and independent movement of all extremities  Neurological: Normal balance/coordination. No tremor.  Skin: warm, dry, intact.   Psychiatric: Normal judgment/insight. Normal mood and affect. Oriented x3.   Visit summary with medication list and pertinent instructions was printed for patient to review, advised to alert [place] if any changes needed. All questions at time of visit were answered - patient instructed to contact office with any additional concerns. ER/RTC precautions were reviewed with the patient and understanding verbalized.  Follow-up plan: Return in about 1 week (around 10/15/2017) for recheck symptoms .    Please note: voice recognition software was used to produce this document, and typos may escape review. Please contact Dr. Sheppard Coil for any needed clarifications.

## 2017-10-09 ENCOUNTER — Telehealth: Payer: Self-pay

## 2017-10-09 LAB — COMPLETE METABOLIC PANEL WITH GFR
AG RATIO: 1.8 (calc) (ref 1.0–2.5)
ALT: 16 U/L (ref 9–46)
AST: 17 U/L (ref 10–40)
Albumin: 4.6 g/dL (ref 3.6–5.1)
Alkaline phosphatase (APISO): 55 U/L (ref 40–115)
BILIRUBIN TOTAL: 0.6 mg/dL (ref 0.2–1.2)
BUN: 12 mg/dL (ref 7–25)
CO2: 25 mmol/L (ref 20–32)
Calcium: 9.8 mg/dL (ref 8.6–10.3)
Chloride: 104 mmol/L (ref 98–110)
Creat: 0.87 mg/dL (ref 0.60–1.35)
GFR, EST NON AFRICAN AMERICAN: 113 mL/min/{1.73_m2} (ref >=60)
GFR, Est African American: 131 mL/min/{1.73_m2} (ref >=60)
GLOBULIN: 2.5 g/dL (ref 1.9–3.7)
Glucose, Bld: 62 mg/dL — ABNORMAL LOW (ref 65–99)
POTASSIUM: 4.4 mmol/L (ref 3.5–5.3)
SODIUM: 139 mmol/L (ref 135–146)
TOTAL PROTEIN: 7.1 g/dL (ref 6.1–8.1)

## 2017-10-09 LAB — TSH: TSH: 2.88 mIU/L (ref 0.40–4.50)

## 2017-10-09 LAB — CBC
HEMATOCRIT: 41.7 % (ref 38.5–50.0)
Hemoglobin: 14 g/dL (ref 13.2–17.1)
MCH: 26.1 pg — AB (ref 27.0–33.0)
MCHC: 33.6 g/dL (ref 32.0–36.0)
MCV: 77.7 fL — AB (ref 80.0–100.0)
MPV: 9.8 fL (ref 7.5–12.5)
PLATELETS: 333 10*3/uL (ref 140–400)
RBC: 5.37 10*6/uL (ref 4.20–5.80)
RDW: 13.5 % (ref 11.0–15.0)
WBC: 7.8 10*3/uL (ref 3.8–10.8)

## 2017-10-09 MED ORDER — LISDEXAMFETAMINE DIMESYLATE 40 MG PO CAPS: 40.0000 mg | ORAL_CAPSULE | ORAL | 0 refills | Status: DC

## 2017-10-09 NOTE — Telephone Encounter (Signed)
SENT REFERRAL TO SCHEDULING, NO NOTES

## 2017-10-10 NOTE — Progress Notes (Signed)
Pt has seen results on MyChart and message also sent for patient to call back if any questions.

## 2017-10-14 NOTE — Telephone Encounter (Signed)
Received a letter from [place] that [name] is not covered. The patient will have to try and fail Dextroamphetamine ER, or Methylphenidate ER. It does not give me the option to appeal.  Please advise.

## 2017-10-15 MED ORDER — AMPHET-DEXTROAMPHET 3-BEAD ER 25 MG PO CP24: 25.0000 mg | ORAL_CAPSULE | ORAL | 0 refills | Status: DC

## 2017-10-15 NOTE — Addendum Note (Signed)
Addended by: Nelson Chimes E on: 10/15/2017 12:48 PM   Modules accepted: Orders

## 2017-10-18 ENCOUNTER — Ambulatory Visit (INDEPENDENT_AMBULATORY_CARE_PROVIDER_SITE_OTHER): Payer: Managed Care, Other (non HMO)

## 2017-10-18 DIAGNOSIS — R0781 Pleurodynia: Secondary | ICD-10-CM | POA: Diagnosis not present

## 2017-10-18 DIAGNOSIS — R079 Chest pain, unspecified: Secondary | ICD-10-CM

## 2017-10-18 DIAGNOSIS — R0602 Shortness of breath: Secondary | ICD-10-CM

## 2017-10-18 LAB — EXERCISE TOLERANCE TEST
CSEPED: 13 min
CSEPEW: 15.9 METS
CSEPPHR: 162 {beats}/min
Exercise duration (sec): 24 s
MPHR: 187 {beats}/min
Percent HR: 86 %
RPE: 17
Rest HR: 64 {beats}/min

## 2017-10-19 ENCOUNTER — Encounter: Payer: Self-pay | Admitting: Osteopathic Medicine

## 2017-10-21 ENCOUNTER — Other Ambulatory Visit: Payer: Self-pay | Admitting: Osteopathic Medicine

## 2017-10-21 DIAGNOSIS — I10 Essential (primary) hypertension: Secondary | ICD-10-CM

## 2017-10-21 MED ORDER — CHLORTHALIDONE 25 MG PO TABS: 25.0000 mg | ORAL_TABLET | Freq: Every day | ORAL | 0 refills | Status: DC

## 2017-10-21 NOTE — Progress Notes (Signed)
Restart BP meds

## 2017-10-28 ENCOUNTER — Other Ambulatory Visit: Payer: Self-pay | Admitting: Physician Assistant

## 2017-10-29 NOTE — Telephone Encounter (Signed)
He shouldn't be out of these, [facility] sent a 30 days supply 2 weeks ago 10/15/17 - can we call the pharmay and see if pt requested these or if this is on some kind of auto-request thing?

## 2017-10-29 NOTE — Telephone Encounter (Signed)
Attempted to contact [facility] Staff currently out to lunch until 2:30 pm. Will try contacting pharmacy later on today.

## 2017-10-30 NOTE — Telephone Encounter (Signed)
As per pharmacist Maylon Cos, pt never picked up adderall rx issued on 10/15/17. Pharmacist will have rx available for p/u today. Pt has been updated.

## 2017-11-14 ENCOUNTER — Telehealth: Payer: Self-pay | Admitting: Osteopathic Medicine

## 2017-11-14 ENCOUNTER — Encounter: Payer: Self-pay | Admitting: Osteopathic Medicine

## 2017-11-14 ENCOUNTER — Ambulatory Visit: Payer: Managed Care, Other (non HMO) | Admitting: Osteopathic Medicine

## 2017-11-14 VITALS — BP 144/82 | HR 61 | Temp 98.3°F | Wt 247.2 lb

## 2017-11-14 DIAGNOSIS — F988 Other specified behavioral and emotional disorders with onset usually occurring in childhood and adolescence: Secondary | ICD-10-CM | POA: Diagnosis not present

## 2017-11-14 DIAGNOSIS — H938X2 Other specified disorders of left ear: Secondary | ICD-10-CM | POA: Diagnosis not present

## 2017-11-14 DIAGNOSIS — I10 Essential (primary) hypertension: Secondary | ICD-10-CM

## 2017-11-14 NOTE — Progress Notes (Signed)
HPI: Alan Soto. Alan Soto is a 32 y.o. male who  has a past medical history of Asthma (02/27/1992), Deviated septum (01/05/2016), Esophageal reflux (06/22/2015), Essential hypertension (06/22/2015), History of ADHD (07/21/2015), History of cholecystectomy (06/22/2015), Hypertension, Labral tear of shoulder (07/21/2015), Mild sleep apnea (07/21/2015), S/P vasectomy (06/22/2015), Seasonal allergies (06/22/2015), and Testosterone deficiency (06/22/2015).  he presents to [facility] today, 11/14/17,  for chief complaint of: Ear pressure  Ear pressure L ear ongoing - wife made this appt she was tired of him complaining about it haha.  He reports that it really no worse or changed from usual.  Has not followed up with ENT, last CT sinus 2017 reviewed, see imaging results.  Certainly some chronic sinus issues need to be addressed, possible surgical intervention.  He thinks he has been using Afrin more days than not.  Also using antihistamine nasal spray, Benadryl nightly, alternating between Claritin, Allegra, Zyrtec.  He is worried about blood pressure being higher since he has had to be on Adderall instead of [name], his insurance was not covering the [name].  He is also noticed weight gain on this medication despite no change in diet and he has been diligent about going to the gym most days of the week for at least an hour or two.  Shortness of breath with working out is still present but a bit better, stress test showed no abnormality other than elevated blood pressure with exercise.  Marland Kitchen   Past medical, surgical, social and family history reviewed:  Patient Active Problem List   Diagnosis Date Noted  . Benign mole 06/04/2017  . Chronic pain of left ankle 06/04/2017  . Mild intermittent asthma with exacerbation 06/04/2017  . Scalp irritation 09/26/2016  . Recurrent sinusitis 09/26/2016  . Deviated septum 01/05/2016  . Adult attention deficit disorder 12/18/2015  .  Eosinophilia 07/22/2015  . History of ADHD 07/21/2015  . Mild sleep apnea 07/21/2015  . Labral tear of shoulder 07/21/2015  . Testosterone deficiency 06/22/2015  . Chronic fatigue 06/22/2015  . Seasonal allergies 06/22/2015  . Esophageal reflux 06/22/2015  . History of anemia 06/22/2015  . History of cholecystectomy 06/22/2015  . S/P vasectomy 06/22/2015  . Essential hypertension 06/22/2015    Past Surgical History:  Procedure Laterality Date  . CHOLECYSTECTOMY  01/2015  . SHOULDER SURGERY  11/2014    Social History   Tobacco Use  . Smoking status: Never Smoker  . Smokeless tobacco: Never Used  Substance Use Topics  . Alcohol use: Not on file    No family history on file.   Current medication list and allergy/intolerance information reviewed:    Current Outpatient Medications  Medication Sig Dispense Refill  . Amphet-Dextroamphet 3-Bead ER 25 MG CP24 Take 25 mg by mouth every morning. 30 capsule 0  . azelastine (ASTELIN) 0.1 % nasal spray Place 2 sprays into both nostrils 2 (two) times daily. 30 mL 12  . chlorthalidone (HYGROTON) 25 MG tablet Take 1 tablet (25 mg total) by mouth daily. 90 tablet 0  . doxycycline (MONODOX) 100 MG capsule Take 1 capsule (100 mg total) by mouth 2 (two) times daily. 14 capsule 0  . fluticasone-salmeterol (ADVAIR HFA) 115-21 MCG/ACT inhaler Inhale 2 puffs into the lungs 2 (two) times daily. 1 Inhaler 11  . ketoconazole (NIZORAL) 2 % shampoo Apply 1 application topically 2 (two) times a week. 120 mL 0  . methylPREDNISolone (MEDROL DOSEPAK) 4 MG TBPK tablet Take as directed by package insert 20 tablet 0  .  PROAIR HFA 108 (90 Base) MCG/ACT inhaler Inhale 1-2 puffs into the lungs every 4 (four) hours as needed for wheezing or shortness of breath. 2 Inhaler 11  . ranitidine (ZANTAC) 150 MG tablet Take 150 mg by mouth 2 (two) times daily.    Marland Kitchen Spacer/Aero-Holding Chambers (POCKET SPACER) DEVI Use as directed w/ inhaler 1 each 2   No current  facility-administered medications for this visit.     Allergies  Allergen Reactions  . Penicillins Shortness Of Breath      Review of Systems:  Constitutional:  No  fever, no chills, No recent illness, No unintentional weight changes. No significant fatigue.   HEENT: No  headache, no vision change, no hearing change, No sore throat, +chronic sinus pressure  Cardiac: No  chest pain, No  pressure, No palpitations  Respiratory:  No  shortness of breath. No  Cough  Gastrointestinal: No  abdominal pain, No  nausea, No  vomiting,  No  blood in stool, No  diarrhea  Musculoskeletal: No new myalgia/arthralgia  Skin: No  Rash  Neurologic: No  weakness, No  dizziness  Exam:  BP (!) 147/74 (BP Location: Left Arm, Patient Position: Sitting, Cuff Size: Normal)   Pulse 60   Temp 98.3 F (36.8 C) (Oral)   Wt 247 lb 3.2 oz (112.1 kg)   BMI 32.61 kg/m  Recheck 144/82  Constitutional: VS see above. General Appearance: alert, well-developed, well-nourished, NAD  Eyes: Normal lids and conjunctive, non-icteric sclera  Ears, Nose, Mouth, Throat: MMM, Normal external inspection ears/nares/mouth/lips/gums. TM normal bilaterally. Pharynx/tonsils no erythema, no exudate. Nasal mucosa normal.   Neck: No masses, trachea midline. No tenderness/mass appreciated. No lymphadenopathy  Respiratory: Normal respiratory effort. no wheeze, no rhonchi, no rales  Cardiovascular: S1/S2 normal, no murmur, no rub/gallop auscultated. RRR. No lower extremity edema.   Gastrointestinal: Nontender, no masses. Bowel sounds normal.  Musculoskeletal: Gait normal.   Neurological: Normal balance/coordination. No tremor.   Skin: warm, dry, intact.   Psychiatric: Normal judgment/insight. Normal mood and affect.    ASSESSMENT/PLAN:   Ear pressure, left - Long-standing, not worse lately, I think okay to monitor, follow with ENT to consider repeat CT sinus  Essential hypertension  Adult attention deficit  disorder - [name] was not covered by his insurance, he really preferred this to the Adderall.  Will contact our prior authorization coordinator    Patient Instructions  Plan:   I have reached out to the employee who handles our prior authorizations, I will have her look into the issue with your insurance and getting [name] covered.    Can expect to hear back from her or from your pharmacy within a week.    I'm hoping that we can get the [name] covered and not have to make any adjustments to blood pressure medication, but if the blood pressure is not looking better when you are back on the [name], we may want to consider follow-up with a cardiologist   The ear looks okay, as far as ear pressure goes I think we need to do what we can to decrease sinus inflammation (see below).  Would still recommend follow-up with ENT, to see whether surgery or repeat imaging of the sinuses is needed.  In the meantime...  continue the azelastine nasal spray  would strongly consider adding Flonase or Nasonex steroid nasal spray,   oxymetolazone/Afrin nasal spray is good for limited use but frequent use of this medicine can cause significant rebound congestion problems so I do not recommend  it as a daily therapy.  Okay to continue Benadryl in the evenings  Okay to continue other longer acting antihistamine in the daytime, Claritin, Allegra, Zyrtec, or similar and you can rotate these as you find helpful      Visit summary with medication list and pertinent instructions was printed for patient to review. All questions at time of visit were answered - patient instructed to contact office with any additional concerns. ER/RTC precautions were reviewed with the patient.   Follow-up plan: Return in about 2 weeks (around 11/28/2017) for recheck blood pressure - will try to get [name] approved for you.    Please note: voice recognition software was used to produce this document, and typos may escape  review. Please contact Dr. Sheppard Coil for any needed clarifications.

## 2017-11-14 NOTE — Patient Instructions (Addendum)
Plan:   I have reached out to the employee who handles our prior authorizations, I will have her look into the issue with your insurance and getting [name] covered.    Can expect to hear back from her or from your pharmacy within a week.    I'm hoping that we can get the [name] covered and not have to make any adjustments to blood pressure medication, but if the blood pressure is not looking better when you are back on the [name], we may want to consider follow-up with a cardiologist   The ear looks okay, as far as ear pressure goes I think we need to do what we can to decrease sinus inflammation (see below).  Would still recommend follow-up with ENT, to see whether surgery or repeat imaging of the sinuses is needed.  In the meantime...  continue the azelastine nasal spray  would strongly consider adding Flonase or Nasonex steroid nasal spray,   oxymetolazone/Afrin nasal spray is good for limited use but frequent use of this medicine can cause significant rebound congestion problems so I do not recommend it as a daily therapy.  Okay to continue Benadryl in the evenings  Okay to continue other longer acting antihistamine in the daytime, Claritin, Allegra, Zyrtec, or similar and you can rotate these as you find helpful

## 2017-11-14 NOTE — Telephone Encounter (Signed)
PA has been started per Dr. Sheppard Coil recommendation. Waiting on response from insurance.

## 2017-11-14 NOTE — Telephone Encounter (Signed)
Per patient he has been on Focalin and Ritalin as a child and it did not help at all. Patient reports he would like to go on [name] since his BP was not elevated and it has been on Adderall.

## 2017-11-14 NOTE — Telephone Encounter (Signed)
-----   Message from Emeterio Reeve, DO sent at 11/14/2017  7:30 AM EDT ----- Regarding: PA ADHD meds thanks!  Is there anyway we can initiate a prior authorization to get [name] covered for this patient?  Currently on Adderall, he much preferred the [name]

## 2017-11-18 NOTE — Telephone Encounter (Signed)
Received fax that additional information was needed for patients [name]. Information has been sent to [facility]. Waiting on response.

## 2017-11-21 NOTE — Telephone Encounter (Signed)
[name] has been denied due to medical necessity. Form placed in providers box.

## 2017-11-27 ENCOUNTER — Telehealth: Payer: Self-pay

## 2017-11-27 NOTE — Telephone Encounter (Signed)
Pt's wife Alan Soto called requesting med RF for adderall. As per wife, pt was unable to get vyvanse rx. Ins did not cover med. PA was obtained, but denied. Per provider's last OV - "pt was worried about bp being higher since he has had to be on Adderall". 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 Pls advise. Requesting rx to be sent to [place].

## 2017-11-27 NOTE — Telephone Encounter (Signed)
Patient should be actively monitoring his BP at home Goal <130/80 Confirm he is taking Chlorthalidone daily See if he has any home BP readings If his BP is not at goal, his options are to reduce his dose of Adderall or switch to a non-stimulant medication

## 2017-11-27 NOTE — Telephone Encounter (Signed)
Will adderall rx be sent to local pharmacy? Pls advise, thanks.

## 2017-11-28 MED ORDER — AMPHET-DEXTROAMPHET 3-BEAD ER 25 MG PO CP24: 25.0000 mg | ORAL_CAPSULE | ORAL | 0 refills | Status: DC

## 2017-11-28 NOTE — Telephone Encounter (Signed)
Pt has been updated adderall rx sent to local pharmacy. Pt was informed to continue monitoring/logging home bp readings.

## 2017-11-28 NOTE — Telephone Encounter (Signed)
Refill sent Patient understands this medication can raise blood pressure and heart rate, and accepts these risks

## 2017-11-28 NOTE — Telephone Encounter (Signed)
Spoke to pt's wife - states that he's been monitoring his bp at home. Has been in range. She states pt request that covering provider sends a 1 wk rx for Adderall. Pt is worried about going into withdrawals. 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 Pls advise, thanks.

## 2017-11-28 NOTE — Telephone Encounter (Signed)
If his BP is not controlled at home on his Chlorthalidone, then I am not comfortable sending in a refill. We can wait until Dr. Sheppard Coil returns to the office tomorrow

## 2017-11-29 NOTE — Telephone Encounter (Signed)
Noted, thakns for handling this!

## 2018-01-02 ENCOUNTER — Encounter: Payer: Self-pay | Admitting: Osteopathic Medicine

## 2018-01-02 ENCOUNTER — Ambulatory Visit: Payer: Managed Care, Other (non HMO) | Admitting: Osteopathic Medicine

## 2018-01-02 VITALS — BP 130/82 | HR 61 | Temp 97.6°F | Wt 244.8 lb

## 2018-01-02 DIAGNOSIS — F988 Other specified behavioral and emotional disorders with onset usually occurring in childhood and adolescence: Secondary | ICD-10-CM

## 2018-01-02 DIAGNOSIS — S86119A Strain of other muscle(s) and tendon(s) of posterior muscle group at lower leg level, unspecified leg, initial encounter: Secondary | ICD-10-CM | POA: Diagnosis not present

## 2018-01-02 DIAGNOSIS — J329 Chronic sinusitis, unspecified: Secondary | ICD-10-CM

## 2018-01-02 DIAGNOSIS — I10 Essential (primary) hypertension: Secondary | ICD-10-CM | POA: Diagnosis not present

## 2018-01-02 MED ORDER — LISDEXAMFETAMINE DIMESYLATE 40 MG PO CAPS: 40.0000 mg | ORAL_CAPSULE | ORAL | 0 refills | Status: DC

## 2018-01-02 NOTE — Patient Instructions (Signed)
Will do a bit more taking and see what we can do about getting the [name] covered for you, I am very sorry that this seems to be taking so long and I will try to address it tomorrow on my admin time if I need to personally call the insurance company to get this approved.  I do not understand why they are not getting it covered for you but will work on this.  We will go ahead and send referral to different ear nose and throat specialist.  If compression/Ace bandaging and home stretches are not helping the calves/Achilles area, please schedule a follow-up with one of our sports medicine physicians

## 2018-01-02 NOTE — Addendum Note (Signed)
Addended by: Maryla Morrow on: 01/02/2018 03:55 PM   Modules accepted: Orders

## 2018-01-02 NOTE — Progress Notes (Signed)
HPI: Alan Soto. Stickels is a 32 y.o. male who  has a past medical history of Asthma (02/27/1992), Deviated septum (01/05/2016), Esophageal reflux (06/22/2015), Essential hypertension (06/22/2015), History of ADHD (07/21/2015), History of cholecystectomy (06/22/2015), Hypertension, Labral tear of shoulder (07/21/2015), Mild sleep apnea (07/21/2015), S/P vasectomy (06/22/2015), Seasonal allergies (06/22/2015), and Testosterone deficiency (06/22/2015).  he presents to [facility] today, 01/02/18,  for chief complaint of:  ADD meds  We were not able to get the [name] covered, despite the fact that he has failed treatment with Adderall and Ritalin.  The pressure is much better on the [name] that it is with the Adderall.  Adderall and Ritalin were not helping symptoms as much as the [name] was.  We will try again to get this covered for him  Also reports soreness in calf/Achilles bilaterally, he has been working out a good bit, trying to stretch these a little bit but has not done much.  No icing, he wears compression pants when he is working out but no other compression socks or wraps during the day.  He also requests referral to different ENT specialist, did not feel he had a good connection with [facility]  Past medical history, surgical history, and family history reviewed.  Current medication list and allergy/intolerance information reviewed.   (See remainder of HPI, ROS, Phys Exam below)     ASSESSMENT/PLAN:   Adult attention deficit disorder - Failed treatment with 2 other therapies, will appeal with insurance again to cover [name]  Essential hypertension  Strain of gastrocnemius muscle, unspecified laterality, initial encounter - Advised compression socks/Ace bandage wraps and instructed on stretching exercises.  Follow-up with sports medicine if not better  Recurrent sinusitis - Plan: Ambulatory referral to ENT      Patient Instructions  Will do a bit more  taking and see what we can do about getting the [name] covered for you, I am very sorry that this seems to be taking so long and I will try to address it tomorrow on my admin time if I need to personally call the insurance company to get this approved.  I do not understand why they are not getting it covered for you but will work on this.  We will go ahead and send referral to different ear nose and throat specialist.  If compression/Ace bandaging and home stretches are not helping the calves/Achilles area, please schedule a follow-up with one of our sports medicine physicians      Follow-up plan: Return for recheck BP depending on if we can get [name] for you - will call .                        ############################################ ############################################ ############################################ ############################################    Outpatient Encounter Medications as of 01/02/2018  Medication Sig  . azelastine (ASTELIN) 0.1 % nasal spray Place 2 sprays into both nostrils 2 (two) times daily.  . chlorthalidone (HYGROTON) 25 MG tablet Take 1 tablet (25 mg total) by mouth daily.  . fluticasone-salmeterol (ADVAIR HFA) 115-21 MCG/ACT inhaler Inhale 2 puffs into the lungs 2 (two) times daily.  Marland Kitchen ketoconazole (NIZORAL) 2 % shampoo Apply 1 application topically 2 (two) times a week.  . methylPREDNISolone (MEDROL DOSEPAK) 4 MG TBPK tablet Take as directed by package insert  . PROAIR HFA 108 (90 Base) MCG/ACT inhaler Inhale 1-2 puffs into the lungs every 4 (four) hours as needed for wheezing or shortness of breath.  . ranitidine (ZANTAC)  150 MG tablet Take 150 mg by mouth 2 (two) times daily.  Marland Kitchen Spacer/Aero-Holding Chambers (POCKET SPACER) DEVI Use as directed w/ inhaler  . Amphet-Dextroamphet 3-Bead ER 25 MG CP24 Take 25 mg by mouth every morning.   No facility-administered encounter medications on file as of 01/02/2018.     Allergies  Allergen Reactions  . Penicillins Shortness Of Breath      Review of Systems:  Constitutional: No recent illness  HEENT: No  headache, no vision change  Cardiac: No  chest pain, No  pressure, No palpitations  Respiratory:  No  shortness of breath. No  Cough  Gastrointestinal: No  abdominal pain, no change on bowel habits  Musculoskeletal: +new myalgia/arthralgia  Skin: No  Rash  Hem/Onc: No  easy bruising/bleeding, No  abnormal lumps/bumps  Neurologic: No  weakness, No  Dizziness  Psychiatric: No  concerns with depression, No  concerns with anxiety  Exam:  BP (!) 158/72 (BP Location: Left Arm, Patient Position: Sitting, Cuff Size: Normal)   Pulse 68   Temp 97.6 F (36.4 C) (Oral)   Wt 244 lb 12.8 oz (111 kg)   BMI 32.30 kg/m   Constitutional: VS see above. General Appearance: alert, well-developed, well-nourished, NAD  Eyes: Normal lids and conjunctive, non-icteric sclera  Ears, Nose, Mouth, Throat: MMM, Normal external inspection ears/nares/mouth/lips/gums.  Neck: No masses, trachea midline.   Respiratory: Normal respiratory effort. no wheeze, no rhonchi, no rales  Cardiovascular: S1/S2 normal, no murmur, no rub/gallop auscultated. RRR.   Musculoskeletal: Gait normal. Symmetric and independent movement of all extremities.  Negative Homans sign bilaterally.  Tightness in gastrocnemius/hamstring and Achilles with knee extension and dorsiflexion bilaterally. No LE edema  Neurological: Normal balance/coordination. No tremor.  Skin: warm, dry, intact.   Psychiatric: Normal judgment/insight. Normal mood and affect. Oriented x3.   Visit summary with medication list and pertinent instructions was printed for patient to review, advised to alert [place] if any changes needed. All questions at time of visit were answered - patient instructed to contact office with any additional concerns. ER/RTC precautions were reviewed with the patient and understanding  verbalized.   Follow-up plan: Return for recheck BP depending on if we can get [name] for you - will call .     Please note: voice recognition software was used to produce this document, and typos may escape review. Please contact Dr. Sheppard Coil for any needed clarifications.

## 2018-01-03 ENCOUNTER — Encounter: Payer: Self-pay | Admitting: Osteopathic Medicine

## 2018-01-06 MED ORDER — AMPHET-DEXTROAMPHET 3-BEAD ER 25 MG PO CP24: 25.0000 mg | ORAL_CAPSULE | ORAL | 0 refills | Status: DC

## 2018-01-06 NOTE — Addendum Note (Signed)
Addended by: Maryla Morrow on: 01/06/2018 09:31 AM   Modules accepted: Orders

## 2018-01-27 ENCOUNTER — Encounter: Payer: Self-pay | Admitting: Osteopathic Medicine

## 2018-01-28 MED ORDER — AMPHET-DEXTROAMPHET 3-BEAD ER 25 MG PO CP24: 25.0000 mg | ORAL_CAPSULE | ORAL | 0 refills | Status: DC

## 2018-01-29 ENCOUNTER — Other Ambulatory Visit: Payer: Self-pay

## 2018-01-29 DIAGNOSIS — I10 Essential (primary) hypertension: Secondary | ICD-10-CM

## 2018-01-29 MED ORDER — CHLORTHALIDONE 25 MG PO TABS: 25.0000 mg | ORAL_TABLET | Freq: Every day | ORAL | 1 refills | Status: DC

## 2018-02-04 ENCOUNTER — Encounter: Payer: Self-pay | Admitting: Osteopathic Medicine

## 2018-02-04 ENCOUNTER — Ambulatory Visit: Payer: Managed Care, Other (non HMO) | Admitting: Osteopathic Medicine

## 2018-02-04 VITALS — BP 142/81 | HR 76 | Temp 98.6°F | Wt 244.0 lb

## 2018-02-04 DIAGNOSIS — G43909 Migraine, unspecified, not intractable, without status migrainosus: Secondary | ICD-10-CM | POA: Diagnosis not present

## 2018-02-04 MED ORDER — DEXAMETHASONE SODIUM PHOSPHATE 4 MG/ML IJ SOLN
4.0000 mg | Freq: Once | INTRAMUSCULAR | Status: AC
  Administered 2018-02-04: 4 mg via INTRAMUSCULAR

## 2018-02-04 MED ORDER — PROMETHAZINE HCL 25 MG PO TABS: 25.0000 mg | ORAL_TABLET | Freq: Four times a day (QID) | ORAL | 0 refills | Status: DC | PRN

## 2018-02-04 MED ORDER — SUMATRIPTAN SUCCINATE 50 MG PO TABS: 50.0000 mg | ORAL_TABLET | ORAL | 0 refills | Status: DC | PRN

## 2018-02-04 MED ORDER — KETOROLAC TROMETHAMINE 60 MG/2ML IM SOLN
60.0000 mg | Freq: Once | INTRAMUSCULAR | Status: AC
  Administered 2018-02-04: 60 mg via INTRAMUSCULAR

## 2018-02-04 NOTE — Progress Notes (Signed)
HPI: Alan Soto. Alan Soto is a 32 y.o. male who  has a past medical history of Asthma (02/27/1992), Deviated septum (01/05/2016), Esophageal reflux (06/22/2015), Essential hypertension (06/22/2015), History of ADHD (07/21/2015), History of cholecystectomy (06/22/2015), Hypertension, Labral tear of shoulder (07/21/2015), Mild sleep apnea (07/21/2015), S/P vasectomy (06/22/2015), Seasonal allergies (06/22/2015), and Testosterone deficiency (06/22/2015).  he presents to [facility] today, 02/04/18,  for chief complaint of: Migraine  Migraine yesterday worse, started 2 days ago, starting to subside now, OTC meds were not helpful. Previously on some kind of dissolvable abortive medication, not sure which. Gets maybe 2 migraines a year, unilateral to bilateral, intense throbbing, associated nausea and photophobia.   Sinuses are a chronic issue but seem to be getting worse lately, he got a call from ENT but hasn't called back to set up appt.       Past medical, surgical, social and family history reviewed and updated as necessary.   Current medication list and allergy/intolerance information reviewed:    Current Outpatient Medications  Medication Sig Dispense Refill  . Amphet-Dextroamphet 3-Bead ER 25 MG CP24 Take 25 mg by mouth every morning. 30 capsule 0  . azelastine (ASTELIN) 0.1 % nasal spray Place 2 sprays into both nostrils 2 (two) times daily. 30 mL 12  . chlorthalidone (HYGROTON) 25 MG tablet Take 1 tablet (25 mg total) by mouth daily. 90 tablet 1  . famotidine (PEPCID) 10 MG tablet Take 10 mg by mouth 2 (two) times daily.    . fluticasone-salmeterol (ADVAIR HFA) 115-21 MCG/ACT inhaler Inhale 2 puffs into the lungs 2 (two) times daily. 1 Inhaler 11  . ketoconazole (NIZORAL) 2 % shampoo Apply 1 application topically 2 (two) times a week. 120 mL 0  . PROAIR HFA 108 (90 Base) MCG/ACT inhaler Inhale 1-2 puffs into the lungs every 4 (four) hours as needed for wheezing or  shortness of breath. 2 Inhaler 11  . Spacer/Aero-Holding Chambers (POCKET SPACER) DEVI Use as directed w/ inhaler 1 each 2  . methylPREDNISolone (MEDROL DOSEPAK) 4 MG TBPK tablet Take as directed by package insert (Patient not taking: Reported on 02/04/2018) 20 tablet 0  . ranitidine (ZANTAC) 150 MG tablet Take 150 mg by mouth 2 (two) times daily.     No current facility-administered medications for this visit.     Allergies  Allergen Reactions  . Penicillins Shortness Of Breath      Review of Systems:  Constitutional:  No  fever, no chills, No recent illness,   HEENT: +headache, no vision change, no hearing change, No sore throat, +sinus pressure  Cardiac: No  chest pain, No  pressure, No palpitations  Respiratory:  No  shortness of breath. No  Cough  Gastrointestinal: No  abdominal pain, No  nausea, No  vomiting,  No  blood in stool, No  diarrhea  Musculoskeletal: No new myalgia/arthralgia  Skin: No  Rash  Neurologic: No  weakness, No  dizziness  Exam:  BP (!) 142/81 (BP Location: Left Arm, Patient Position: Sitting, Cuff Size: Large)   Pulse 76   Temp 98.6 F (37 C) (Oral)   Wt 244 lb (110.7 kg)   BMI 32.19 kg/m   Constitutional: VS see above. General Appearance: alert, well-developed, well-nourished, NAD  Eyes: Normal lids and conjunctive, non-icteric sclera, EOMI, PERRL, no nystagmus  Ears, Nose, Mouth, Throat: MMM, Normal external inspection ears/nares/mouth/lips/gums. TM normal bilaterally. Pharynx/tonsils no erythema, no exudate. Nasal mucosa normal.   Neck: No masses, trachea midline. No tenderness/mass appreciated.  No lymphadenopathy  Respiratory: Normal respiratory effort. no wheeze, no rhonchi, no rales  Cardiovascular: S1/S2 normal, no murmur, no rub/gallop auscultated. RRR. No lower extremity edema.   Musculoskeletal: Gait normal.   Neurological: Normal balance/coordination. No tremor.   Skin: warm, dry, intact.   Psychiatric: Normal  judgment/insight. Normal mood and affect.     ASSESSMENT/PLAN: The encounter diagnosis was Migraine without status migrainosus, not intractable, unspecified migraine type.   Meds ordered this encounter  Medications  . SUMAtriptan (IMITREX) 50 MG tablet    Sig: Take 1 tablet (50 mg total) by mouth every 2 (two) hours as needed for migraine. Repeat in 2 hours if persists. MAX 3 tablets in 24 hours.    Dispense:  10 tablet    Refill:  0  . promethazine (PHENERGAN) 25 MG tablet    Sig: Take 1 tablet (25 mg total) by mouth every 6 (six) hours as needed for nausea or vomiting (with migraine).    Dispense:  30 tablet    Refill:  0  . dexamethasone (DECADRON) injection 4 mg  . ketorolac (TORADOL) injection 60 mg      Patient Instructions   Please call to set up ENT consultation: Dr Minna Merritts at [phone], referral is already in place   Meds ordered this encounter  Medications  . SUMAtriptan (IMITREX) 50 MG tablet    Sig: Take 1 tablet (50 mg total) by mouth every 2 (two) hours as needed for migraine. Repeat in 2 hours if persists. MAX 3 tablets in 24 hours.    Dispense:  10 tablet    Refill:  0  . promethazine (PHENERGAN) 25 MG tablet    Sig: Take 1 tablet (25 mg total) by mouth every 6 (six) hours as needed for nausea or vomiting (with migraine).    Dispense:  30 tablet    Refill:  0   Please keep a record of headaches: see attached for sample headache diary, there are also migraine apps you can use on a smartphone. If headaches worsening or more frequent, please let me know!         Visit summary with medication list and pertinent instructions was printed for patient to review. All questions at time of visit were answered - patient instructed to contact office with any additional concerns or updates. ER/RTC precautions were reviewed with the patient.   Follow-up plan: Return if symptoms worsen or fail to improve.   Please note: voice recognition software was used  to produce this document, and typos may escape review. Please contact Dr. Sheppard Coil for any needed clarifications.

## 2018-02-04 NOTE — Patient Instructions (Addendum)
Please call to set up ENT consultation: Dr Minna Merritts at [phone], referral is already in place   Meds ordered this encounter  Medications  . SUMAtriptan (IMITREX) 50 MG tablet    Sig: Take 1 tablet (50 mg total) by mouth every 2 (two) hours as needed for migraine. Repeat in 2 hours if persists. MAX 3 tablets in 24 hours.    Dispense:  10 tablet    Refill:  0  . promethazine (PHENERGAN) 25 MG tablet    Sig: Take 1 tablet (25 mg total) by mouth every 6 (six) hours as needed for nausea or vomiting (with migraine).    Dispense:  30 tablet    Refill:  0   Please keep a record of headaches: see attached for sample headache diary, there are also migraine apps you can use on a smartphone. If headaches worsening or more frequent, please let me know!

## 2018-02-10 ENCOUNTER — Telehealth: Payer: Self-pay | Admitting: Osteopathic Medicine

## 2018-02-10 ENCOUNTER — Telehealth: Payer: Self-pay

## 2018-02-10 NOTE — Telephone Encounter (Signed)
Left Vm for Pt's wife with status update and referral contact information.

## 2018-02-10 NOTE — Telephone Encounter (Signed)
Pt's wife called requesting a rx for antibiotics. Pt is still experiencing headaches and sinus congestion. Pt has an appt with ENT in 1 mth, but does not want to continue having episodes of symptoms. If appropriate, pls send rx to local pharmacy. Requesting to expedite request as pt will be traveling to [place] tomorrow. Pls advise, thanks.

## 2018-02-10 NOTE — Telephone Encounter (Signed)
NEEDS TO GO TO ENT Referral is in place

## 2018-02-10 NOTE — Telephone Encounter (Signed)
Wife, Mateo Flow, called in wanting to know if patient can get something sent in to the pharmacy. States that he is still having a lot of sinus issues. Patient was seen 02/04/18. Offered an appointment for tomorrow but Wife declined due to patient going out of town. Please advise.

## 2018-02-11 NOTE — Telephone Encounter (Signed)
Left a detailed vm msg for pt regarding provider's note. Call back info provided.

## 2018-02-11 NOTE — Telephone Encounter (Signed)
I can send a course antibiotics and a steroid burst this once, but really don't advise additionally repeating round after round of antibiotics until he sees ENT. The last thing we want to do is create a resistant infection with antibiotic overuse. If he's out of town i'll need to know where to send the Rx.

## 2018-03-03 ENCOUNTER — Encounter: Payer: Self-pay | Admitting: Osteopathic Medicine

## 2018-03-03 MED ORDER — AMPHET-DEXTROAMPHET 3-BEAD ER 25 MG PO CP24: 25.0000 mg | ORAL_CAPSULE | ORAL | 0 refills | Status: DC

## 2018-03-05 ENCOUNTER — Telehealth: Payer: Self-pay

## 2018-03-05 NOTE — Telephone Encounter (Signed)
Pt's wife called regarding pt's most recent visit with ENT specialist. Pt is frustrated with specialist. Feels that specialist is not helping pt daily sinus issues and cough. Per pt, specialist refused to discuss treatment plans and/or surgery until patient is doing better. He was given a cough syrup medicine for symptoms at his last visit. Pt does not think this is the right ENT specialist for him and is requesting a new ENT referral. Pls advise, thanks.

## 2018-03-05 NOTE — Telephone Encounter (Signed)
I've sent him to two ENT offices at this point and he was unsatisfied with both.   Please call patient and see if you can speak to him and not his wife. If he wants yet another referral, he should do some research and give me a name of a covered ENT specialist he'd like to see and we can refer him, or he should clarify with this specialist what their concerns are regarding waiting to discuss surgery.

## 2018-03-05 NOTE — Telephone Encounter (Signed)
Left a detailed vm msg for pt/pt's wife regarding provider's note/recommendation. Direct call back info provided.

## 2018-03-28 ENCOUNTER — Telehealth: Payer: Self-pay

## 2018-03-28 MED ORDER — AMPHET-DEXTROAMPHET 3-BEAD ER 25 MG PO CP24: 25.0000 mg | ORAL_CAPSULE | ORAL | 0 refills | Status: DC

## 2018-03-28 NOTE — Telephone Encounter (Signed)
Pt's wife called requesting an early refill for adderall for pt. He will be leaving out of the country tomorrow and will be abroad for a month or so. Spouse mentioned she will be also leaving a message with Triage. Pls advise, thanks.

## 2018-03-28 NOTE — Telephone Encounter (Signed)
Alan Soto is going out of the country to [place] in a couple days. He needs an early refill on Adderall. He will be there until February the 13th. Please advise.

## 2018-03-28 NOTE — Telephone Encounter (Signed)
See other phone note - I routed to [place] and the pool

## 2018-03-28 NOTE — Telephone Encounter (Signed)
Left message advising of recommendations.  

## 2018-03-28 NOTE — Telephone Encounter (Signed)
Please call patient/wife and let them know that our policy particularly for controlled substances for refills requires more advanced notice, typically I am out of the office on Friday afternoons and he may not have been able to get this done.  Refill was sent today but this may not be able to be done in the future if late notice given.  It is also not necessary to leave multiple voicemails as this creates duplicate work among our staff and slows things down for other patients.   In the future, please let us know 5 business days in advance via phone or MyChart or have the pharmacy request a refill on your behalf, and we should be able to get the prescription taken care of.

## 2018-05-01 ENCOUNTER — Encounter: Payer: Self-pay | Admitting: Osteopathic Medicine

## 2018-05-01 DIAGNOSIS — R238 Other skin changes: Secondary | ICD-10-CM

## 2018-05-02 ENCOUNTER — Encounter: Payer: Self-pay | Admitting: Osteopathic Medicine

## 2018-05-02 DIAGNOSIS — R238 Other skin changes: Secondary | ICD-10-CM

## 2018-05-02 MED ORDER — AMPHET-DEXTROAMPHET 3-BEAD ER 25 MG PO CP24: 25.0000 mg | ORAL_CAPSULE | ORAL | 0 refills | Status: DC

## 2018-05-02 MED ORDER — KETOCONAZOLE 2 % EX SHAM: 1.0000 "application " | MEDICATED_SHAMPOO | CUTANEOUS | 0 refills | Status: DC

## 2018-05-29 ENCOUNTER — Encounter: Payer: Self-pay | Admitting: Osteopathic Medicine

## 2018-05-29 MED ORDER — AMPHET-DEXTROAMPHET 3-BEAD ER 25 MG PO CP24: 25.0000 mg | ORAL_CAPSULE | ORAL | 0 refills | Status: DC

## 2018-07-03 ENCOUNTER — Encounter: Payer: Self-pay | Admitting: Osteopathic Medicine

## 2018-07-04 MED ORDER — AMPHET-DEXTROAMPHET 3-BEAD ER 25 MG PO CP24: 25.0000 mg | ORAL_CAPSULE | ORAL | 0 refills | Status: DC

## 2018-07-21 ENCOUNTER — Other Ambulatory Visit: Payer: Self-pay | Admitting: Osteopathic Medicine

## 2018-07-21 DIAGNOSIS — I10 Essential (primary) hypertension: Secondary | ICD-10-CM

## 2018-07-25 ENCOUNTER — Other Ambulatory Visit: Payer: Self-pay | Admitting: Osteopathic Medicine

## 2018-07-25 MED ORDER — SUMATRIPTAN SUCCINATE 50 MG PO TABS: ORAL_TABLET | ORAL | 0 refills | Status: DC

## 2018-08-04 ENCOUNTER — Encounter: Payer: Self-pay | Admitting: Osteopathic Medicine

## 2018-08-05 ENCOUNTER — Ambulatory Visit (INDEPENDENT_AMBULATORY_CARE_PROVIDER_SITE_OTHER): Payer: Managed Care, Other (non HMO) | Admitting: Osteopathic Medicine

## 2018-08-05 ENCOUNTER — Encounter: Payer: Self-pay | Admitting: Osteopathic Medicine

## 2018-08-05 VITALS — BP 142/71 | HR 69 | Temp 98.2°F | Wt 220.2 lb

## 2018-08-05 DIAGNOSIS — I10 Essential (primary) hypertension: Secondary | ICD-10-CM

## 2018-08-05 DIAGNOSIS — F988 Other specified behavioral and emotional disorders with onset usually occurring in childhood and adolescence: Secondary | ICD-10-CM | POA: Diagnosis not present

## 2018-08-05 MED ORDER — CHLORTHALIDONE 25 MG PO TABS: 25.0000 mg | ORAL_TABLET | Freq: Every day | ORAL | 3 refills | Status: DC

## 2018-08-05 MED ORDER — AMPHET-DEXTROAMPHET 3-BEAD ER 25 MG PO CP24: 25.0000 mg | ORAL_CAPSULE | ORAL | 0 refills | Status: DC

## 2018-08-05 NOTE — Progress Notes (Signed)
HPI: Alan Soto. Alan Soto is a 33 y.o. male who  has a past medical history of Asthma (02/27/1992), Deviated septum (01/05/2016), Esophageal reflux (06/22/2015), Essential hypertension (06/22/2015), History of ADHD (07/21/2015), History of cholecystectomy (06/22/2015), Hypertension, Labral tear of shoulder (07/21/2015), Mild sleep apnea (07/21/2015), S/P vasectomy (06/22/2015), Seasonal allergies (06/22/2015), and Testosterone deficiency (06/22/2015).  he presents to [facility] today, 08/05/18,  for chief complaint of:  ADHD follow-up  Doing well on current dose of Adderall, requests 90-day refill of this.  No chest pain, pressure, insomnia, unintentional weight loss.  Patient has been increasing exercise/running and has observed intentional weight loss over the past couple of months.  Reporting home blood pressures systolic  typically in the mid to high 242P, diastolic in the 53I.   BP Readings from Last 3 Encounters:  08/05/18 (!) 142/71  02/04/18 (!) 142/81  01/02/18 130/82        At today's visit 08/05/18 ... PMH, PSH, FH reviewed and updated as needed.  Current medication list and allergy/intolerance hx reviewed and updated as needed. (See remainder of HPI, ROS, Phys Exam below)          ASSESSMENT/PLAN: The primary encounter diagnosis was Adult attention deficit disorder. A diagnosis of Essential hypertension was also pertinent to this visit.     Meds ordered this encounter  Medications  . Amphet-Dextroamphet 3-Bead ER 25 MG CP24    Sig: Take 25 mg by mouth every morning.    Dispense:  90 capsule    Refill:  0  . chlorthalidone (HYGROTON) 25 MG tablet    Sig: Take 1 tablet (25 mg total) by mouth daily.    Dispense:  90 tablet    Refill:  3       Follow-up plan: Return in about 3 months (around 11/05/2018) for annual w/ labs, see me sooner if needed!  .                                                 ################################################# ################################################# ################################################# #################################################    Current Meds  Medication Sig  . azelastine (ASTELIN) 0.1 % nasal spray Place 2 sprays into both nostrils 2 (two) times daily.  . chlorthalidone (HYGROTON) 25 MG tablet Take 1 tablet (25 mg total) by mouth daily.  . famotidine (PEPCID) 10 MG tablet Take 10 mg by mouth 2 (two) times daily.  . fluticasone-salmeterol (ADVAIR HFA) 115-21 MCG/ACT inhaler Inhale 2 puffs into the lungs 2 (two) times daily.  Marland Kitchen ketoconazole (NIZORAL) 2 % shampoo Apply 1 application topically 2 (two) times a week.  Marland Kitchen PROAIR HFA 108 (90 Base) MCG/ACT inhaler INHALE 1 TO 2 PUFFS INTO THE LUNGS EVERY 4 HOURS AS NEEDED FOR WHEEZING OR SHORNTESS OF BREATH.  . promethazine (PHENERGAN) 25 MG tablet Take 1 tablet (25 mg total) by mouth every 6 (six) hours as needed for nausea or vomiting (with migraine).  . ranitidine (ZANTAC) 150 MG tablet Take 150 mg by mouth 2 (two) times daily.  Marland Kitchen Spacer/Aero-Holding Chambers (POCKET SPACER) DEVI Use as directed w/ inhaler  . SUMAtriptan (IMITREX) 50 MG tablet Take 1 tablet by mouth every 2 hours as needed for migraine. Repeat in 2 hours if migraine persists. Max of 3 tablets in 24 hours.  . [DISCONTINUED] chlorthalidone (HYGROTON) 25 MG tablet TAKE ONE TABLET BY MOUTH DAILY  Allergies  Allergen Reactions  . Penicillins Shortness Of Breath and Anaphylaxis       Review of Systems:  Constitutional: No recent illness  HEENT: No  headache, no vision change  Cardiac: No  chest pain, No  pressure, No palpitations  Respiratory:  No  shortness of breath. No  Cough  Neurologic: No  weakness, No  Dizziness  Psychiatric: No  concerns with depression, No  concerns with anxiety  Exam:   BP (!) 142/71 (BP Location: Left Arm, Patient Position: Sitting, Cuff Size: Normal)   Pulse 69   Temp 98.2 F (36.8 C) (Oral)   Wt 220 lb 3.2 oz (99.9 kg)   BMI 29.05 kg/m   Constitutional: VS see above. General Appearance: alert, well-developed, well-nourished, NAD  Eyes: Normal lids and conjunctive, non-icteric sclera  Neck: No masses, trachea midline.   Respiratory: Normal respiratory effort. no wheeze, no rhonchi, no rales  Cardiovascular: S1/S2 normal, no murmur, no rub/gallop auscultated. RRR.   Musculoskeletal: Gait normal. Symmetric and independent movement of all extremities  Neurological: Normal balance/coordination. No tremor.  Skin: warm, dry, intact.   Psychiatric: Normal judgment/insight. Normal mood and affect. Oriented x3.       Visit summary with medication list and pertinent instructions was printed for patient to review, patient was advised to alert [place] if any updates are needed. All questions at time of visit were answered - patient instructed to contact office with any additional concerns. ER/RTC precautions were reviewed with the patient and understanding verbalized.   Note: Total time spent 15 minutes, greater than 50% of the visit was spent face-to-face counseling and coordinating care for the following: The primary encounter diagnosis was Adult attention deficit disorder. A diagnosis of Essential hypertension was also pertinent to this visit.Marland Kitchen  Please note: voice recognition software was used to produce this document, and typos may escape review. Please contact Dr. Sheppard Coil for any needed clarifications.    Follow up plan: Return in about 3 months (around 11/05/2018) for annual w/ labs, see me sooner if needed! Marland Kitchen

## 2018-08-28 DIAGNOSIS — J3489 Other specified disorders of nose and nasal sinuses: Secondary | ICD-10-CM | POA: Insufficient documentation

## 2018-08-28 DIAGNOSIS — J322 Chronic ethmoidal sinusitis: Secondary | ICD-10-CM | POA: Insufficient documentation

## 2018-09-09 ENCOUNTER — Encounter (INDEPENDENT_AMBULATORY_CARE_PROVIDER_SITE_OTHER): Payer: Managed Care, Other (non HMO) | Admitting: Osteopathic Medicine

## 2018-09-09 DIAGNOSIS — Z0289 Encounter for other administrative examinations: Secondary | ICD-10-CM | POA: Diagnosis not present

## 2018-09-10 NOTE — Telephone Encounter (Signed)
Time spent 5 mins

## 2018-10-21 ENCOUNTER — Encounter: Payer: Self-pay | Admitting: Osteopathic Medicine

## 2018-10-21 MED ORDER — AMPHET-DEXTROAMPHET 3-BEAD ER 25 MG PO CP24: 25.0000 mg | ORAL_CAPSULE | ORAL | 0 refills | Status: DC

## 2018-11-03 ENCOUNTER — Encounter: Payer: Self-pay | Admitting: Osteopathic Medicine

## 2018-11-04 NOTE — Telephone Encounter (Signed)
Called [facility] and spoke with pharmacist Fara Olden at [name] who RX sent on 10/21/18.  RX pended for [facility] in [place].

## 2018-11-05 MED ORDER — AMPHET-DEXTROAMPHET 3-BEAD ER 25 MG PO CP24: 25.0000 mg | ORAL_CAPSULE | ORAL | 0 refills | Status: DC

## 2018-11-06 ENCOUNTER — Encounter: Payer: Self-pay | Admitting: Osteopathic Medicine

## 2018-11-07 MED ORDER — AMPHETAMINE-DEXTROAMPHET ER 25 MG PO CP24: 25.0000 mg | ORAL_CAPSULE | ORAL | 0 refills | Status: DC

## 2019-01-26 ENCOUNTER — Other Ambulatory Visit: Payer: Self-pay | Admitting: Osteopathic Medicine

## 2019-01-26 ENCOUNTER — Encounter: Payer: Self-pay | Admitting: Osteopathic Medicine

## 2019-01-26 DIAGNOSIS — I10 Essential (primary) hypertension: Secondary | ICD-10-CM

## 2019-01-26 NOTE — Telephone Encounter (Signed)
Ok to refill 

## 2019-01-26 NOTE — Telephone Encounter (Signed)
Has appointment on December 14 @ 8:10am

## 2019-01-26 NOTE — Telephone Encounter (Signed)
Patient has a viritual follow up with PCP on 02/09/19 and needed medications listed below refilled up until scheduled appt date. Please Advise.   amphetamine-dextroamphetamine (ADDERALL XR) 25 MG 24 hr capsule  PROAIR HFA 108 (90 Base) MCG/ACT inhaler  chlorthalidone (HYGROTON) 25 MG tablet

## 2019-01-28 MED ORDER — AMPHETAMINE-DEXTROAMPHET ER 25 MG PO CP24: 25.0000 mg | ORAL_CAPSULE | ORAL | 0 refills | Status: DC

## 2019-01-28 MED ORDER — CHLORTHALIDONE 25 MG PO TABS: 25.0000 mg | ORAL_TABLET | Freq: Every day | ORAL | 0 refills | Status: DC

## 2019-01-28 MED ORDER — PROAIR HFA 108 (90 BASE) MCG/ACT IN AERS: 1.0000 | INHALATION_SPRAY | Freq: Four times a day (QID) | RESPIRATORY_TRACT | 0 refills | Status: DC | PRN

## 2019-01-28 NOTE — Addendum Note (Signed)
Addended by: Maryla Morrow on: 01/28/2019 10:07 AM   Modules accepted: Orders

## 2019-01-28 NOTE — Telephone Encounter (Signed)
Refilled medications

## 2019-02-09 ENCOUNTER — Ambulatory Visit: Payer: Managed Care, Other (non HMO) | Admitting: Osteopathic Medicine

## 2019-02-09 NOTE — Progress Notes (Signed)
Called pt at 815 am, no answer. Left a vm msg.

## 2019-02-27 ENCOUNTER — Other Ambulatory Visit: Payer: Self-pay | Admitting: Osteopathic Medicine

## 2019-02-27 NOTE — Telephone Encounter (Signed)
Forwarding medication refill request to the clinical pool for review. 

## 2019-03-03 NOTE — Telephone Encounter (Signed)
Must make appointment 

## 2019-04-23 ENCOUNTER — Other Ambulatory Visit: Payer: Self-pay | Admitting: Osteopathic Medicine

## 2019-04-23 DIAGNOSIS — I10 Essential (primary) hypertension: Secondary | ICD-10-CM

## 2019-07-30 ENCOUNTER — Ambulatory Visit: Payer: Managed Care, Other (non HMO) | Admitting: Medical-Surgical

## 2019-07-31 ENCOUNTER — Encounter: Payer: Self-pay | Admitting: Medical-Surgical

## 2019-07-31 ENCOUNTER — Other Ambulatory Visit: Payer: Self-pay

## 2019-07-31 ENCOUNTER — Ambulatory Visit (INDEPENDENT_AMBULATORY_CARE_PROVIDER_SITE_OTHER): Payer: BC Managed Care – PPO | Admitting: Medical-Surgical

## 2019-07-31 VITALS — BP 145/90 | HR 61 | Temp 98.4°F | Ht 73.0 in | Wt 225.5 lb

## 2019-07-31 DIAGNOSIS — J4521 Mild intermittent asthma with (acute) exacerbation: Secondary | ICD-10-CM

## 2019-07-31 DIAGNOSIS — J302 Other seasonal allergic rhinitis: Secondary | ICD-10-CM | POA: Diagnosis not present

## 2019-07-31 DIAGNOSIS — F988 Other specified behavioral and emotional disorders with onset usually occurring in childhood and adolescence: Secondary | ICD-10-CM | POA: Diagnosis not present

## 2019-07-31 DIAGNOSIS — Z114 Encounter for screening for human immunodeficiency virus [HIV]: Secondary | ICD-10-CM

## 2019-07-31 MED ORDER — PROAIR HFA 108 (90 BASE) MCG/ACT IN AERS: 1.0000 | INHALATION_SPRAY | Freq: Four times a day (QID) | RESPIRATORY_TRACT | 0 refills | Status: DC | PRN

## 2019-07-31 MED ORDER — AMPHETAMINE-DEXTROAMPHET ER 25 MG PO CP24: 25.0000 mg | ORAL_CAPSULE | ORAL | 0 refills | Status: DC

## 2019-07-31 MED ORDER — PROAIR HFA 108 (90 BASE) MCG/ACT IN AERS: 1.0000 | INHALATION_SPRAY | Freq: Four times a day (QID) | RESPIRATORY_TRACT | 5 refills | Status: DC | PRN

## 2019-07-31 MED ORDER — AZELASTINE HCL 0.1 % NA SOLN: 2.0000 | Freq: Two times a day (BID) | NASAL | 12 refills | Status: DC

## 2019-07-31 NOTE — Progress Notes (Signed)
Subjective:    CC: ADHD follow up  HPI: Alan 34 year old Soto presenting today for ADHD follow-up.  Would also like to discuss allergic rhinitis and asthma medication refills.  Adult ADHD-patient has been taking Adderall XR 25 mg daily on weekdays.  Notes that he does not take on weekends.  Tolerating the medication well without side effects but does endorse feeling a midday crash approximately 12 noon to 2 PM.  Previously tried [name] which worked Recruitment consultant without this midday crash but this is not covered by insurance now.  Asthma-has been dealing with asthma since he was 34 years old.  Notes environmental triggers as well as wheezing/shortness of breath with strenuous exercise.  Preemptively uses albuterol prior to exercise with good results.  On average uses albuterol once per day.  Allergic rhinitis-had sinus surgery last summer and was told by ENT to continue using Astelin nasal spray as he has terrible allergies.  Feels that the Astelin nasal spray helps with his congestion and is requesting refills.  I reviewed the past medical history, family history, social history, surgical history, and allergies today and no changes were needed.  Please see the problem list section below in epic for further details.  Past Medical History: Past Medical History:  Diagnosis Date  . Asthma 02/27/1992   As per patient  . Deviated septum 01/05/2016   Visit with ENT 01/12/2016: Turbinate reduction, septoplasty, bilateral maxillary antrostomy planned by ENT. 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  . Esophageal reflux 06/22/2015   Status post EGD 06/03/2014. Small hiatal hernia, nonspecific gastritis   . Essential hypertension 06/22/2015  . History of ADHD 07/21/2015   Records reviewed from previous PCP, patient treated with [name], Wellbutrin, previously on dexmethylphenidate (Focalin)   . History of cholecystectomy 06/22/2015   33/8250 -  complications from surgery, persistent N/V and abdominal pain, subsequent Endo/Colonoscopies fine, removal seems to have resulted in food intol/allerg   . Hypertension   . Labral tear of shoulder 07/21/2015   Status post arthroscopy   . Mild sleep apnea 07/21/2015   Status post sleep study people 21st 2016, recommendation with CPAP   . S/P vasectomy 06/22/2015  . Seasonal allergies 06/22/2015  . Testosterone deficiency 06/22/2015   Previously followed by endocrine back in [place], see where they mention hypogonadism in to stop Depo (presumably Depo-testosterone) for 2 months and then reevaluate, not sure if patient ever followed up her these records just aren't available. Labs reviewed, there was 1 mention of abnormally low testosterone but other values had been normal. Labs done by myself were normal. Consider endocrine referral if patient wants to pursue this issue further    Past Surgical History: Past Surgical History:  Procedure Laterality Date  . CHOLECYSTECTOMY  01/2015  . SHOULDER SURGERY  11/2014   Social History: Social History   Socioeconomic History  . Marital status: Married    Spouse name: Not on file  . Number of children: Not on file  . Years of education: Not on file  . Highest education level: Not on file  Occupational History  . Not on file  Tobacco Use  . Smoking status: Never Smoker  . Smokeless tobacco: Never Used  Substance and Sexual Activity  . Alcohol use: Not on file  . Drug use: Not on file  . Sexual activity: Not on file  Other Topics Concern  . Not on file  Social History Narrative  . Not on file   Social Determinants of Health  Financial Resource Strain:   . Difficulty of Paying Living Expenses:   Food Insecurity:   . Worried About Charity fundraiser in the Last Year:   . Arboriculturist in the Last Year:   Transportation Needs:   . Film/video editor (Medical):   Marland Kitchen Lack of Transportation (Non-Medical):   Physical Activity:   . Days of  Exercise per Week:   . Minutes of Exercise per Session:   Stress:   . Feeling of Stress :   Social Connections:   . Frequency of Communication with Friends and Family:   . Frequency of Social Gatherings with Friends and Family:   . Attends Religious Services:   . Active Member of Clubs or Organizations:   . Attends Archivist Meetings:   Marland Kitchen Marital Status:    Family History: History reviewed. No pertinent family history. Allergies: Allergies  Allergen Reactions  . Penicillins Shortness Of Breath and Anaphylaxis   Medications: See med rec.  Review of Systems: See HPI for pertinent positives and negatives.   Objective:    General: Well Developed, well nourished, and in no acute distress.  Neuro: Alert and oriented x3.  HEENT: Normocephalic, atraumatic.  Skin: Warm and dry. Cardiac: Regular rate and rhythm, no murmurs rubs or gallops, no lower extremity edema.  Respiratory: Clear to auscultation bilaterally. Not using accessory muscles, speaking in full sentences.   Impression and Recommendations:    1. Adult attention deficit disorder Continue Adderall XR 25 daily.  Discussed potentially adding in an instant release afternoon dose but patient would like to defer that for now. - amphetamine-dextroamphetamine (ADDERALL XR) 25 MG 24 hr capsule; Take 1 capsule by mouth every morning.  Dispense: 30 capsule; Refill: 0 - amphetamine-dextroamphetamine (ADDERALL XR) 25 MG 24 hr capsule; Take 1 capsule by mouth every morning.  Dispense: 30 capsule; Refill: 0 - amphetamine-dextroamphetamine (ADDERALL XR) 25 MG 24 hr capsule; Take 1 capsule by mouth every morning.  Dispense: 30 capsule; Refill: 0  2. Screening for human immunodeficiency virus Discussed HIV screening recommendations.  Patient amenable and we will add this to his next lab work. - HIV Antibody (routine testing w rflx)  3. Seasonal allergies Continue azelastine nasal spray twice daily.  Refills provided. -  azelastine (ASTELIN) 0.1 % nasal spray; Place 2 sprays into both nostrils 2 (two) times daily.  Dispense: 30 mL; Refill: 12  4. Mild intermittent asthma with exacerbation Continue albuterol as needed.  Refills provided. - PROAIR HFA 108 (90 Base) MCG/ACT inhaler; Inhale 1-2 puffs into the lungs every 6 (six) hours as needed for wheezing or shortness of breath. MUST MAKE APPOINTMENT  Dispense: 18 g; Refill: 0  Return in about 3 months (around 10/31/2019) for ADD follow up.  Due for annual physical exam with lab work. ___________________________________________ Clearnce Sorrel, DNP, APRN, FNP-BC [facility]

## 2019-10-26 ENCOUNTER — Other Ambulatory Visit: Payer: Self-pay | Admitting: Medical-Surgical

## 2019-10-26 DIAGNOSIS — F988 Other specified behavioral and emotional disorders with onset usually occurring in childhood and adolescence: Secondary | ICD-10-CM

## 2019-11-09 ENCOUNTER — Encounter: Payer: Self-pay | Admitting: Osteopathic Medicine

## 2019-11-09 DIAGNOSIS — F988 Other specified behavioral and emotional disorders with onset usually occurring in childhood and adolescence: Secondary | ICD-10-CM

## 2019-11-09 NOTE — Telephone Encounter (Signed)
Last refill -07/31/2019 Last Ov- 07/31/2019

## 2019-11-10 ENCOUNTER — Other Ambulatory Visit: Payer: Self-pay

## 2019-11-10 DIAGNOSIS — F988 Other specified behavioral and emotional disorders with onset usually occurring in childhood and adolescence: Secondary | ICD-10-CM

## 2019-11-10 NOTE — Telephone Encounter (Signed)
Last ov-07/31/2019 Last refill- 09/29/2019

## 2019-11-11 MED ORDER — AMPHETAMINE-DEXTROAMPHET ER 25 MG PO CP24: 25.0000 mg | ORAL_CAPSULE | ORAL | 0 refills | Status: DC

## 2019-11-13 MED ORDER — AMPHETAMINE-DEXTROAMPHET ER 25 MG PO CP24: 25.0000 mg | ORAL_CAPSULE | ORAL | 0 refills | Status: DC

## 2019-12-10 NOTE — Telephone Encounter (Signed)
This encounter was created in error - please disregard.

## 2019-12-15 ENCOUNTER — Telehealth: Payer: Self-pay | Admitting: Osteopathic Medicine

## 2019-12-15 DIAGNOSIS — F988 Other specified behavioral and emotional disorders with onset usually occurring in childhood and adolescence: Secondary | ICD-10-CM

## 2019-12-15 MED ORDER — AMPHETAMINE-DEXTROAMPHET ER 25 MG PO CP24: 25.0000 mg | ORAL_CAPSULE | ORAL | 0 refills | Status: DC

## 2019-12-15 NOTE — Telephone Encounter (Addendum)
Please call patient 30 days refill sent OVERDUE FOR ANNUAL / FOLLOW-UP  Please remind him [employer] will NOT pay for annual + any other problem-based visit, if he has other issues to go over will need 2 appointments

## 2019-12-15 NOTE — Telephone Encounter (Signed)
Emrick, Your request has been denied by provider because you are due for an appointment. This can be in the office or by video. If you need assistance getting this schedule please let us know.

## 2019-12-17 NOTE — Telephone Encounter (Signed)
Please see Dr Redgie Grayer note and get patient scheduled

## 2019-12-17 NOTE — Telephone Encounter (Signed)
Appointment has been made. No further questions at this time.  

## 2020-01-13 ENCOUNTER — Other Ambulatory Visit: Payer: Self-pay

## 2020-01-13 ENCOUNTER — Encounter: Payer: Self-pay | Admitting: Osteopathic Medicine

## 2020-01-13 ENCOUNTER — Ambulatory Visit (INDEPENDENT_AMBULATORY_CARE_PROVIDER_SITE_OTHER): Payer: BC Managed Care – PPO | Admitting: Osteopathic Medicine

## 2020-01-13 VITALS — BP 172/83 | HR 67 | Temp 98.5°F | Wt 223.0 lb

## 2020-01-13 DIAGNOSIS — Z0000 Encounter for general adult medical examination without abnormal findings: Secondary | ICD-10-CM

## 2020-01-13 DIAGNOSIS — I10 Essential (primary) hypertension: Secondary | ICD-10-CM | POA: Diagnosis not present

## 2020-01-13 DIAGNOSIS — F988 Other specified behavioral and emotional disorders with onset usually occurring in childhood and adolescence: Secondary | ICD-10-CM

## 2020-01-13 MED ORDER — FAMOTIDINE 10 MG PO TABS: 10.0000 mg | ORAL_TABLET | Freq: Two times a day (BID) | ORAL | 3 refills | Status: AC

## 2020-01-13 MED ORDER — AMPHETAMINE-DEXTROAMPHET ER 25 MG PO CP24: 25.0000 mg | ORAL_CAPSULE | ORAL | 0 refills | Status: DC

## 2020-01-13 MED ORDER — CHLORTHALIDONE 25 MG PO TABS: 25.0000 mg | ORAL_TABLET | Freq: Every day | ORAL | 0 refills | Status: DC

## 2020-01-13 NOTE — Patient Instructions (Addendum)
General Preventive Care  Most recent routine screening labs: ordered.   Blood pressure goal 130/80 or less.   Tobacco: don't!   Alcohol: responsible moderation is ok for most adults - if you have concerns about your alcohol intake, please talk to me!   Exercise: as tolerated to reduce risk of cardiovascular disease and diabetes. Strength training will also prevent osteoporosis.   Mental health: if need for mental health care (medicines, counseling, other), or concerns about moods, please let me know!   Sexual / Reproductive health: if need for STD testing, or if concerns with libido/pain problems, please let me know!   Advanced Directive: Living Will and/or Healthcare Power of Attorney recommended for all adults, regardless of age or health.  Vaccines  Flu vaccine: for almost everyone, every fall.   Shingles vaccine: after age 74.   Pneumonia vaccines: after age 77, or sooner if certain medical conditions.  Tetanus booster: every 10 years  COVID vaccine: THANKS for getting your vaccine! :)  Cancer screenings   Colon cancer screening: for everyone age 63-75.   Prostate cancer screening: PSA blood test age 6-71  Lung cancer screening: CT chest every year for those aged 20 to 34 years who have a 20 pack-year smoking history and currently smoke or have quit within the past 15 years  Infection screenings   HIV: recommended screening at least once age 79-65, more often as needed.  Gonorrhea/Chlamydia: screening as needed  Hepatitis C: recommended once for everyone age 21-11  TB: certain at-risk populations, or depending on work requirements and/or travel history Other  Bone Density Test: recommended men at age 64, sooner depending on risk factors  Abdominal Aortic Aneurysm: screening with ultrasound recommended once for men age 49-75 who have ever smoked

## 2020-01-13 NOTE — Progress Notes (Signed)
HPI: Alan Soto. [place] is a 34 y.o. male who  has a past medical history of Asthma (02/27/1992), Deviated septum (01/05/2016), Esophageal reflux (06/22/2015), Essential hypertension (06/22/2015), History of ADHD (07/21/2015), History of cholecystectomy (06/22/2015), Hypertension, Labral tear of shoulder (07/21/2015), Mild sleep apnea (07/21/2015), S/P vasectomy (06/22/2015), Seasonal allergies (06/22/2015), and Testosterone deficiency (06/22/2015).  he presents to [facility] today, 01/13/20,  for chief complaint of:  Routine physical  ADHD Rx HTN     BP Readings from Last 3 Encounters:  01/13/20 (!) 172/83  07/31/19 (!) 145/90  08/05/18 (!) 142/71       ASSESSMENT/PLAN: The primary encounter diagnosis was Annual physical exam. Diagnoses of Adult attention deficit disorder, Essential hypertension, and Essential (primary) hypertension were also pertinent to this visit.  1. Annual physical exam See below   2. Adult attention deficit disorder Caution w/ stimulants given BP see below  3. Essential hypertension Has been off chlorthalidone No CP/SOB Cannot recall imaging for w/u RAS/2HTN and I don't see any on record, has been on antihypertensives since hsi 20s. Labs/imaging as below   Orders Placed This Encounter  Procedures  . CT ANGIO ABDOMEN W &/OR WO CONTRAST  . CBC  . COMPLETE METABOLIC PANEL WITH GFR  . Lipid panel  . TSH  . Aldosterone + renin activity w/ ratio     Meds ordered this encounter  Medications  . chlorthalidone (HYGROTON) 25 MG tablet    Sig: Take 1 tablet (25 mg total) by mouth daily.    Dispense:  90 tablet    Refill:  0    No refills. Needs f/u appt w/PCP.  . famotidine (PEPCID) 10 MG tablet    Sig: Take 1 tablet (10 mg total) by mouth 2 (two) times daily.    Dispense:  90 tablet    Refill:  3  . amphetamine-dextroamphetamine (ADDERALL XR) 25 MG 24 hr capsule    Sig: Take 1 capsule by mouth every morning. If unable to  disp #90 at a time, can disp #30 today w/ 0 refills; disp #30 30 days from today w/ 0 refills; disp #30 in 60 days w/ 0 refills.    Dispense:  90 capsule    Refill:  0    Patient Instructions  General Preventive Care  Most recent routine screening labs: ordered.   Blood pressure goal 130/80 or less.   Tobacco: don't!   Alcohol: responsible moderation is ok for most adults - if you have concerns about your alcohol intake, please talk to me!   Exercise: as tolerated to reduce risk of cardiovascular disease and diabetes. Strength training will also prevent osteoporosis.   Mental health: if need for mental health care (medicines, counseling, other), or concerns about moods, please let me know!   Sexual / Reproductive health: if need for STD testing, or if concerns with libido/pain problems, please let me know!   Advanced Directive: Living Will and/or Healthcare Power of Attorney recommended for all adults, regardless of age or health.  Vaccines  Flu vaccine: for almost everyone, every fall.   Shingles vaccine: after age 11.   Pneumonia vaccines: after age 57, or sooner if certain medical conditions.  Tetanus booster: every 10 years  COVID vaccine: THANKS for getting your vaccine! :)  Cancer screenings   Colon cancer screening: for everyone age 50-75.   Prostate cancer screening: PSA blood test age 93-71  Lung cancer screening: CT chest every year for those aged 35 to 59 years  who have a 20 pack-year smoking history and currently smoke or have quit within the past 15 years  Infection screenings  . HIV: recommended screening at least once age 6-65, more often as needed. . Gonorrhea/Chlamydia: screening as needed . Hepatitis C: recommended once for everyone age 74-75 . TB: certain at-risk populations, or depending on work requirements and/or travel history Other . Bone Density Test: recommended men at age 34, sooner depending on risk factors . Abdominal Aortic Aneurysm:  screening with ultrasound recommended once for men age 19-75 who have ever smoked      Follow-up plan: Return in about 1 week (around 01/20/2020) for NURSE VISIT BP CHECK .                                                 ################################################# ################################################# ################################################# #################################################    Current Meds  Medication Sig  . amphetamine-dextroamphetamine (ADDERALL XR) 25 MG 24 hr capsule Take 1 capsule by mouth every morning. If unable to disp #90 at a time, can disp #30 today w/ 0 refills; disp #30 30 days from today w/ 0 refills; disp #30 in 60 days w/ 0 refills.  Marland Kitchen azelastine (ASTELIN) 0.1 % nasal spray Place 2 sprays into both nostrils 2 (two) times daily.  . famotidine (PEPCID) 10 MG tablet Take 1 tablet (10 mg total) by mouth 2 (two) times daily.  . Ferrous Sulfate (IRON PO) Take 1 tablet by mouth daily.  Marland Kitchen PROAIR HFA 108 (90 Base) MCG/ACT inhaler Inhale 1-2 puffs into the lungs every 6 (six) hours as needed for wheezing or shortness of breath.  . [DISCONTINUED] amphetamine-dextroamphetamine (ADDERALL XR) 25 MG 24 hr capsule Take 1 capsule by mouth every morning.  . [DISCONTINUED] famotidine (PEPCID) 10 MG tablet Take 10 mg by mouth 2 (two) times daily.    Allergies  Allergen Reactions  . Penicillins Shortness Of Breath and Anaphylaxis       Review of Systems: Pertinent (+) and (-) ROS in HPI as above   Exam:  BP (!) 172/83 (BP Location: Right Arm, Patient Position: Sitting, Cuff Size: Large)   Pulse 67   Temp 98.5 F (36.9 C) (Oral)   Wt 223 lb 0.6 oz (101.2 kg)   BMI 29.43 kg/m   Constitutional: VS see above. General Appearance: alert, well-developed, well-nourished, NAD  Neck: No masses, trachea midline.   Respiratory: Normal respiratory effort. no wheeze, no rhonchi, no  rales  Cardiovascular: S1/S2 normal, no murmur, no rub/gallop auscultated. RRR.   Musculoskeletal: Gait normal. Symmetric and independent movement of all extremities  Abdominal: non-tender, non-distended, no appreciable organomegaly, neg Murphy's, BS WNLx4  Neurological: Normal balance/coordination. No tremor.  Skin: warm, dry, intact.   Psychiatric: Normal judgment/insight. Normal mood and affect. Oriented x3.       Visit summary with medication list and pertinent instructions was printed for patient to review, patient was advised to alert [place] if any updates are needed. All questions at time of visit were answered - patient instructed to contact office with any additional concerns. ER/RTC precautions were reviewed with the patient and understanding verbalized.      Please note: voice recognition software was used to produce this document, and typos may escape review. Please contact Dr. Sheppard Coil for any needed clarifications.    Follow up plan: Return in about 1 week (around 01/20/2020) for NURSE  VISIT BP CHECK .

## 2020-01-22 LAB — CBC
HCT: 44 % (ref 38.5–50.0)
Hemoglobin: 14 g/dL (ref 13.2–17.1)
MCH: 25.5 pg — ABNORMAL LOW (ref 27.0–33.0)
MCHC: 31.8 g/dL — ABNORMAL LOW (ref 32.0–36.0)
MCV: 80.3 fL (ref 80.0–100.0)
MPV: 9.7 fL (ref 7.5–12.5)
Platelets: 368 10*3/uL (ref 140–400)
RBC: 5.48 10*6/uL (ref 4.20–5.80)
RDW: 12.9 % (ref 11.0–15.0)
WBC: 6.4 10*3/uL (ref 3.8–10.8)

## 2020-01-22 LAB — LIPID PANEL
Cholesterol: 225 mg/dL — ABNORMAL HIGH (ref <200)
HDL: 54 mg/dL (ref >=40)
LDL Cholesterol (Calc): 147 mg/dL (calc) — ABNORMAL HIGH
Non-HDL Cholesterol (Calc): 171 mg/dL (calc) — ABNORMAL HIGH (ref <130)
Total CHOL/HDL Ratio: 4.2 (calc) (ref <5.0)
Triglycerides: 120 mg/dL (ref <150)

## 2020-01-22 LAB — COMPLETE METABOLIC PANEL WITH GFR
AG Ratio: 1.7 (calc) (ref 1.0–2.5)
ALT: 18 U/L (ref 9–46)
AST: 20 U/L (ref 10–40)
Albumin: 4.5 g/dL (ref 3.6–5.1)
Alkaline phosphatase (APISO): 48 U/L (ref 36–130)
BUN: 15 mg/dL (ref 7–25)
CO2: 26 mmol/L (ref 20–32)
Calcium: 9.9 mg/dL (ref 8.6–10.3)
Chloride: 105 mmol/L (ref 98–110)
Creat: 0.73 mg/dL (ref 0.60–1.35)
GFR, Est African American: 139 mL/min/{1.73_m2} (ref >=60)
GFR, Est Non African American: 120 mL/min/{1.73_m2} (ref >=60)
Globulin: 2.7 g/dL (calc) (ref 1.9–3.7)
Glucose, Bld: 82 mg/dL (ref 65–99)
Potassium: 4.7 mmol/L (ref 3.5–5.3)
Sodium: 140 mmol/L (ref 135–146)
Total Bilirubin: 0.3 mg/dL (ref 0.2–1.2)
Total Protein: 7.2 g/dL (ref 6.1–8.1)

## 2020-01-22 LAB — TSH: TSH: 1.32 mIU/L (ref 0.40–4.50)

## 2020-01-22 LAB — ALDOSTERONE + RENIN ACTIVITY W/ RATIO
ALDO / PRA Ratio: 4.3 Ratio (ref 0.9–28.9)
Aldosterone: 2 ng/dL
Renin Activity: 0.47 ng/mL/h (ref 0.25–5.82)

## 2020-02-12 ENCOUNTER — Other Ambulatory Visit: Payer: Self-pay

## 2020-02-12 ENCOUNTER — Encounter: Payer: Self-pay | Admitting: Osteopathic Medicine

## 2020-02-12 DIAGNOSIS — F988 Other specified behavioral and emotional disorders with onset usually occurring in childhood and adolescence: Secondary | ICD-10-CM

## 2020-02-12 NOTE — Telephone Encounter (Signed)
Ashley requesting med refill for adderall. Rx pended.

## 2020-02-12 NOTE — Telephone Encounter (Signed)
Pls review telephone encounter noting pharmacy's request.

## 2020-02-15 MED ORDER — AMPHETAMINE-DEXTROAMPHET ER 25 MG PO CP24: 25.0000 mg | ORAL_CAPSULE | ORAL | 0 refills | Status: DC

## 2020-04-12 ENCOUNTER — Encounter: Payer: Self-pay | Admitting: Osteopathic Medicine

## 2020-04-12 NOTE — Telephone Encounter (Signed)
I scheduled pt for 1:00 tomorrow.

## 2020-04-13 ENCOUNTER — Telehealth (INDEPENDENT_AMBULATORY_CARE_PROVIDER_SITE_OTHER): Payer: BC Managed Care – PPO | Admitting: Osteopathic Medicine

## 2020-04-13 ENCOUNTER — Encounter: Payer: Self-pay | Admitting: Osteopathic Medicine

## 2020-04-13 DIAGNOSIS — F988 Other specified behavioral and emotional disorders with onset usually occurring in childhood and adolescence: Secondary | ICD-10-CM | POA: Diagnosis not present

## 2020-04-13 MED ORDER — AMPHETAMINE-DEXTROAMPHET ER 25 MG PO CP24
25.0000 mg | ORAL_CAPSULE | ORAL | 0 refills | Status: DC
Start: 2020-04-13 — End: 2020-05-13

## 2020-04-13 MED ORDER — LISDEXAMFETAMINE DIMESYLATE 40 MG PO CAPS: 40.0000 mg | ORAL_CAPSULE | ORAL | 0 refills | Status: DC

## 2020-04-13 MED ORDER — AMPHETAMINE-DEXTROAMPHET ER 25 MG PO CP24: 25.0000 mg | ORAL_CAPSULE | ORAL | 0 refills | Status: DC

## 2020-04-13 NOTE — Progress Notes (Signed)
Telemedicine Visit via  Video & Audio (App used: MyChart)   I connected with [name] on 04/13/20 at 1:05 PM  by phone or  telemedicine application as noted above  I verified that I am speaking with or regarding  the correct patient using two identifiers.  Participants: Myself, Dr Emeterio Reeve DO Patient: Alan Soto. [place] Patient proxy if applicable: none Other, if applicable: none  Patient is at home I am in office at [facility]    I discussed the limitations of evaluation and management  by telemedicine and the availability of in person appointments.  The participant(s) above expressed understanding and  agreed to proceed with this appointment via telemedicine.       History of Present Illness: Alan Soto is a 35 y.o. male who would like to discuss changing ADHD Rx has been on Adderall for a few years, seems to be wearing off, he is feeling more tired mid-day, "crashing" would like ot try getting back on [name] if possible but open to alternative   Observations/Objective: Temp (!) 97.4 F (36.3 C) (Oral)  BP Readings from Last 3 Encounters:  01/13/20 (!) 172/83  07/31/19 (!) 145/90  08/05/18 (!) 142/71   Exam: Normal Speech.  NAD  Lab and Radiology Results No results found for this or any previous visit (from the past 72 hour(s)). No results found.     Assessment and Plan: 35 y.o. male with The encounter diagnosis was Adult attention deficit disorder.   PDMP reviewed during this encounter. No orders of the defined types were placed in this encounter.  Meds ordered this encounter  Medications  . DISCONTD: lisdexamfetamine ([name]) 40 MG capsule    Sig: Take 1 capsule (40 mg total) by mouth every morning.    Dispense:  30 capsule    Refill:  0    To fill if [name] prior auth needed  . DISCONTD: amphetamine-dextroamphetamine (ADDERALL XR) 25 MG 24 hr capsule    Sig: Take 1 capsule by mouth every morning.    Dispense:   30 capsule    Refill:  0  . amphetamine-dextroamphetamine (ADDERALL XR) 25 MG 24 hr capsule    Sig: Take 1 capsule by mouth every morning.    Dispense:  30 capsule    Refill:  0    To fill if [name] PA needed - CANCEL LAST RX SENT TODAY IN ERROR  . lisdexamfetamine ([name]) 40 MG capsule    Sig: Take 1 capsule (40 mg total) by mouth every morning.    Dispense:  30 capsule    Refill:  0    If PA needed: on [name] in the past which controlled ADHD symptoms well. Adderall XR ineffective - CANCEL LAST RX SENT TODAY IN ERROR   There are no Patient Instructions on file for this visit.  Instructions sent via MyChart.   Follow Up Instructions: No follow-ups on file.    I discussed the assessment and treatment plan with the patient. The patient was provided an opportunity to ask questions and all were answered. The patient agreed with the plan and demonstrated an understanding of the instructions.   The patient was advised to call back or seek an in-person evaluation if any new concerns, if symptoms worsen or if the condition fails to improve as anticipated.  20 minutes of non-face-to-face time was provided during this encounter.      . . . . . . . . . . . . . Marland Kitchen  Historical information moved to improve visibility of documentation.  Past Medical History:  Diagnosis Date  . Asthma 02/27/1992   As per patient  . Deviated septum 01/05/2016   Visit with ENT 01/12/2016: Turbinate reduction, septoplasty, bilateral maxillary antrostomy planned by ENT. 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  . Esophageal reflux 06/22/2015   Status post EGD 06/03/2014. Small hiatal hernia, nonspecific gastritis   . Essential hypertension 06/22/2015  . History of ADHD 07/21/2015   Records reviewed from previous PCP, patient treated with [name], Wellbutrin, previously on dexmethylphenidate (Focalin)   . History of  cholecystectomy 06/22/2015   59/5638 - complications from surgery, persistent N/V and abdominal pain, subsequent Endo/Colonoscopies fine, removal seems to have resulted in food intol/allerg   . Hypertension   . Labral tear of shoulder 07/21/2015   Status post arthroscopy   . Mild sleep apnea 07/21/2015   Status post sleep study people 21st 2016, recommendation with CPAP   . S/P vasectomy 06/22/2015  . Seasonal allergies 06/22/2015  . Testosterone deficiency 06/22/2015   Previously followed by endocrine back in [place], see where they mention hypogonadism in to stop Depo (presumably Depo-testosterone) for 2 months and then reevaluate, not sure if patient ever followed up her these records just aren't available. Labs reviewed, there was 1 mention of abnormally low testosterone but other values had been normal. Labs done by myself were normal. Consider endocrine referral if patient wants to pursue this issue further    Past Surgical History:  Procedure Laterality Date  . CHOLECYSTECTOMY  01/2015  . SHOULDER SURGERY  11/2014   Social History   Tobacco Use  . Smoking status: Never Smoker  . Smokeless tobacco: Never Used  Substance Use Topics  . Alcohol use: Not on file   family history is not on file.  Medications: Current Outpatient Medications  Medication Sig Dispense Refill  . azelastine (ASTELIN) 0.1 % nasal spray Place 2 sprays into both nostrils 2 (two) times daily. 30 mL 12  . chlorthalidone (HYGROTON) 25 MG tablet Take 1 tablet (25 mg total) by mouth daily. 90 tablet 0  . famotidine (PEPCID) 10 MG tablet Take 1 tablet (10 mg total) by mouth 2 (two) times daily. 90 tablet 3  . Ferrous Sulfate (IRON PO) Take 1 tablet by mouth daily.    Marland Kitchen PROAIR HFA 108 (90 Base) MCG/ACT inhaler Inhale 1-2 puffs into the lungs every 6 (six) hours as needed for wheezing or shortness of breath. 18 g 5  . amphetamine-dextroamphetamine (ADDERALL XR) 25 MG 24 hr capsule Take 1 capsule by mouth every morning. 30  capsule 0  . lisdexamfetamine ([name]) 40 MG capsule Take 1 capsule (40 mg total) by mouth every morning. 30 capsule 0   No current facility-administered medications for this visit.   Allergies  Allergen Reactions  . Penicillins Shortness Of Breath and Anaphylaxis     If phone visit, billing and coding can please add appropriate modifier if needed

## 2020-04-13 NOTE — Progress Notes (Signed)
Pt states he has been having low energy recently.

## 2020-05-12 ENCOUNTER — Encounter: Payer: Self-pay | Admitting: Osteopathic Medicine

## 2020-05-12 DIAGNOSIS — F988 Other specified behavioral and emotional disorders with onset usually occurring in childhood and adolescence: Secondary | ICD-10-CM

## 2020-05-13 MED ORDER — AMPHETAMINE-DEXTROAMPHET ER 25 MG PO CP24: 25.0000 mg | ORAL_CAPSULE | ORAL | 0 refills | Status: DC

## 2020-06-01 ENCOUNTER — Encounter: Payer: Self-pay | Admitting: Osteopathic Medicine

## 2020-06-03 MED ORDER — LISDEXAMFETAMINE DIMESYLATE 40 MG PO CAPS: 40.0000 mg | ORAL_CAPSULE | ORAL | 0 refills | Status: DC

## 2020-07-05 ENCOUNTER — Encounter: Payer: Self-pay | Admitting: Osteopathic Medicine

## 2020-07-06 MED ORDER — LISDEXAMFETAMINE DIMESYLATE 40 MG PO CAPS: 40.0000 mg | ORAL_CAPSULE | ORAL | 0 refills | Status: DC

## 2020-08-31 ENCOUNTER — Telehealth: Payer: Self-pay

## 2020-08-31 ENCOUNTER — Other Ambulatory Visit: Payer: Self-pay | Admitting: Family Medicine

## 2020-08-31 MED ORDER — LISDEXAMFETAMINE DIMESYLATE 40 MG PO CAPS: 40.0000 mg | ORAL_CAPSULE | ORAL | 0 refills | Status: DC

## 2020-08-31 NOTE — Telephone Encounter (Signed)
The patient would like his refill of [name] sent to the [facility] in [place].

## 2020-08-31 NOTE — Telephone Encounter (Signed)
Rx sent in

## 2020-09-08 ENCOUNTER — Other Ambulatory Visit: Payer: Self-pay

## 2020-09-08 ENCOUNTER — Ambulatory Visit: Payer: Self-pay

## 2020-09-08 ENCOUNTER — Ambulatory Visit: Payer: Managed Care, Other (non HMO) | Admitting: Family Medicine

## 2020-09-08 ENCOUNTER — Encounter: Payer: Self-pay | Admitting: Family Medicine

## 2020-09-08 VITALS — BP 160/88 | Ht 73.0 in | Wt 230.0 lb

## 2020-09-08 DIAGNOSIS — M25461 Effusion, right knee: Secondary | ICD-10-CM | POA: Diagnosis not present

## 2020-09-08 DIAGNOSIS — M7632 Iliotibial band syndrome, left leg: Secondary | ICD-10-CM | POA: Diagnosis not present

## 2020-09-08 DIAGNOSIS — M25561 Pain in right knee: Secondary | ICD-10-CM

## 2020-09-08 MED ORDER — MELOXICAM 7.5 MG PO TABS: 7.5000 mg | ORAL_TABLET | Freq: Two times a day (BID) | ORAL | 1 refills | Status: DC | PRN

## 2020-09-08 NOTE — Patient Instructions (Signed)
Nice to meet you Please try the mobic for 4 days straight and then as needed  Please try ice as needed  Please try the exercises   Please send me a message in [place] with any questions or updates.  Please see me back in 4 weeks.   --Dr. Raeford Razor

## 2020-09-08 NOTE — Progress Notes (Signed)
Alan Soto - 35 y.o. male MRN [id number]  Date of birth: January 29, 1986  SUBJECTIVE:  Including CC & ROS.  No chief complaint on file.   Alan Soto is a 35 y.o. male that is presenting with right and left knee pain. Right knee has been occurring about 2 months. No injury. Pain is anterior and medial and worse with flexion. Having lateral sided left knee pain with snapping.   Review of Systems See HPI   HISTORY: Past Medical, Surgical, Social, and Family History Reviewed & Updated per EMR.   Pertinent Historical Findings include:  Past Medical History:  Diagnosis Date   Asthma 02/27/1992   As per patient   Deviated septum 01/05/2016   Visit with ENT 01/12/2016: Turbinate reduction, septoplasty, bilateral maxillary antrostomy planned by ENT. 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   Esophageal reflux 06/22/2015   Status post EGD 06/03/2014. 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 Labs reviewed, there was 1 mention of abnormally low  testosterone but other values had been normal. Labs done by myself were normal. Consider endocrine referral if patient wants to pursue this issue further     Past Surgical History:  Procedure Laterality Date   CHOLECYSTECTOMY  01/2015   SHOULDER SURGERY  11/2014    History reviewed. No pertinent family history.  Social History   Socioeconomic History   Marital status: Divorced    Spouse name: Not on file   Number of children: Not on file   Years of education: Not on file   Highest education level: Not on file  Occupational History   Not on file  Tobacco Use   Smoking status: Never   Smokeless tobacco: Never  Substance and Sexual Activity   Alcohol use: Not on file   Drug use: Not on file   Sexual activity: Not on file  Other Topics Concern   Not on file  Social History Narrative   Not on file   Social Determinants of Health   Financial Resource Strain: Not on file  Food Insecurity: Not on file  Transportation Needs: Not on file  Physical Activity: Not on file  Stress: Not on file  Social Connections: Not on file  Intimate Partner Violence: Not on file     PHYSICAL EXAM:  VS: BP (!) 160/88 (BP Location: Left Arm, Patient Position: Sitting, Cuff Size: Large)   Ht 6\' 1"  (1.854 m)   Wt 230 lb (104.3 kg)   BMI 30.34 kg/m  Physical Exam Gen: NAD, alert, cooperative with exam, well-appearing MSK:  Right and left knee:  Right knee with mild effusion  Normal  ROM  Weakness with hip abduction  Snapping of biceps femoris tendon on flexion  Positive Noble's test on left  Neurovascularly intact    Limited ultrasound: Right knee:  Mild effusion of the suprapatellar pouch. Normal-appearing quadricep and patellar tendon. Normal joint space with mild degenerative change of the medial meniscus. Mild degenerative change of the lateral meniscus.  Summary: Mild effusion observed on exam  Ultrasound and interpretation by Clearance Coots, MD    ASSESSMENT & PLAN:    Knee effusion, right Pain occurring more with flexion that is likely related to hip abduction weakness.  He does have mild effusion on exam which could be related to degenerative meniscus -Counseled on home exercise therapy and supportive care. -Counseled on augmenting his training. -Mobic -Could consider physical therapy, further imaging or PRP.  It band syndrome, left Having snapping of the biceps femoris on squatting.  -Counseled on home exercise therapy and supportive care. -Mobic. -Could consider physical therapy.

## 2020-09-08 NOTE — Assessment & Plan Note (Addendum)
Pain occurring more with flexion that is likely related to hip abduction weakness.  He does have mild effusion on exam which could be related to degenerative meniscus -Counseled on home exercise therapy and supportive care. -Counseled on augmenting his training. -Mobic -Could consider physical therapy, further imaging or PRP.

## 2020-09-08 NOTE — Assessment & Plan Note (Signed)
Having snapping of the biceps femoris on squatting.  -Counseled on home exercise therapy and supportive care. -Mobic. -Could consider physical therapy.

## 2020-09-28 ENCOUNTER — Other Ambulatory Visit: Payer: Self-pay | Admitting: Family Medicine

## 2020-09-28 MED ORDER — LISDEXAMFETAMINE DIMESYLATE 40 MG PO CAPS: 40.0000 mg | ORAL_CAPSULE | ORAL | 0 refills | Status: DC

## 2020-10-16 ENCOUNTER — Other Ambulatory Visit: Payer: Self-pay | Admitting: Family Medicine

## 2020-10-21 ENCOUNTER — Encounter: Payer: Self-pay | Admitting: Family Medicine

## 2020-10-21 ENCOUNTER — Ambulatory Visit: Payer: Managed Care, Other (non HMO) | Admitting: Family Medicine

## 2020-10-21 ENCOUNTER — Telehealth: Payer: Self-pay | Admitting: *Deleted

## 2020-10-21 ENCOUNTER — Other Ambulatory Visit: Payer: Self-pay

## 2020-10-21 VITALS — BP 122/73 | HR 76 | Temp 98.4°F | Resp 17

## 2020-10-21 DIAGNOSIS — K047 Periapical abscess without sinus: Secondary | ICD-10-CM | POA: Diagnosis not present

## 2020-10-21 MED ORDER — CLINDAMYCIN HCL 300 MG PO CAPS: 300.0000 mg | ORAL_CAPSULE | Freq: Three times a day (TID) | ORAL | 0 refills | Status: DC

## 2020-10-21 NOTE — Patient Instructions (Signed)
Antibiotic sent in. I would recommend taking an over-the-counter probiotic while on this antibiotic.  Follow-up with your dentist next week.

## 2020-10-21 NOTE — Telephone Encounter (Signed)
Pt scheduled with Caleen Jobs, NP this afternoon.

## 2020-10-21 NOTE — Progress Notes (Signed)
Acute Office Visit  Subjective:    Patient ID: Alan Soto. Shaub, male    DOB: Jan 22, 1986, 35 y.o.   MRN: [id number]  Chief Complaint  Patient presents with   Dental Pain    HPI Patient is in today for possible dental infection  Wisdom teeth were removed on Tuesday and patient had been doing fairly well. Last night he woke up with fever (102F), chills, increased mouth pain (mostly on the right), body aches, feeling awful in general. He took a COVID test today which was negative. He is getting some improvement with Tylenol every 4-6 hours, but still feeling bad overall. Pain is about 5-7/10 on right side of mouth, radiating into jaw and ear. He has not noticed any drainage from the surgery sites.  He denies any sinus pain, rhinorrhea, cough, congestion, nausea, vomiting, diarrhea, urinary symptoms, rashes, chest pain, shortness of breath.  Past Medical History:  Diagnosis Date   Asthma 02/27/1992   As per patient   Deviated septum 01/05/2016   Visit with ENT 01/12/2016: Turbinate reduction, septoplasty, bilateral maxillary antrostomy planned by ENT. 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   Esophageal reflux 06/22/2015   Status post EGD 06/03/2014. 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 Labs reviewed, there was 1 mention of abnormally low testosterone but other values had been normal. Labs done by myself were normal. Consider endocrine referral if patient wants to pursue this issue further     Past Surgical History:  Procedure Laterality Date   CHOLECYSTECTOMY  01/2015   SHOULDER SURGERY  11/2014    History reviewed. No pertinent family history.  Social History   Socioeconomic History   Marital status: Divorced    Spouse name: Not on file   Number of children: Not on file   Years of education: Not on file   Highest education level: Not on file  Occupational History   Not on file  Tobacco Use   Smoking status: Never   Smokeless tobacco: Never  Substance and Sexual Activity   Alcohol use: Not on file   Drug use: Not on file   Sexual activity: Not on file  Other Topics Concern   Not on file  Social History Narrative   Not on file   Social Determinants of Health   Financial Resource Strain: Not on file  Food Insecurity: Not on file  Transportation Needs: Not on file  Physical Activity: Not on file  Stress: Not on file  Social Connections: Not on file  Intimate Partner Violence: Not on file    Outpatient Medications Prior to Visit  Medication Sig Dispense  Refill   azelastine (ASTELIN) 0.1 % nasal spray Place 2 sprays into both nostrils 2 (two) times daily. 30 mL 12   chlorthalidone (HYGROTON) 25 MG tablet Take 1 tablet (25 mg total) by mouth daily. 90 tablet 0   famotidine (PEPCID) 10 MG tablet Take 1 tablet (10 mg total) by mouth 2 (two) times daily. 90 tablet 3   Ferrous Sulfate (IRON PO) Take 1 tablet by mouth daily.     lisdexamfetamine ([name]) 40 MG capsule Take 1 capsule (40 mg total) by mouth  every morning. 30 capsule 0   meloxicam (MOBIC) 7.5 MG tablet Take 1 tablet (7.5 mg total) by mouth 2 (two) times daily as needed. 45 tablet 1   PROAIR HFA 108 (90 Base) MCG/ACT inhaler Inhale 1-2 puffs into the lungs every 6 (six) hours as needed for wheezing or shortness of breath. 18 g 5   No facility-administered medications prior to visit.    Allergies  Allergen Reactions   Penicillins Shortness Of Breath and Anaphylaxis    Review of Systems All review of systems negative except what is listed in the HPI     Objective:    Physical Exam Vitals reviewed.  Constitutional:      Appearance: Normal appearance.  HENT:     Head: Normocephalic and atraumatic.     Right Ear: Tympanic membrane normal.     Left Ear: Tympanic membrane normal.     Nose: Nose normal.     Mouth/Throat:     Mouth: Mucous membranes are moist.     Pharynx: Oropharynx is clear.     Comments: Mild edema at location of wisdom teeth removal, no significant erythema or drainage noted Cardiovascular:     Rate and Rhythm: Normal rate and regular rhythm.     Heart sounds: Normal heart sounds.  Pulmonary:     Effort: Pulmonary effort is normal.     Breath sounds: Normal breath sounds.  Musculoskeletal:     Cervical back: Normal range of motion and neck supple.  Lymphadenopathy:     Cervical: No cervical adenopathy.  Skin:    Findings: No rash.  Neurological:     Mental Status: He is alert and oriented to person, place, and time.  Psychiatric:        Mood and Affect: Mood normal.        Behavior: Behavior normal.        Thought Content: Thought content normal.        Judgment: Judgment normal.    BP 122/73   Pulse 76   Temp 98.4 F (36.9 C)   Resp 17   SpO2 96%  Wt Readings from Last 3 Encounters:  09/08/20 230 lb (104.3 kg)  01/13/20 223 lb 0.6 oz (101.2 kg)  07/31/19 225 lb 8 oz (102.3 kg)    Health Maintenance Due  Topic Date Due   Pneumococcal Vaccine 84-38 Years old (1 - PCV) Never done    Hepatitis C Screening  Never done   COVID-19 Vaccine (3 - Pfizer risk series) 07/28/2019   INFLUENZA VACCINE  09/26/2020    There are no preventive care reminders to display for this patient.   Lab Results  Component Value Date   TSH 1.32 01/13/2020   Lab Results  Component Value Date   WBC 6.4 01/13/2020   HGB 14.0 01/13/2020   HCT 44.0 01/13/2020   MCV 80.3 01/13/2020   PLT 368 01/13/2020   Lab Results  Component Value Date  NA 140 01/13/2020   K 4.7 01/13/2020   CO2 26 01/13/2020   GLUCOSE 82 01/13/2020   BUN 15 01/13/2020   CREATININE 0.73 01/13/2020   BILITOT 0.3 01/13/2020   ALKPHOS 44 12/16/2015   AST 20 01/13/2020   ALT 18 01/13/2020   PROT 7.2 01/13/2020   ALBUMIN 4.6 12/16/2015   CALCIUM 9.9 01/13/2020   Lab Results  Component Value Date   CHOL 225 (H) 01/13/2020   Lab Results  Component Value Date   HDL 54 01/13/2020   Lab Results  Component Value Date   LDLCALC 147 (H) 01/13/2020   Lab Results  Component Value Date   TRIG 120 01/13/2020   Lab Results  Component Value Date   CHOLHDL 4.2 01/13/2020   No results found for: HGBA1C     Assessment & Plan:   1. Dental infection If symptoms improve over the next 24 hours, could be viral, but given fevers, pain, and recent surgery would like to send him an antibiotic for potential dental abscess/infection. Using clindamycin since he is severely allergic to PCNs. Recommend he take a probiotic while on this medication. Continue tylenol, ibuprofen, salt water rinses, and post-op care as recommended by dentist. Recommend he see dentist next week to follow-up.   - clindamycin (CLEOCIN) 300 MG capsule; Take 1 capsule (300 mg total) by mouth 3 (three) times daily.  Dispense: 21 capsule; Refill: 0  Patient aware of signs/symptoms requiring further/urgent evaluation.  Follow-up as needed.   Purcell Nails Olevia Bowens, DNP, FNP-C

## 2020-10-21 NOTE — Telephone Encounter (Signed)
Pt left vm asking if he could possibly get an abx called in.  He recently had some wisdom teeth extracted and started developing a fever & body aches last night.  Temp running around 101.  Neg home covid test.  He tried contacting his dentist but they are closed today.  Please advise.

## 2020-10-27 ENCOUNTER — Other Ambulatory Visit: Payer: Self-pay | Admitting: Osteopathic Medicine

## 2020-10-28 MED ORDER — LISDEXAMFETAMINE DIMESYLATE 40 MG PO CAPS: 40.0000 mg | ORAL_CAPSULE | ORAL | 0 refills | Status: DC

## 2020-10-28 NOTE — Telephone Encounter (Signed)
Patient last officially seen for ADHD follow-up about 6 months ago, needs to come in to either follow-up with me to maintain another 6 months of refills, or can establish with Joy/Matthews

## 2020-11-02 NOTE — Telephone Encounter (Signed)
I called and left a message advising of recommendations.

## 2020-11-16 ENCOUNTER — Encounter: Payer: Self-pay | Admitting: Osteopathic Medicine

## 2020-11-16 ENCOUNTER — Ambulatory Visit: Payer: Managed Care, Other (non HMO) | Admitting: Osteopathic Medicine

## 2020-11-16 VITALS — BP 138/65 | HR 75 | Temp 98.0°F | Ht 73.0 in | Wt 228.0 lb

## 2020-11-16 DIAGNOSIS — G4489 Other headache syndrome: Secondary | ICD-10-CM

## 2020-11-16 MED ORDER — SUMATRIPTAN 20 MG/ACT NA SOLN: 20.0000 mg | NASAL | 0 refills | Status: DC | PRN

## 2020-11-16 NOTE — Progress Notes (Signed)
Alan Soto is a 35 y.o. male who presents to  [facility]  today, 11/16/20, seeking care for the following:  Headache Location: on R, starts in temple and radiates dow to the occipital area  and feels like it's behind the eye and into jaw/gums Quality: sharp, occasionally "drilling" sensation  Severity: 10/10 at worse, 2/10 now  Duration/Timing: Onset 5-6 days ago. For the first 4 days was only happening at night then was once in day / once at night, waking him up at night, lasting about 60-90 mins but resolves on its own.  Modifying factors: Tried his old Rx for Imitrex, hard to tell if this helped  Assoc signs/symptoms: Feels in jaw/gums but also had widsom teeth out about a month ago. NO visual disturbance. NO jaw claudication.  Context: similar headaches when he lived in [place] about 8 years ago, MRI was ok at the time       [place] with other pertinent findings:  The encounter diagnosis was Other headache syndrome. Ddx includes trigeminal neuralgia, cluster headache, migraine variant, less likely GCA/temporal arteritis or aneurysm/vascular   Patient Instructions  Plan:   Suspect cluster headache syndrome, less likely but also possible trigeminal neuralgia or other inflammation of nerves (this may make sense w/ recent dental issues), versus migraine variant. I have low suspicion for temporal arteritis as this usually occurs in older folks and also has visual changes, jaw pain w/ chewing, can have fever - will get labs to evaluate for this because if inflammatory markers are really high we will need to start high dose steroids and refer for biopsy. This also doesn't seem consistent with aneurysm  I think let's get labs and MRI to evaluate for less likely causes. Let's treat for the more likely diagnosis of cluster headache vs migraine vs neuralgia. I'm sending new Rx for Imitrex nasal spray which should work much  faster than the pills.   REASONS TO GO TO HOSPITAL: vision changes, limb weakness, drooping face, speech changes, severe unrelenting pain despite 1-2 doses of Imitrex    Orders Placed This Encounter  Procedures   MR Angiogram Head Wo Contrast   MR Brain W Wo Contrast   Sedimentation rate   High sensitivity CRP    Meds ordered this encounter  Medications   SUMAtriptan (IMITREX) 20 MG/ACT nasal spray    Sig: Place 1 spray (20 mg total) into the nose every 2 (two) hours as needed for migraine or headache. May repeat in 2 hours if headache persists or recurs.    Dispense:  1 each    Refill:  0     See below for relevant physical exam findings  See below for recent lab and imaging results reviewed  Medications, allergies, PMH, PSH, SocH, FamH reviewed below    Follow-up instructions: Return for RECHECK PENDING RESULTS / IF WORSE OR CHANGE.                                        Exam:  BP 138/65   Pulse 75   Temp 98 F (36.7 C)   Ht 6\' 1"  (1.854 m)   Wt 228 lb (103.4 kg)   SpO2 100%   BMI 30.08 kg/m  Constitutional: VS see above. General Appearance: alert, well-developed, well-nourished, NAD Neck: No masses, trachea midline. Normal ROM.  HEENT: EOMI, no  nystagmus, limited non dilated funduscopic exam no papilledema, normal TM bilaterally  Respiratory: Normal respiratory effort. no wheeze, no rhonchi, no rales Cardiovascular: S1/S2 normal, no murmur, no rub/gallop auscultated. RRR.  Musculoskeletal: Gait normal. Symmetric and independent movement of all extremities Neurological: Normal balance/coordination. No tremor. Normal cerebellar reflexes. Mild tenderness temple area but not severe.  Skin: warm, dry, intact.  Psychiatric: Normal judgment/insight. Normal mood and affect. Oriented x3.   Current Meds  Medication Sig   azelastine (ASTELIN) 0.1 % nasal spray Place 2 sprays into both nostrils 2 (two) times daily.   famotidine  (PEPCID) 10 MG tablet Take 1 tablet (10 mg total) by mouth 2 (two) times daily.   Ferrous Sulfate (IRON PO) Take 1 tablet by mouth daily.   lisdexamfetamine ([name]) 40 MG capsule Take 1 capsule (40 mg total) by mouth every morning.   meloxicam (MOBIC) 7.5 MG tablet Take 1 tablet (7.5 mg total) by mouth 2 (two) times daily as needed.   PROAIR HFA 108 (90 Base) MCG/ACT inhaler Inhale 1-2 puffs into the lungs every 6 (six) hours as needed for wheezing or shortness of breath.   SUMAtriptan (IMITREX) 20 MG/ACT nasal spray Place 1 spray (20 mg total) into the nose every 2 (two) hours as needed for migraine or headache. May repeat in 2 hours if headache persists or recurs.    Allergies  Allergen Reactions   Penicillins Shortness Of Breath and Anaphylaxis    Patient Active Problem List   Diagnosis Date Noted   Knee effusion, right 09/08/2020   It band syndrome, left 09/08/2020   Benign mole 06/04/2017   Chronic pain of left ankle 06/04/2017   Mild intermittent asthma with exacerbation 06/04/2017   Scalp irritation 09/26/2016   Recurrent sinusitis 09/26/2016   Deviated septum 01/05/2016   Adult attention deficit disorder 12/18/2015   Eosinophilia 07/22/2015   History of ADHD 07/21/2015   Mild sleep apnea 07/21/2015   Labral tear of shoulder 07/21/2015   Testosterone deficiency 06/22/2015   Chronic fatigue 06/22/2015   Seasonal allergies 06/22/2015   Esophageal reflux 06/22/2015   History of anemia 06/22/2015   History of cholecystectomy 06/22/2015   S/P vasectomy 06/22/2015   Essential hypertension 06/22/2015    History reviewed. No pertinent family history.  Social History   Tobacco Use  Smoking Status Never  Smokeless Tobacco Never    Past Surgical History:  Procedure Laterality Date   CHOLECYSTECTOMY  01/2015   SHOULDER SURGERY  11/2014    Immunization History  Administered Date(s) Administered   PFIZER(Purple Top)SARS-COV-2 Vaccination 06/09/2019, 06/30/2019    Tdap 06/04/2017, 05/10/2018    No results found for this or any previous visit (from the past 2160 hour(s)).  No results found.     All questions at time of visit were answered - patient instructed to contact office with any additional concerns or updates. ER/RTC precautions were reviewed with the patient as applicable.   Please note: manual typing as well as voice recognition software may have been used to produce this document - typos may escape review. Please contact Dr. Sheppard Coil for any needed clarifications.   Total encounter time on date of service, 11/16/20, was 40 minutes spent addressing problems/issues as noted above in [place], including time spent in discussion with patient regarding the HPI, ROS, confirming history, reviewing Assessment & Plan, as well as time spent on coordination of care, record review.

## 2020-11-16 NOTE — Patient Instructions (Addendum)
Plan:   Suspect cluster headache syndrome, less likely but also possible trigeminal neuralgia or other inflammation of nerves (this may make sense w/ recent dental issues), versus migraine variant. I have low suspicion for temporal arteritis as this usually occurs in older folks and also has visual changes, jaw pain w/ chewing, can have fever - will get labs to evaluate for this because if inflammatory markers are really high we will need to start high dose steroids and refer for biopsy. This also doesn't seem consistent with aneurysm  I think let's get labs and MRI to evaluate for less likely causes. Let's treat for the more likely diagnosis of cluster headache vs migraine vs neuralgia. I'm sending new Rx for Imitrex nasal spray which should work much faster than the pills.   REASONS TO GO TO HOSPITAL: vision changes, limb weakness, drooping face, speech changes, severe unrelenting pain despite 1-2 doses of Imitrex

## 2020-11-17 LAB — SEDIMENTATION RATE: Sed Rate: 6 mm/h (ref 0–15)

## 2020-11-17 LAB — HIGH SENSITIVITY CRP: hs-CRP: 3 mg/L

## 2020-11-18 ENCOUNTER — Other Ambulatory Visit (HOSPITAL_COMMUNITY): Payer: Managed Care, Other (non HMO)

## 2020-11-21 ENCOUNTER — Telehealth: Payer: Self-pay | Admitting: *Deleted

## 2020-11-21 ENCOUNTER — Encounter: Payer: Self-pay | Admitting: Osteopathic Medicine

## 2020-11-21 MED ORDER — SUMATRIPTAN 20 MG/ACT NA SOLN: 20.0000 mg | NASAL | 0 refills | Status: DC | PRN

## 2020-11-21 NOTE — Telephone Encounter (Signed)
Pt called this morning wanting to know if he can have a refill of the Imitrex nasal spray.  He has not had his MRI done yet due to ins and scheduling conflicts but wants to go downstairs this weekend for it.  I will contact his ins again to have the location changed.  Please advise on the med refill.

## 2020-11-21 NOTE — Telephone Encounter (Signed)
Pt made aware of refill in a separate mychart message.

## 2020-11-21 NOTE — Telephone Encounter (Signed)
I will send in the refill, we will do this for a maximum of 3 months at which point he needs a new PCP.  In addition if he does not have the MRI in the chart by the time he requests another refill it will be denied.

## 2020-11-22 ENCOUNTER — Telehealth: Payer: Self-pay

## 2020-11-22 NOTE — Telephone Encounter (Signed)
Denied, needs the MRI first.

## 2020-11-22 NOTE — Telephone Encounter (Signed)
[facility] pharmacy called to advise patient is requesting additional quantity for the sumatriptan 20 mg nasal spray.  6 doses last 4 days . They would like more sent in to [facility] on file .

## 2020-11-23 NOTE — Telephone Encounter (Signed)
Finally, now to see if he actually gets it done.

## 2020-11-23 NOTE — Telephone Encounter (Signed)
MRI is scheduled for Monday 10/3 at 8am.

## 2020-11-28 ENCOUNTER — Other Ambulatory Visit: Payer: Self-pay

## 2020-11-28 ENCOUNTER — Ambulatory Visit (INDEPENDENT_AMBULATORY_CARE_PROVIDER_SITE_OTHER): Payer: Managed Care, Other (non HMO)

## 2020-11-28 DIAGNOSIS — G4489 Other headache syndrome: Secondary | ICD-10-CM | POA: Diagnosis not present

## 2020-11-28 IMAGING — MR MR HEAD WO/W CM
11 of 12 series · 36 of 48 positions shown · IV contrast (gadavist)
Comparison: Maxillofacial CT [DATE].

CLINICAL DATA: Headache, chronic, new features or increased
frequency. Right-sided cluster headaches for 2 weeks.

EXAM:
MRI HEAD WITHOUT AND WITH CONTRAST
TECHNIQUE: Multiplanar, multiecho pulse sequences of the brain and surrounding
structures were obtained without and with intravenous contrast.
CONTRAST:  10mL GADAVIST GADOBUTROL 1 MMOL/ML IV SOLN

[Series 2: DWI · axial · 3.0mm · 1.46mm/px · z∈[-68,+93]mm · 8 of 110 slices shown (1 of 4)]
[im 1/110]
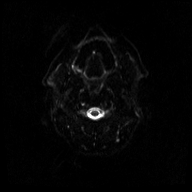
[im 16/110]
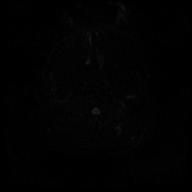
[im 32/110]
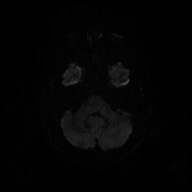
[im 47/110]
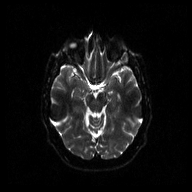
[im 63/110]
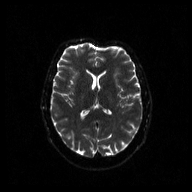
[im 78/110]
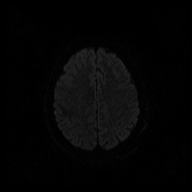
[im 94/110]
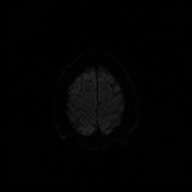
[im 110/110]
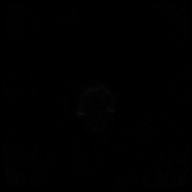

[Series 3: DWI · axial · 3.0mm · 1.46mm/px · z∈[-68,+93]mm · 4 of 54 slices shown (2 of 4)]
[im 1/54]
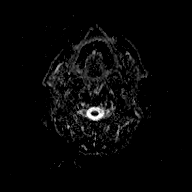
[im 18/54]
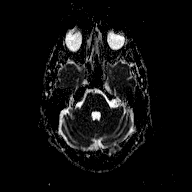
[im 36/54]
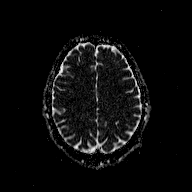
[im 54/54]
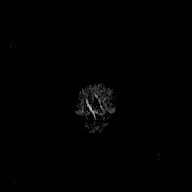

[Series 4: DWI · coronal · 5.0mm · 1.46mm/px · 4 of 64 slices shown (3 of 4)]
[im 1/64]
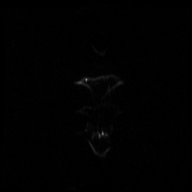
[im 22/64]
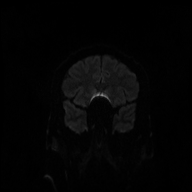
[im 43/64]
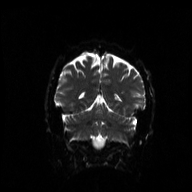
[im 64/64]
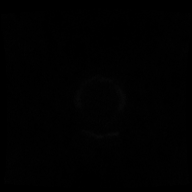

[Series 5: DWI · coronal · 5.0mm · 1.46mm/px · 2 of 32 slices shown (4 of 4)]
[im 1/32]
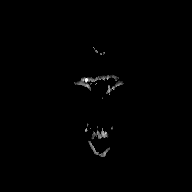
[im 32/32]
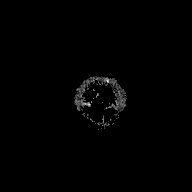

[Series 6: T1 · sagittal · 5.0mm · 0.36mm/px · 1 of 23 slices shown]
[im 1/23]
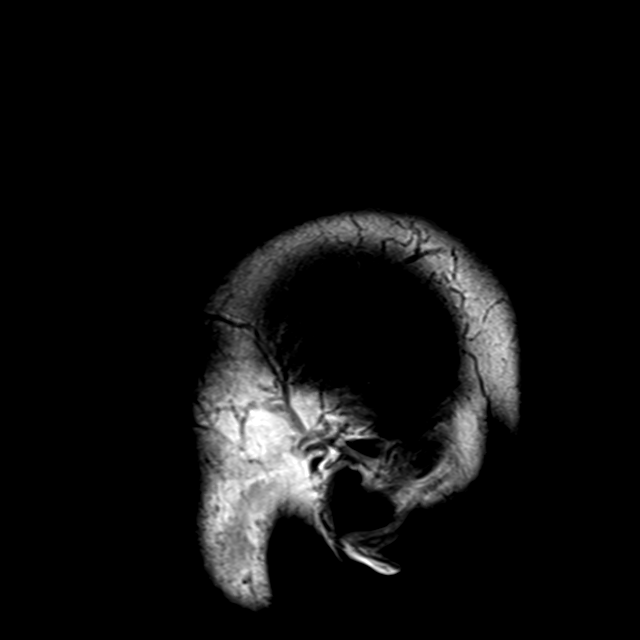

[Series 7: T2 · axial · 5.0mm · 0.60mm/px · 1 of 24 slices shown (1 of 2)]
[im 1/24]
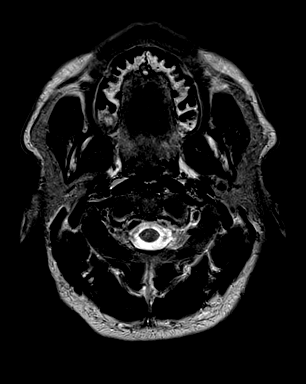

[Series 8: FLAIR · axial · 3.0mm · 0.45mm/px · z∈[-66,+95]mm · 3 of 55 slices shown]
[im 1/55]
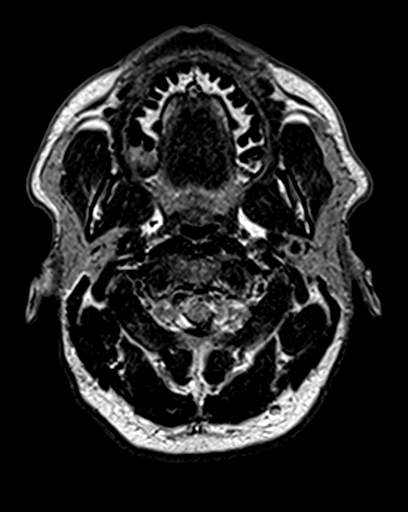
[im 28/55]
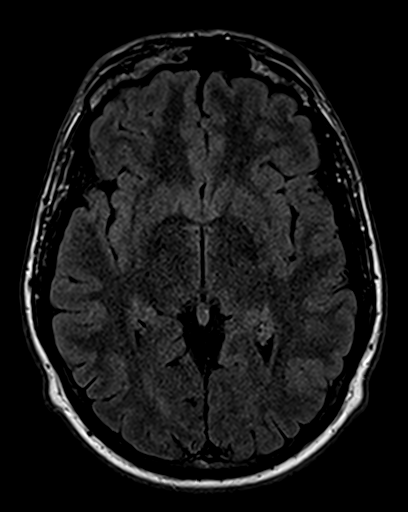
[im 55/55]
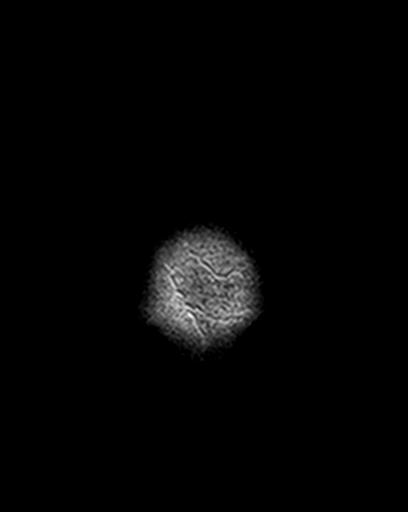

[Series 9: T2 · axial · 5.0mm · 0.72mm/px · 1 of 24 slices shown (2 of 2)]
[im 1/24]
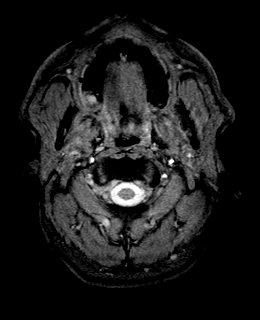

[Series 11: T2 post-contrast · coronal · 5.0mm · 0.69mm/px · 2 of 32 slices shown]
[im 1/32]
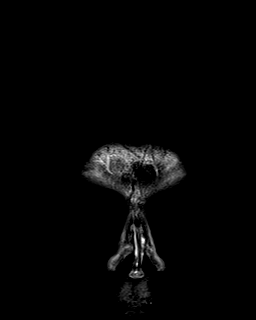
[im 32/32]
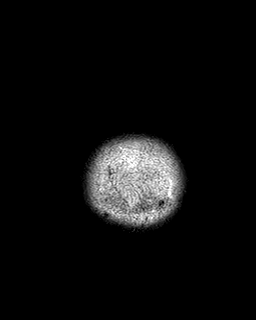

[Series 12: T1 post-contrast · axial · 1.0mm · 0.38mm/px · z∈[-63,+95]mm · 8 of 160 slices shown (1 of 2)]
[im 1/160]
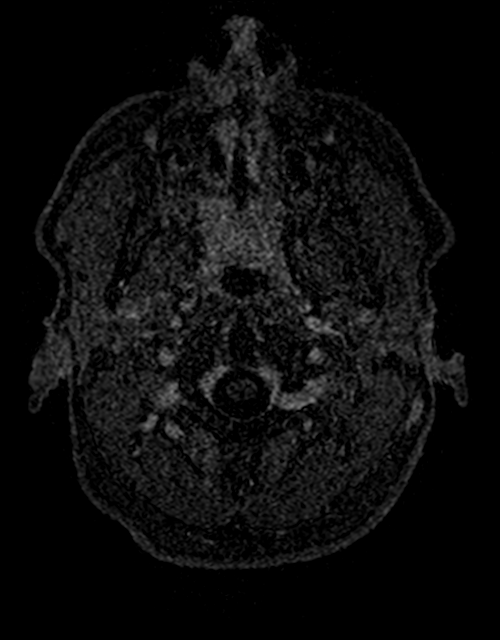
[im 18/160]
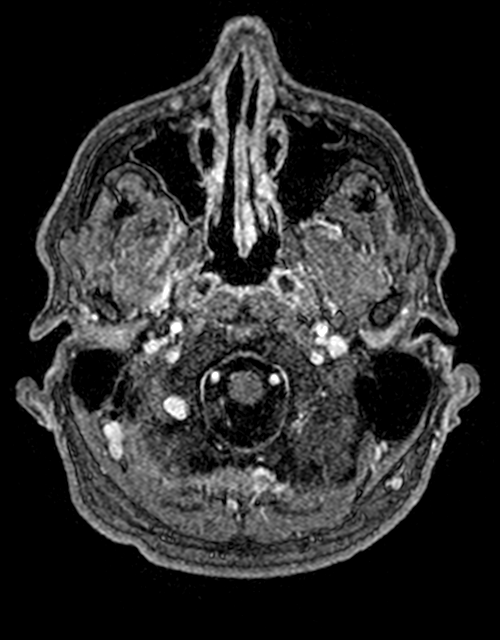
[im 54/160]
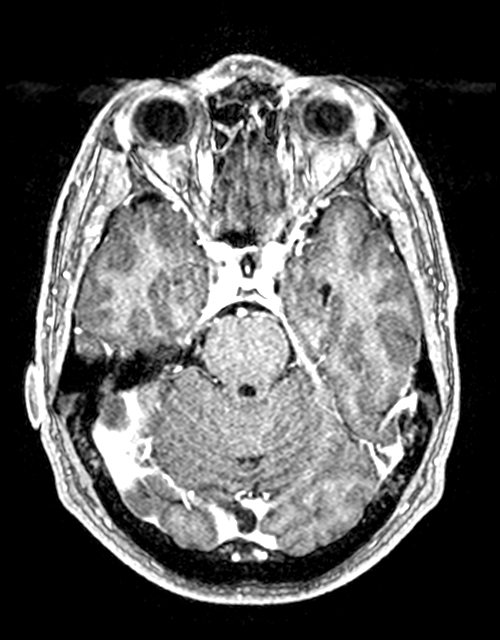
[im 71/160]
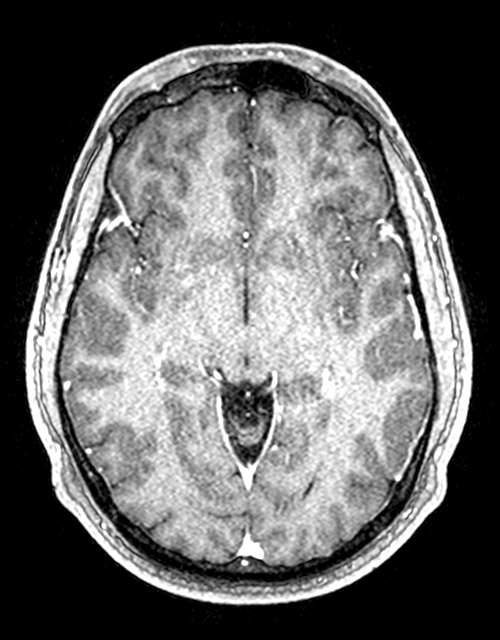
[im 89/160]
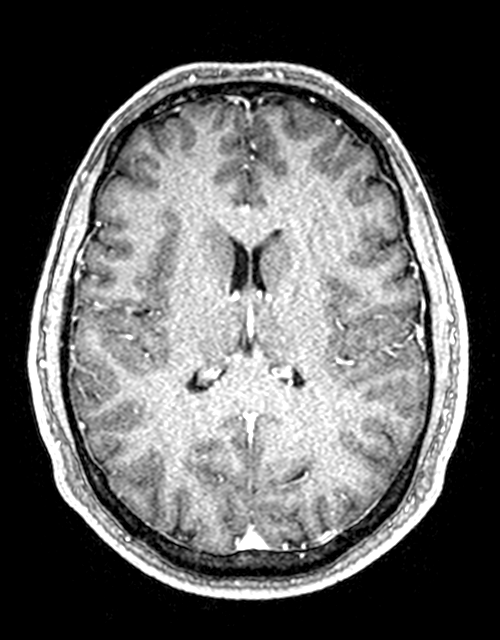
[im 107/160]
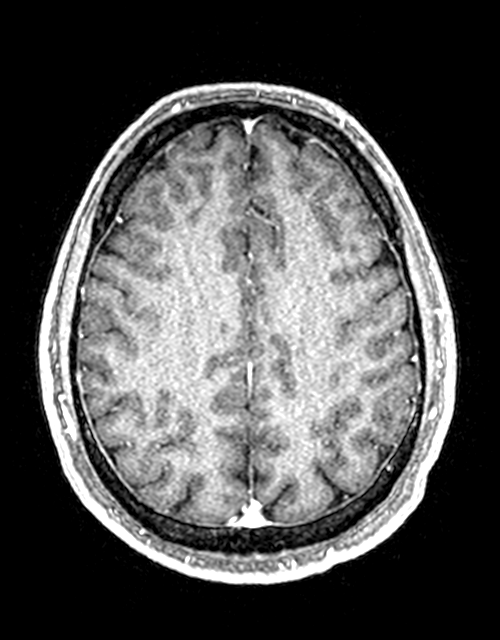
[im 142/160]
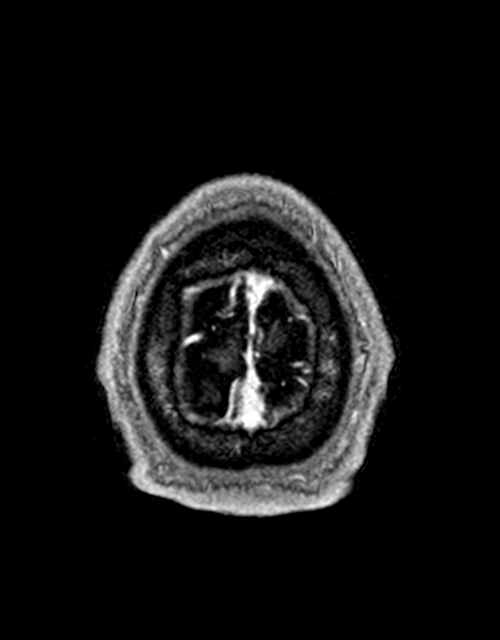
[im 160/160]
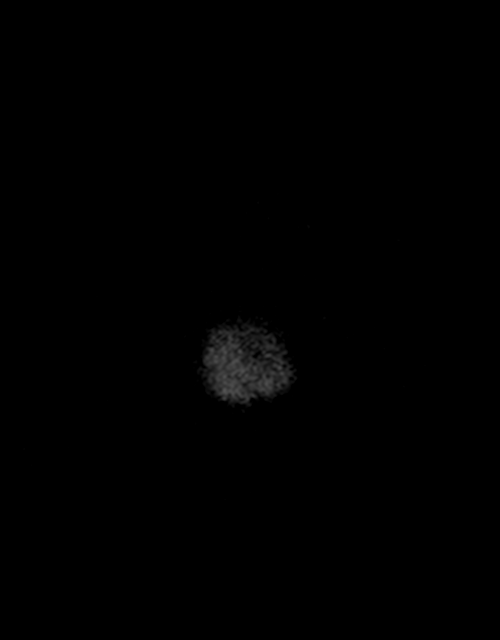

[Series 13: T1 post-contrast · coronal · 5.0mm · 0.69mm/px · 2 of 32 slices shown (2 of 2)]
[im 1/32]
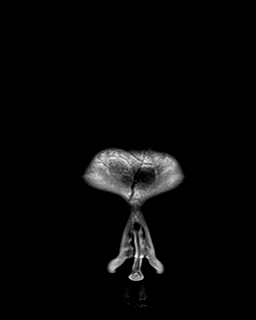
[im 32/32]
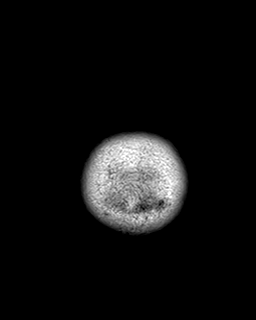

[36 of 48 positions shown; findings below may reference images not displayed]

FINDINGS: Brain: No acute infarct, hemorrhage, or mass lesion is present. No
significant white matter lesions are present. The ventricles are of
normal size. No significant extraaxial fluid collection is present.
The internal auditory canals are within normal limits. The brainstem
and cerebellum are within normal limits.

Vascular: Flow is present in the major intracranial arteries.

Skull and upper cervical spine: The craniocervical junction is
normal. Upper cervical spine is within normal limits. Marrow signal
is unremarkable.

Sinuses/Orbits: Bilateral maxillary antrostomies and partial
ethmoidectomies noted. Mucosal thickening present in the residual
anterior ethmoid air cells. Mucosal thickening is present in the
inferior right maxillary sinus without fluid levels. Hypoplastic
right frontal sinus is chronically opacified.

The globes and orbits are within normal limits.
IMPRESSION: 1. Normal MRI appearance of the brain. No acute or focal lesion to
explain the patient's headaches.
2. Postoperative changes of the paranasal sinuses.
3. Mucosal thickening in the residual anterior ethmoid air cells and
inferior right maxillary sinus. No fluid levels are present to
suggest acute sinusitis.

## 2020-11-28 MED ORDER — DOXYCYCLINE HYCLATE 100 MG PO TABS: 100.0000 mg | ORAL_TABLET | Freq: Two times a day (BID) | ORAL | 0 refills | Status: DC

## 2020-11-28 MED ORDER — GADOBUTROL 1 MMOL/ML IV SOLN
10.0000 mL | Freq: Once | INTRAVENOUS | Status: AC | PRN
  Administered 2020-11-28: 10 mL via INTRAVENOUS

## 2020-11-28 NOTE — Addendum Note (Signed)
Addended by: Donella Stade on: 11/28/2020 02:03 PM   Modules accepted: Orders

## 2020-11-28 NOTE — Progress Notes (Signed)
MRI normal appearance of brain but you do have sinusitis in ethmoid and maxillary sinus on right. Sent doxycycline due to PCN allergy. Ok to use flonase with abx.

## 2020-12-02 ENCOUNTER — Ambulatory Visit: Payer: Managed Care, Other (non HMO) | Admitting: Family Medicine

## 2020-12-02 ENCOUNTER — Encounter: Payer: Self-pay | Admitting: Family Medicine

## 2020-12-02 DIAGNOSIS — G44011 Episodic cluster headache, intractable: Secondary | ICD-10-CM

## 2020-12-02 DIAGNOSIS — F988 Other specified behavioral and emotional disorders with onset usually occurring in childhood and adolescence: Secondary | ICD-10-CM | POA: Diagnosis not present

## 2020-12-02 MED ORDER — LISDEXAMFETAMINE DIMESYLATE 40 MG PO CAPS: 40.0000 mg | ORAL_CAPSULE | ORAL | 0 refills | Status: DC

## 2020-12-02 MED ORDER — SUMATRIPTAN SUCCINATE 50 MG PO TABS: 50.0000 mg | ORAL_TABLET | ORAL | 3 refills | Status: AC | PRN

## 2020-12-02 MED ORDER — SUMATRIPTAN 20 MG/ACT NA SOLN: 20.0000 mg | NASAL | 3 refills | Status: DC | PRN

## 2020-12-03 ENCOUNTER — Encounter: Payer: Self-pay | Admitting: Family Medicine

## 2020-12-03 DIAGNOSIS — G44009 Cluster headache syndrome, unspecified, not intractable: Secondary | ICD-10-CM | POA: Insufficient documentation

## 2020-12-03 NOTE — Assessment & Plan Note (Signed)
He continues to do well with [name] at current strength.  We will continue this at 40 mg daily.  Updated prescription sent in.  Follow-up in 3 months.

## 2020-12-03 NOTE — Assessment & Plan Note (Signed)
Improved at this time.  He will continue Imitrex as needed.  I did send in nasal spray.  Also sent tablets and to [facility] which he will keep as backup due to limited supply of nasal spray.

## 2020-12-03 NOTE — Progress Notes (Signed)
Alan Soto - 35 y.o. male MRN [id number]  Date of birth: 10-03-1985  Subjective Chief Complaint  Patient presents with   Migraine    HPI Alan Soto is a 35 year old male here today for transfer of care visit.  He is a previous patient of Dr. Sheppard Soto.  He has a history of cluster headaches, ADHD and hypertension.  He has having increased frequency of cluster headaches over the past few weeks.  Recently had MRI which was unremarkable.  His cluster headaches have decreased in frequency over the past few days.  Imitrex has been effective for here both and nasal spray form as well as pill form.  He has had some difficulty getting nasal spray with only few doses were sent in with last prescription.  He would like a renewal of nasal spray and would like to have Imitrex tablets sent in for backup if he runs out of nasal spray.  He continues to do well with [name] for management of ADHD.  He has not noted any side effects related to this.  He denies increased anxiety, sleep difficulty or palpitations.  His blood pressures remain well controlled.  ROS:  A comprehensive ROS was completed and negative except as noted per HPI  Allergies  Allergen Reactions   Penicillins Shortness Of Breath and Anaphylaxis    Past Medical History:  Diagnosis Date   Asthma 02/27/1992   As per patient   Deviated septum 01/05/2016   Visit with ENT 01/12/2016: Turbinate reduction, septoplasty, bilateral maxillary antrostomy planned by ENT. 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   Esophageal reflux 06/22/2015   Status post EGD 06/03/2014. 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 Labs reviewed, there was 1 mention of abnormally low testosterone but other values had been normal. Labs done by myself were normal. Consider endocrine referral if patient wants to pursue this issue further     Past Surgical History:  Procedure Laterality Date   CHOLECYSTECTOMY  01/2015   SHOULDER SURGERY  11/2014    Social History   Socioeconomic History   Marital status: Divorced    Spouse name: Not on file   Number of children: Not on file   Years of education: Not on file   Highest education level: Not on file  Occupational History   Not on file  Tobacco Use   Smoking status: Never   Smokeless tobacco: Never  Substance and Sexual Activity   Alcohol use: Not on file   Drug use: Not on file   Sexual activity: Not on file  Other Topics Concern   Not on file  Social History Narrative  Not on file   Social Determinants of Health   Financial Resource Strain: Not on file  Food Insecurity: Not on file  Transportation Needs: Not on file  Physical Activity: Not on file  Stress: Not on file  Social Connections: Not on file    No family history on file.  Health Maintenance  Topic Date Due   Hepatitis C Screening  Never done   COVID-19 Vaccine (3 - Pfizer risk series) 07/28/2019   INFLUENZA VACCINE  05/26/2021 (Originally 09/26/2020)   TETANUS/TDAP  05/09/2028    HIV Screening  Completed   HPV VACCINES  Aged Out     ----------------------------------------------------------------------------------------------------------------------------------------------------------------------------------------------------------------- Physical Exam BP 113/80 (BP Location: Left Arm, Patient Position: Sitting, Cuff Size: Large)   Pulse 73   Temp 98.2 F (36.8 C)   Ht 6\' 1"  (1.854 m)   Wt 229 lb (103.9 kg)   SpO2 100%   BMI 30.21 kg/m   Physical Exam Constitutional:      Appearance: Normal appearance.  Eyes:     General: No scleral icterus. Cardiovascular:     Rate and Rhythm: Normal rate and regular rhythm.  Pulmonary:     Effort: Pulmonary effort is normal.     Breath sounds: Normal breath sounds.  Musculoskeletal:     Cervical back: Neck supple.  Neurological:     General: No focal deficit present.     Mental Status: He is alert.  Psychiatric:        Mood and Affect: Mood normal.        Behavior: Behavior normal.    ------------------------------------------------------------------------------------------------------------------------------------------------------------------------------------------------------------------- Assessment and Plan  Adult attention deficit disorder He continues to do well with [name] at current strength.  We will continue this at 40 mg daily.  Updated prescription sent in.  Follow-up in 3 months.  Cluster headaches Improved at this time.  He will continue Imitrex as needed.  I did send in nasal spray.  Also sent tablets and to [facility] which he will keep as backup due to limited supply of nasal spray.   Meds ordered this encounter  Medications   SUMAtriptan (IMITREX) 20 MG/ACT nasal spray    Sig: Place 1 spray (20 mg total) into the nose every 2 (two) hours as needed for migraine or headache (Do not exceed 2 doses in 24 hour period). May repeat in 2 hours if headache persists or recurs.     Dispense:  3 each    Refill:  3   lisdexamfetamine ([name]) 40 MG capsule    Sig: Take 1 capsule (40 mg total) by mouth every morning. Fill now    Dispense:  30 capsule    Refill:  0    If PA needed: on [name] in the past which controlled ADHD symptoms well. Adderall XR ineffective   lisdexamfetamine ([name]) 40 MG capsule    Sig: Take 1 capsule (40 mg total) by mouth every morning. Fill in 30 days    Dispense:  30 capsule    Refill:  0   lisdexamfetamine ([name]) 40 MG capsule    Sig: Take 1 capsule (40 mg total) by mouth every morning. Fill in 60 days    Dispense:  30 capsule    Refill:  0   SUMAtriptan (IMITREX) 50 MG tablet    Sig: Take 1 tablet (50 mg total) by mouth every 2 (two) hours as needed for migraine. Repeat in 2 hours if persists. MAX 3 tablets in 24 hours.    Dispense:  30 tablet    Refill:  3    Return in about 3 months (around 03/04/2021) for add.    This visit occurred during the SARS-CoV-2 public health emergency.  Safety protocols were in place, including screening questions prior to the visit, additional usage of staff PPE, and extensive cleaning of exam room while observing appropriate contact time as indicated for disinfecting solutions.

## 2021-02-15 ENCOUNTER — Other Ambulatory Visit: Payer: Self-pay | Admitting: Family Medicine

## 2021-02-15 ENCOUNTER — Encounter: Payer: Self-pay | Admitting: Family Medicine

## 2021-02-15 MED ORDER — LISDEXAMFETAMINE DIMESYLATE 40 MG PO CAPS: 40.0000 mg | ORAL_CAPSULE | ORAL | 0 refills | Status: DC

## 2021-04-03 ENCOUNTER — Other Ambulatory Visit: Payer: Self-pay

## 2021-04-03 ENCOUNTER — Ambulatory Visit (INDEPENDENT_AMBULATORY_CARE_PROVIDER_SITE_OTHER): Payer: Managed Care, Other (non HMO)

## 2021-04-03 ENCOUNTER — Ambulatory Visit: Payer: Managed Care, Other (non HMO) | Admitting: Sports Medicine

## 2021-04-03 DIAGNOSIS — M79661 Pain in right lower leg: Secondary | ICD-10-CM | POA: Insufficient documentation

## 2021-04-03 DIAGNOSIS — M5136 Other intervertebral disc degeneration, lumbar region: Secondary | ICD-10-CM

## 2021-04-03 DIAGNOSIS — G8929 Other chronic pain: Secondary | ICD-10-CM

## 2021-04-03 DIAGNOSIS — Z09 Encounter for follow-up examination after completed treatment for conditions other than malignant neoplasm: Secondary | ICD-10-CM | POA: Diagnosis not present

## 2021-04-03 DIAGNOSIS — S73199A Other sprain of unspecified hip, initial encounter: Secondary | ICD-10-CM | POA: Insufficient documentation

## 2021-04-03 DIAGNOSIS — M25561 Pain in right knee: Secondary | ICD-10-CM

## 2021-04-03 DIAGNOSIS — M51369 Other intervertebral disc degeneration, lumbar region without mention of lumbar back pain or lower extremity pain: Secondary | ICD-10-CM | POA: Insufficient documentation

## 2021-04-03 DIAGNOSIS — M545 Low back pain, unspecified: Secondary | ICD-10-CM | POA: Diagnosis not present

## 2021-04-03 DIAGNOSIS — M25552 Pain in left hip: Secondary | ICD-10-CM

## 2021-04-03 DIAGNOSIS — S86811A Strain of other muscle(s) and tendon(s) at lower leg level, right leg, initial encounter: Secondary | ICD-10-CM | POA: Diagnosis not present

## 2021-04-03 IMAGING — DX DG KNEE 1-2V*L*
2 series · 2 of 2 positions shown · non-contrast
Comparison: None.

CLINICAL DATA: Right knee pain.  Left knee for comparison.

EXAM:
RIGHT KNEE - COMPLETE 4+ VIEW; LEFT KNEE - 1-2 VIEW

[knee ap bilat standing]
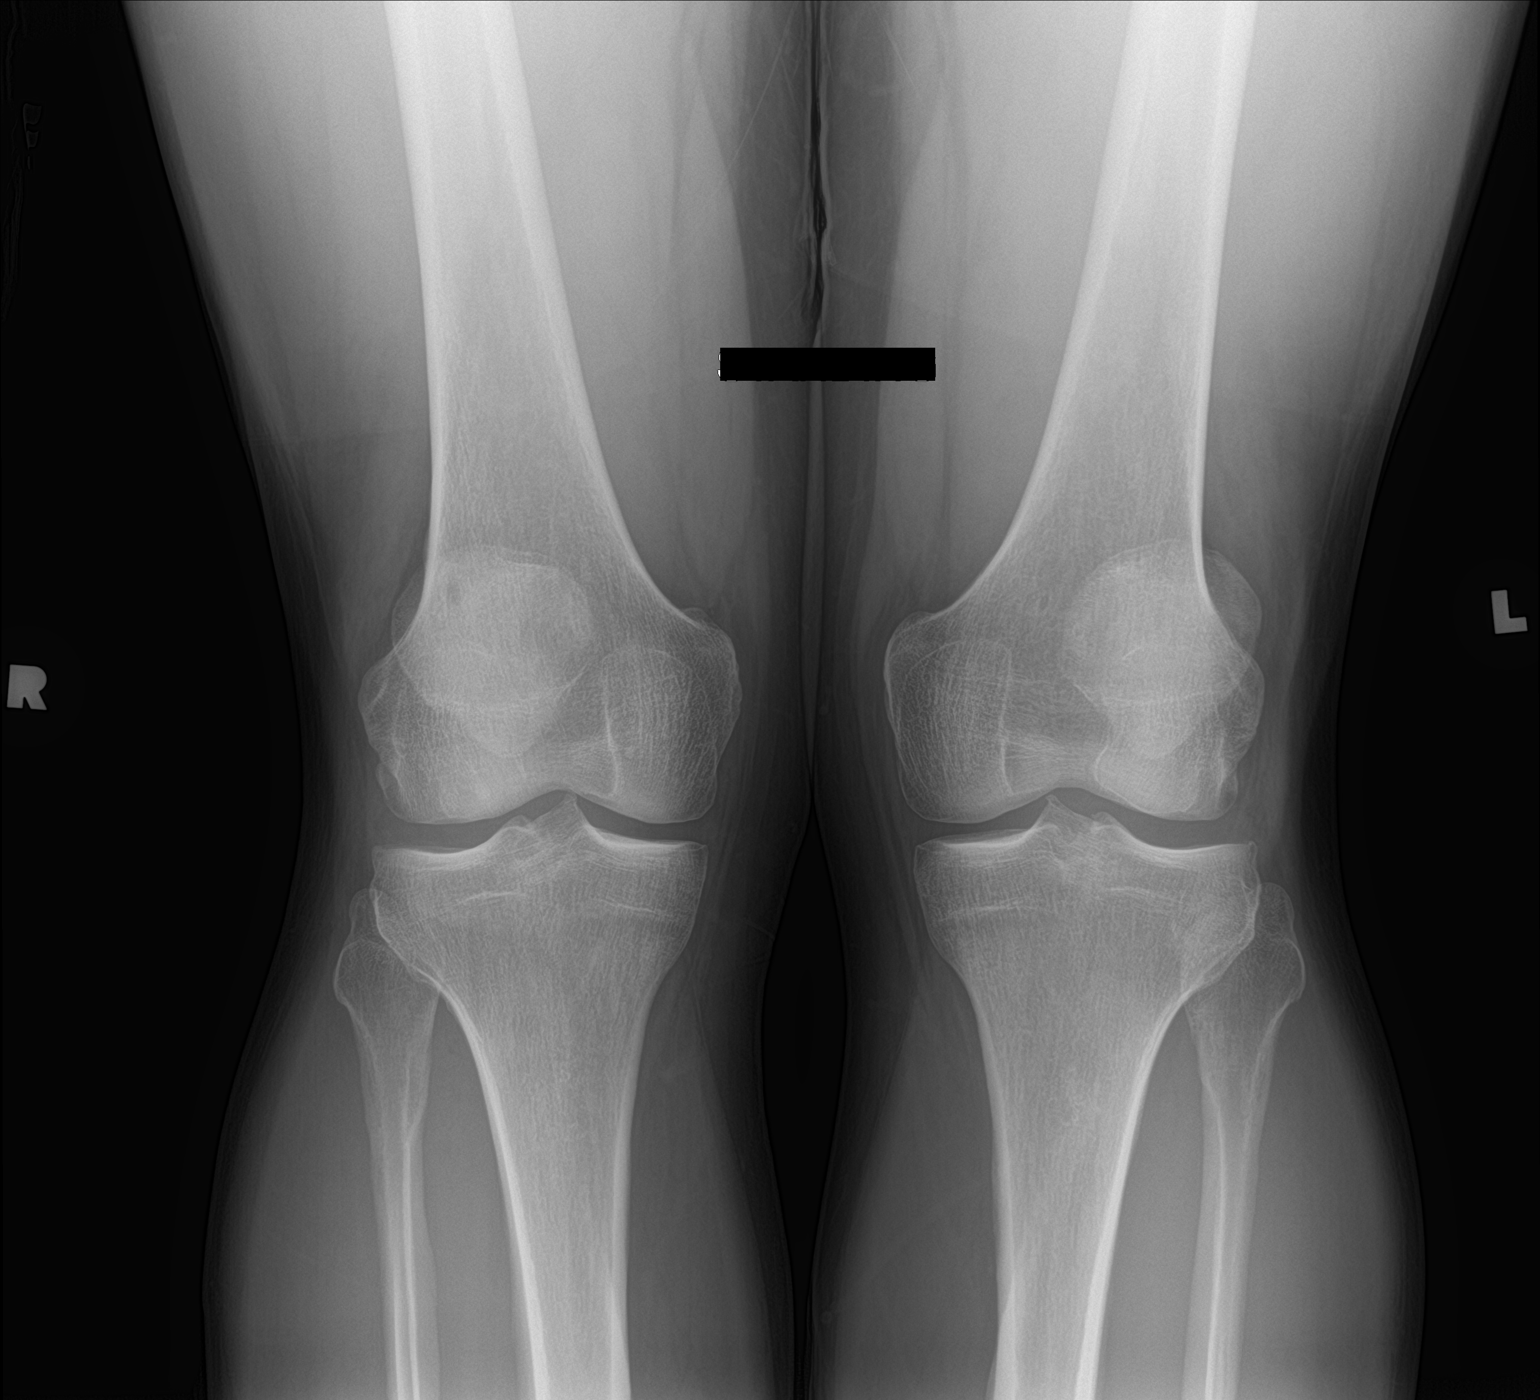

[knee tunnel]
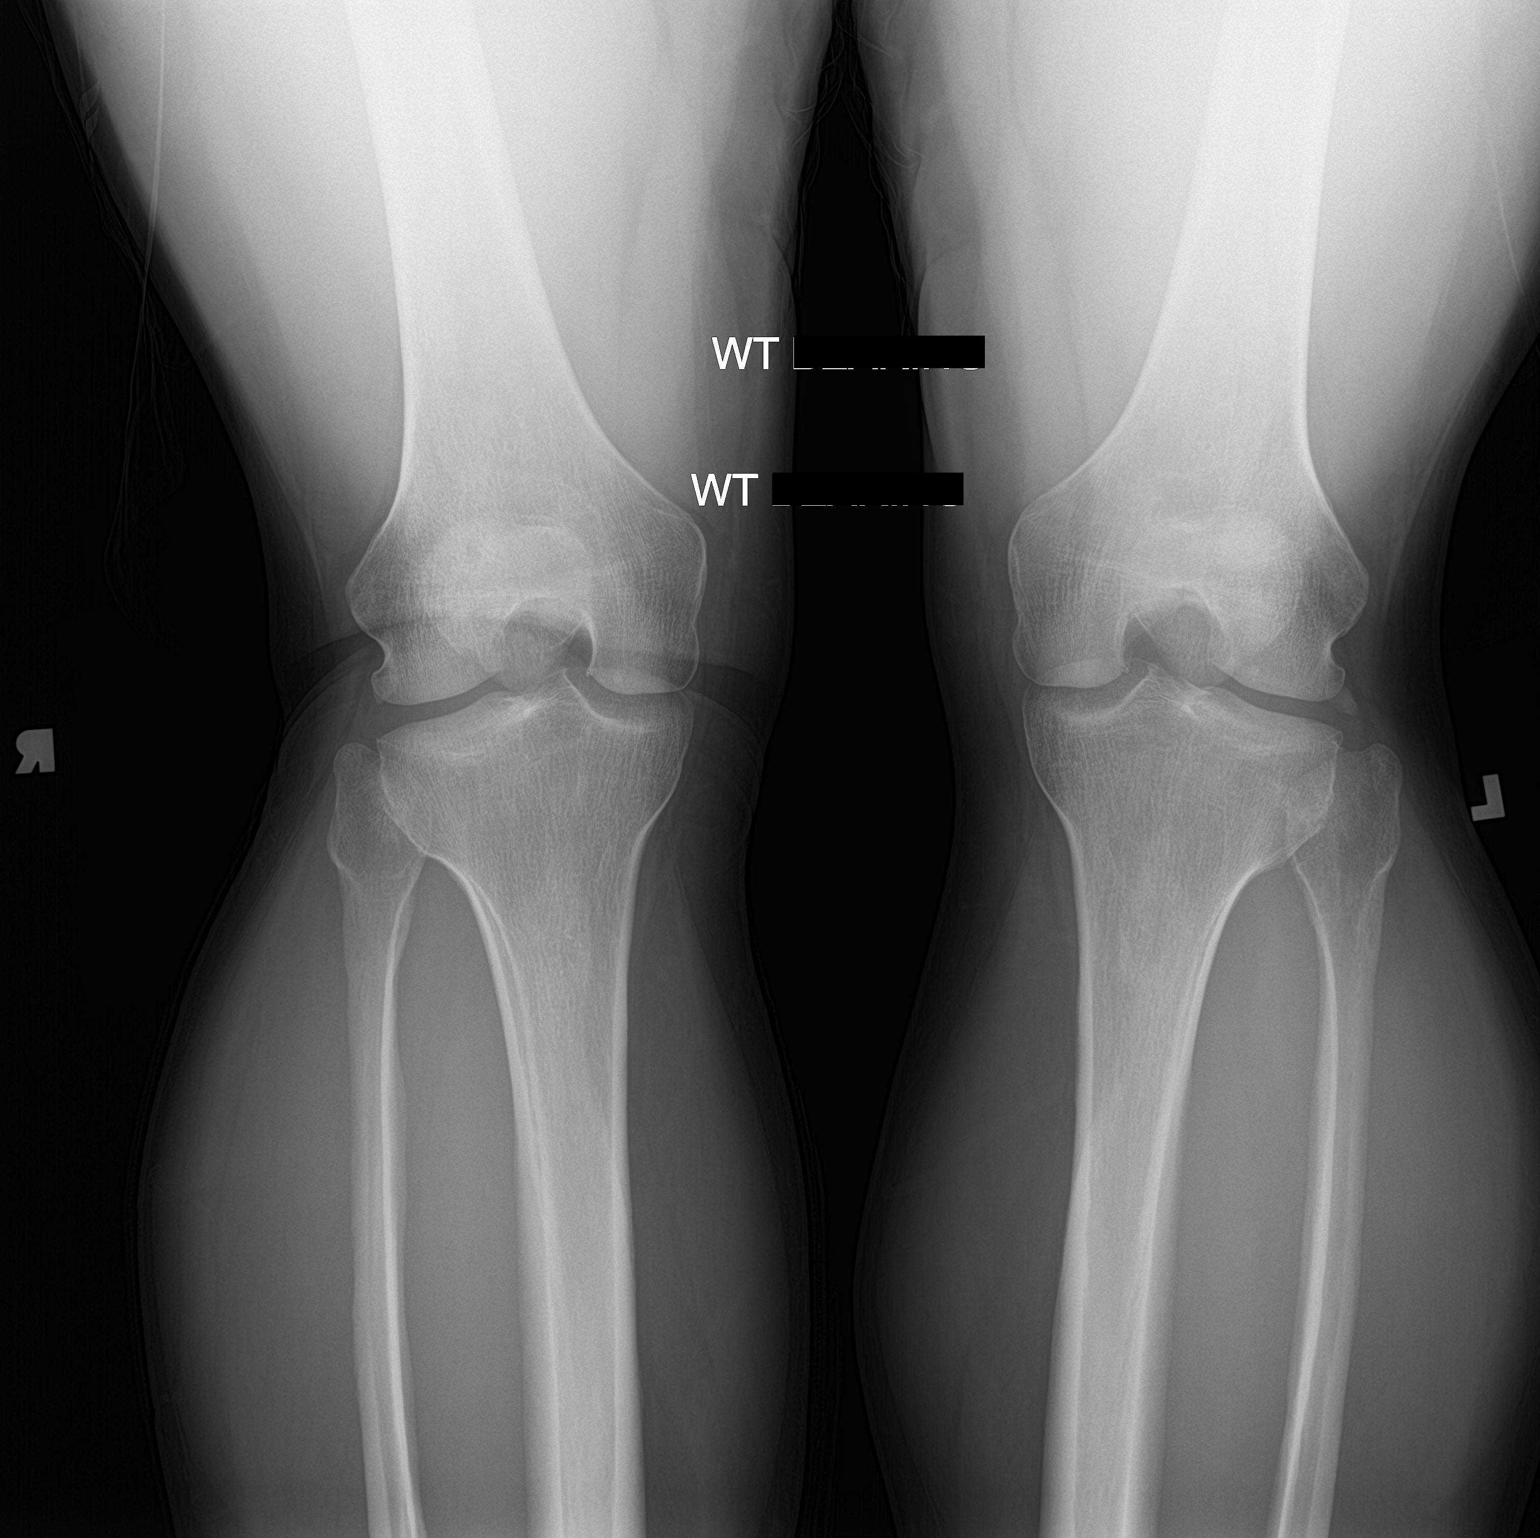

[2 of 2 positions shown; findings below may reference images not displayed]

FINDINGS: Right knee: No evidence of fracture, dislocation, or joint effusion.
No evidence of arthropathy or other focal bone abnormality. Soft
tissues are unremarkable.

Left knee: No evidence of fracture, dislocation, or joint effusion.
No evidence of arthropathy or other focal bone abnormality. Soft
tissues are unremarkable.
IMPRESSION: Negative.

## 2021-04-03 IMAGING — DX DG KNEE COMPLETE 4+V*R*
4 series · 4 of 4 positions shown · non-contrast
Comparison: None.

CLINICAL DATA: Right knee pain.  Left knee for comparison.

EXAM:
RIGHT KNEE - COMPLETE 4+ VIEW; LEFT KNEE - 1-2 VIEW

[knee lat]
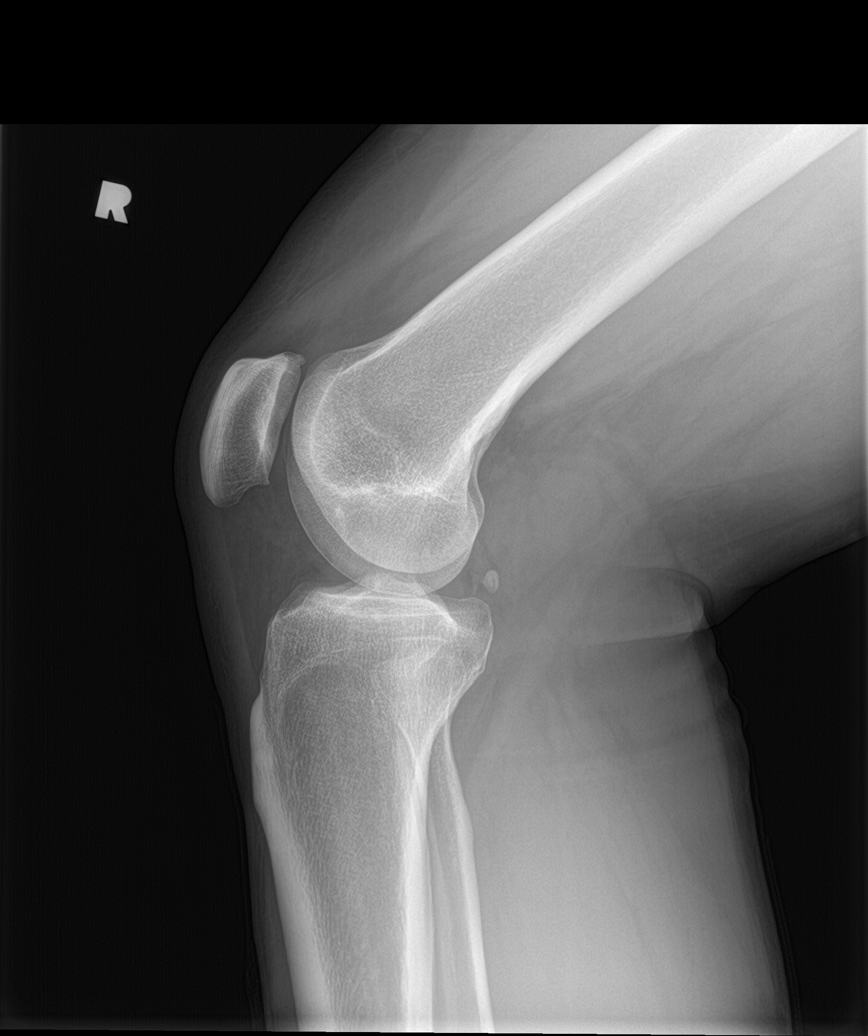

[knee ap bilat standing]
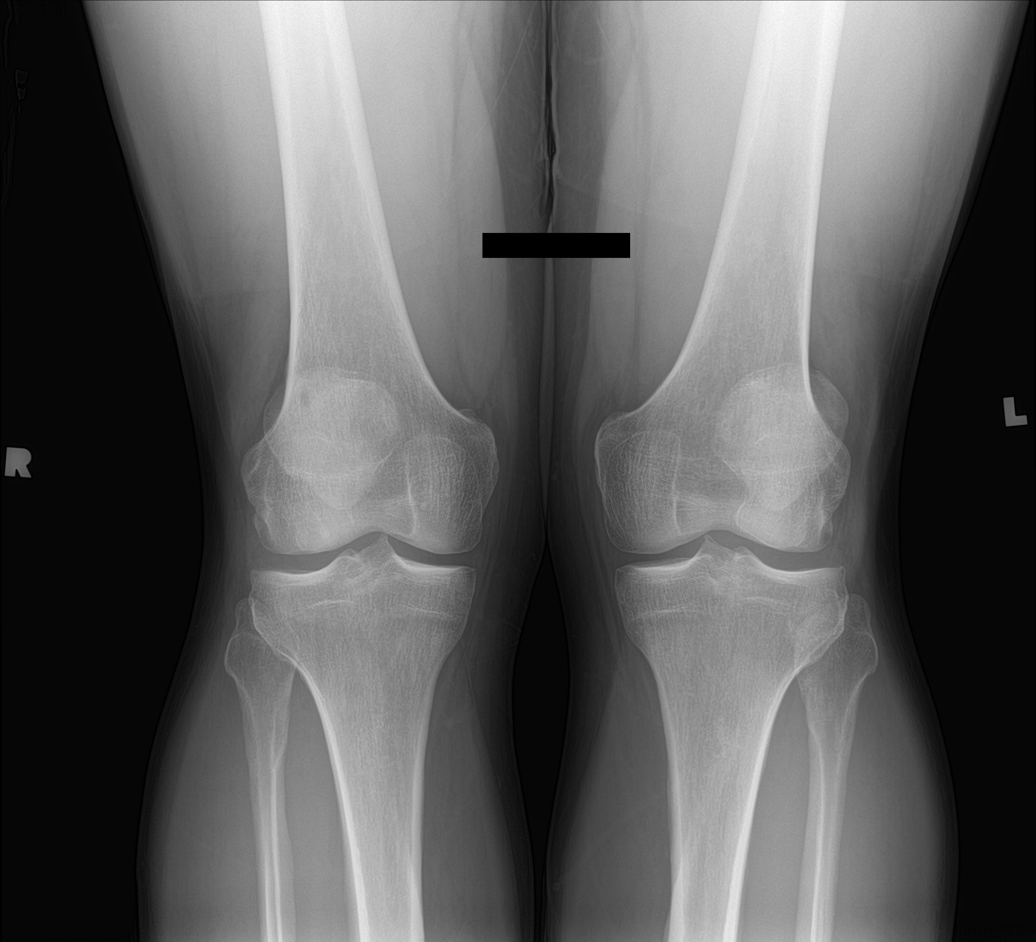

[knee sunrise standing]
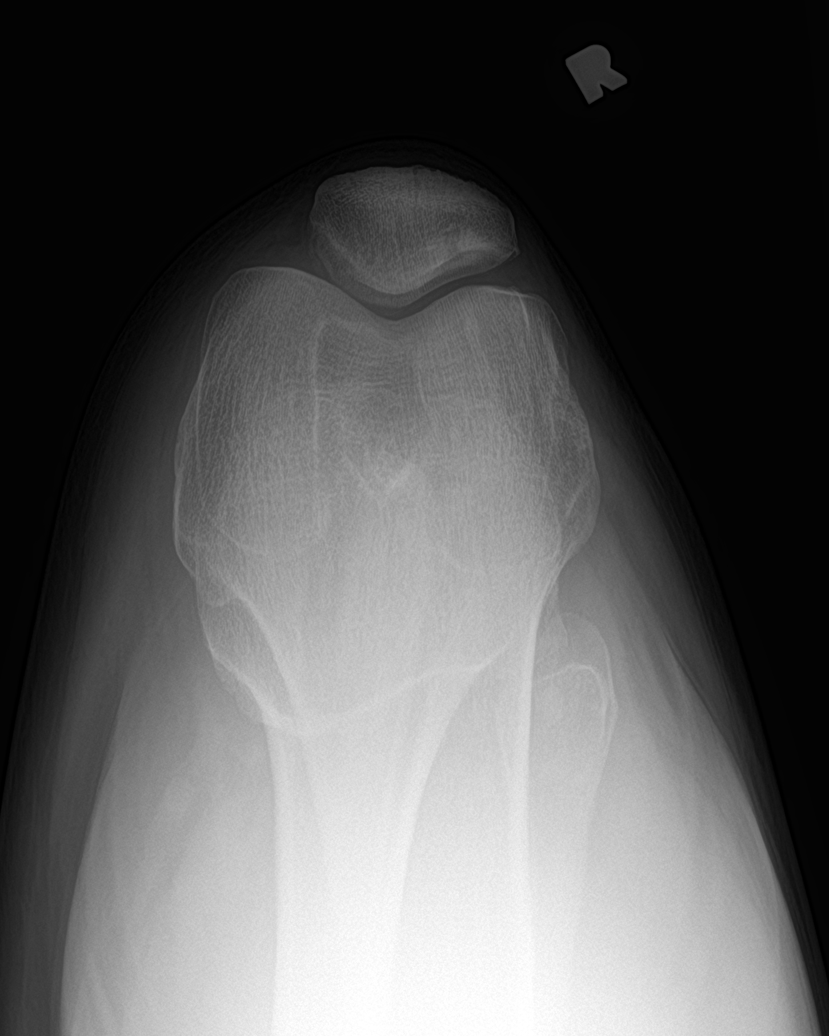

[knee tunnel]
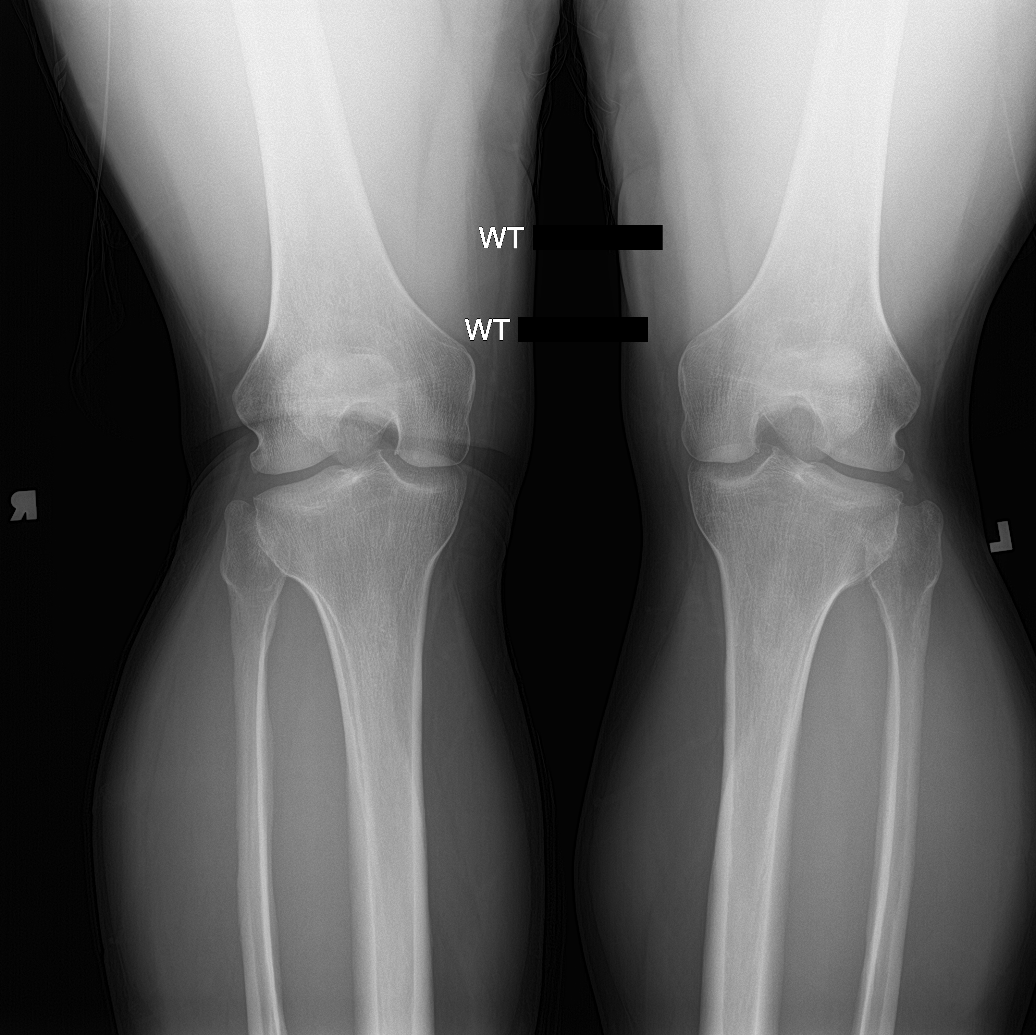

[4 of 4 positions shown; findings below may reference images not displayed]

FINDINGS: Right knee: No evidence of fracture, dislocation, or joint effusion.
No evidence of arthropathy or other focal bone abnormality. Soft
tissues are unremarkable.

Left knee: No evidence of fracture, dislocation, or joint effusion.
No evidence of arthropathy or other focal bone abnormality. Soft
tissues are unremarkable.
IMPRESSION: Negative.

## 2021-04-03 IMAGING — DX DG HIP (WITH OR WITHOUT PELVIS) 2-3V*L*
3 series · 3 of 3 positions shown · non-contrast
Comparison: None.

CLINICAL DATA: Left hip pain.

EXAM:
DG HIP (WITH OR WITHOUT PELVIS) 2-3V LEFT

[pelvis ap]
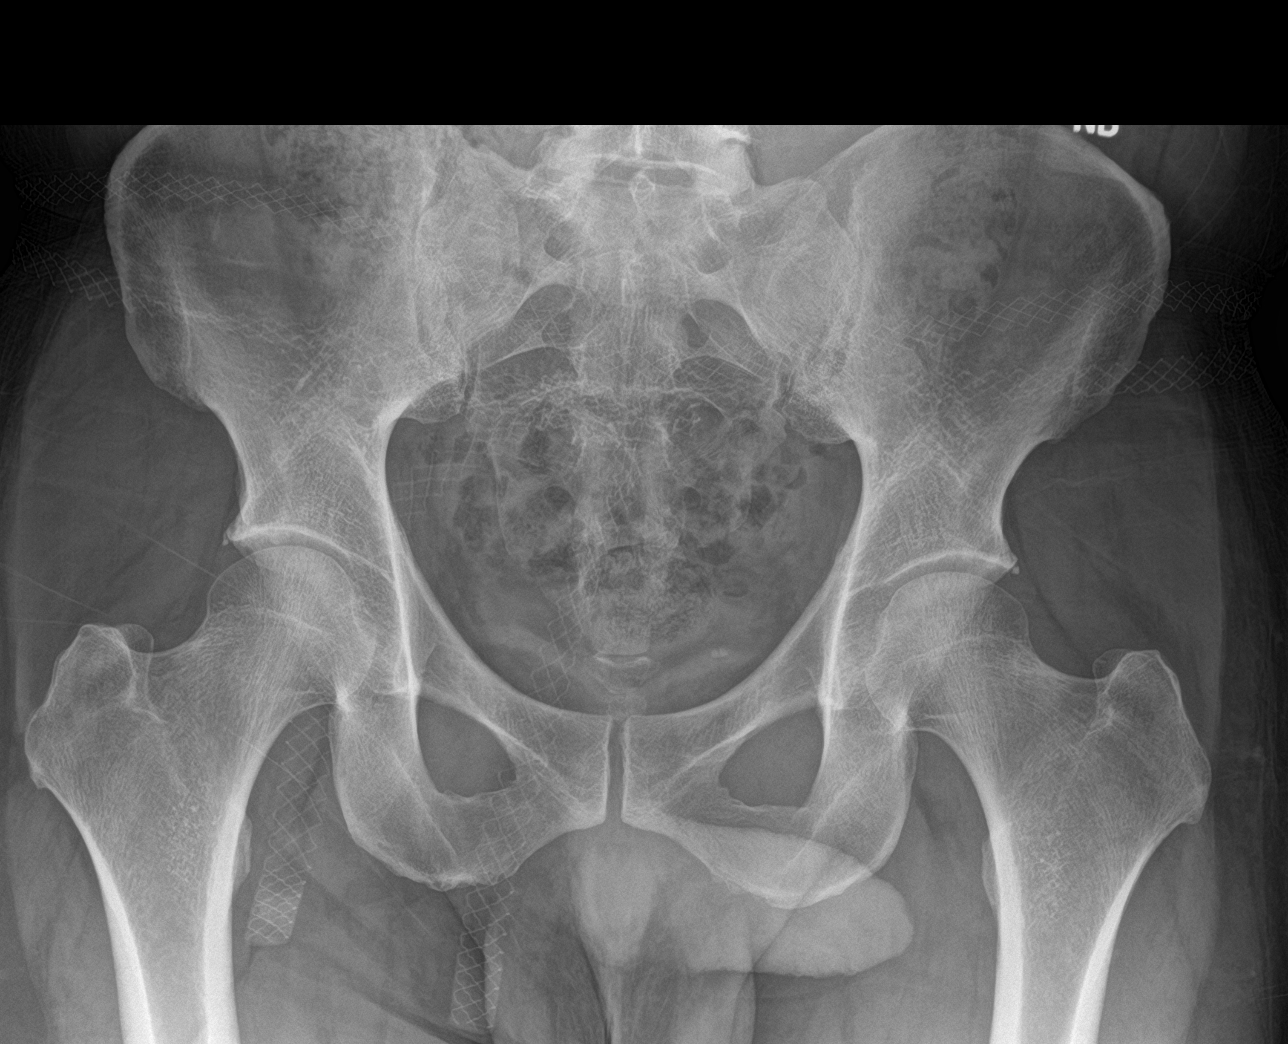

[hip ap]
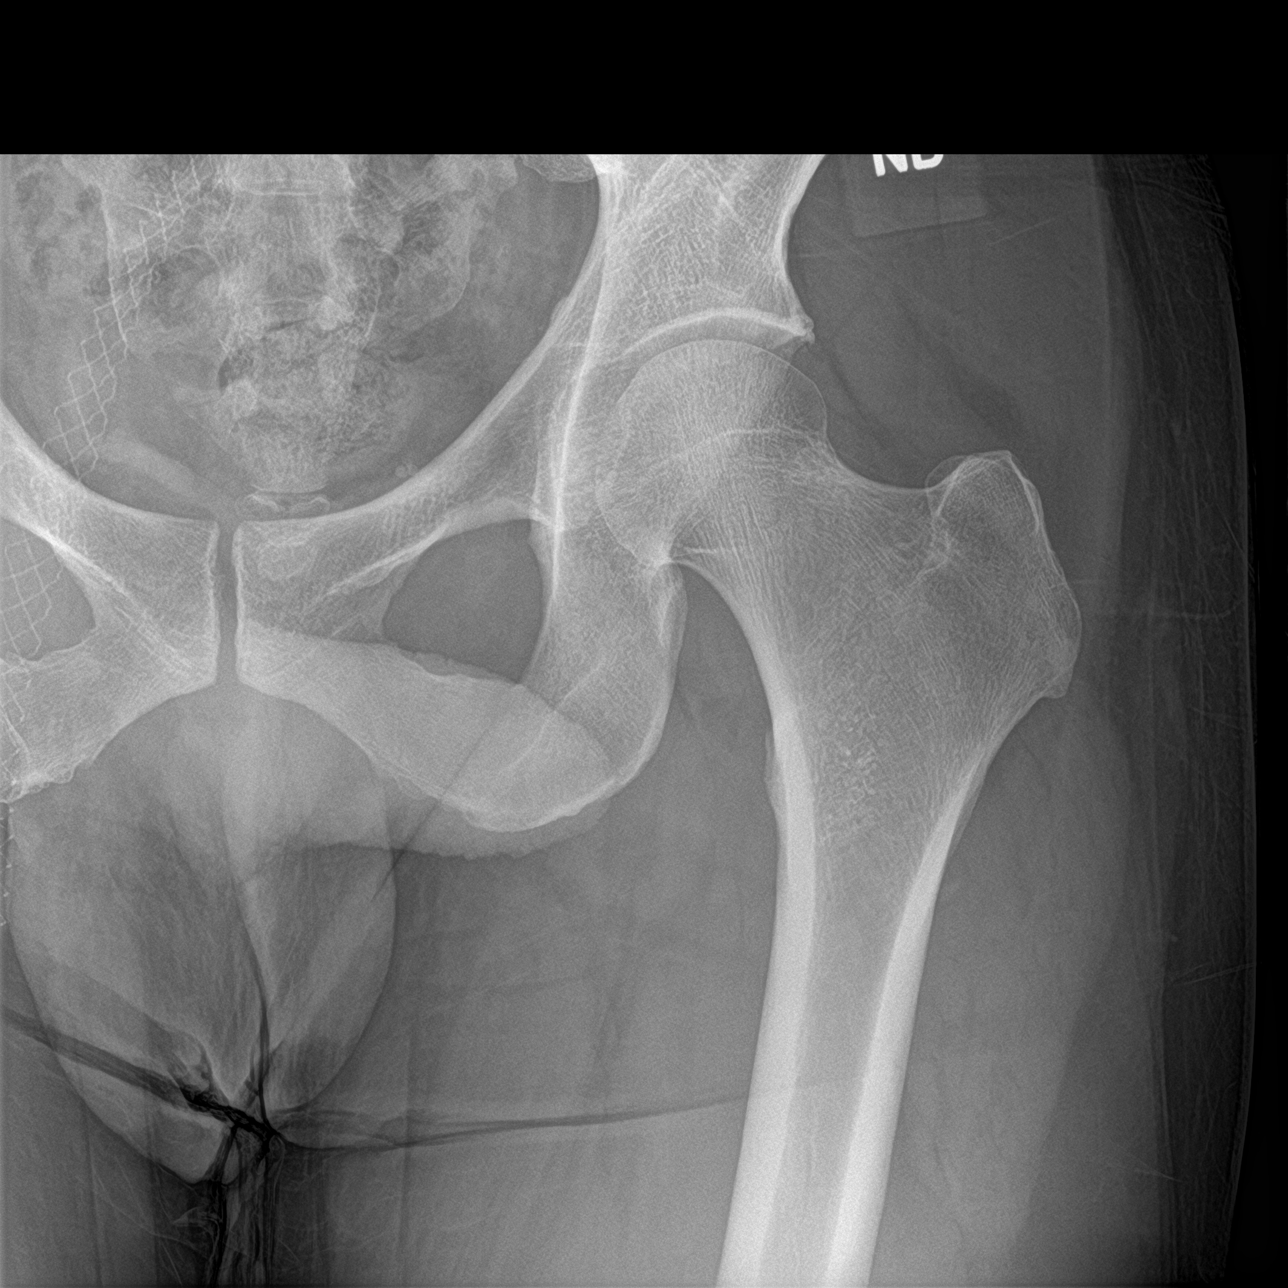

[hip lat]
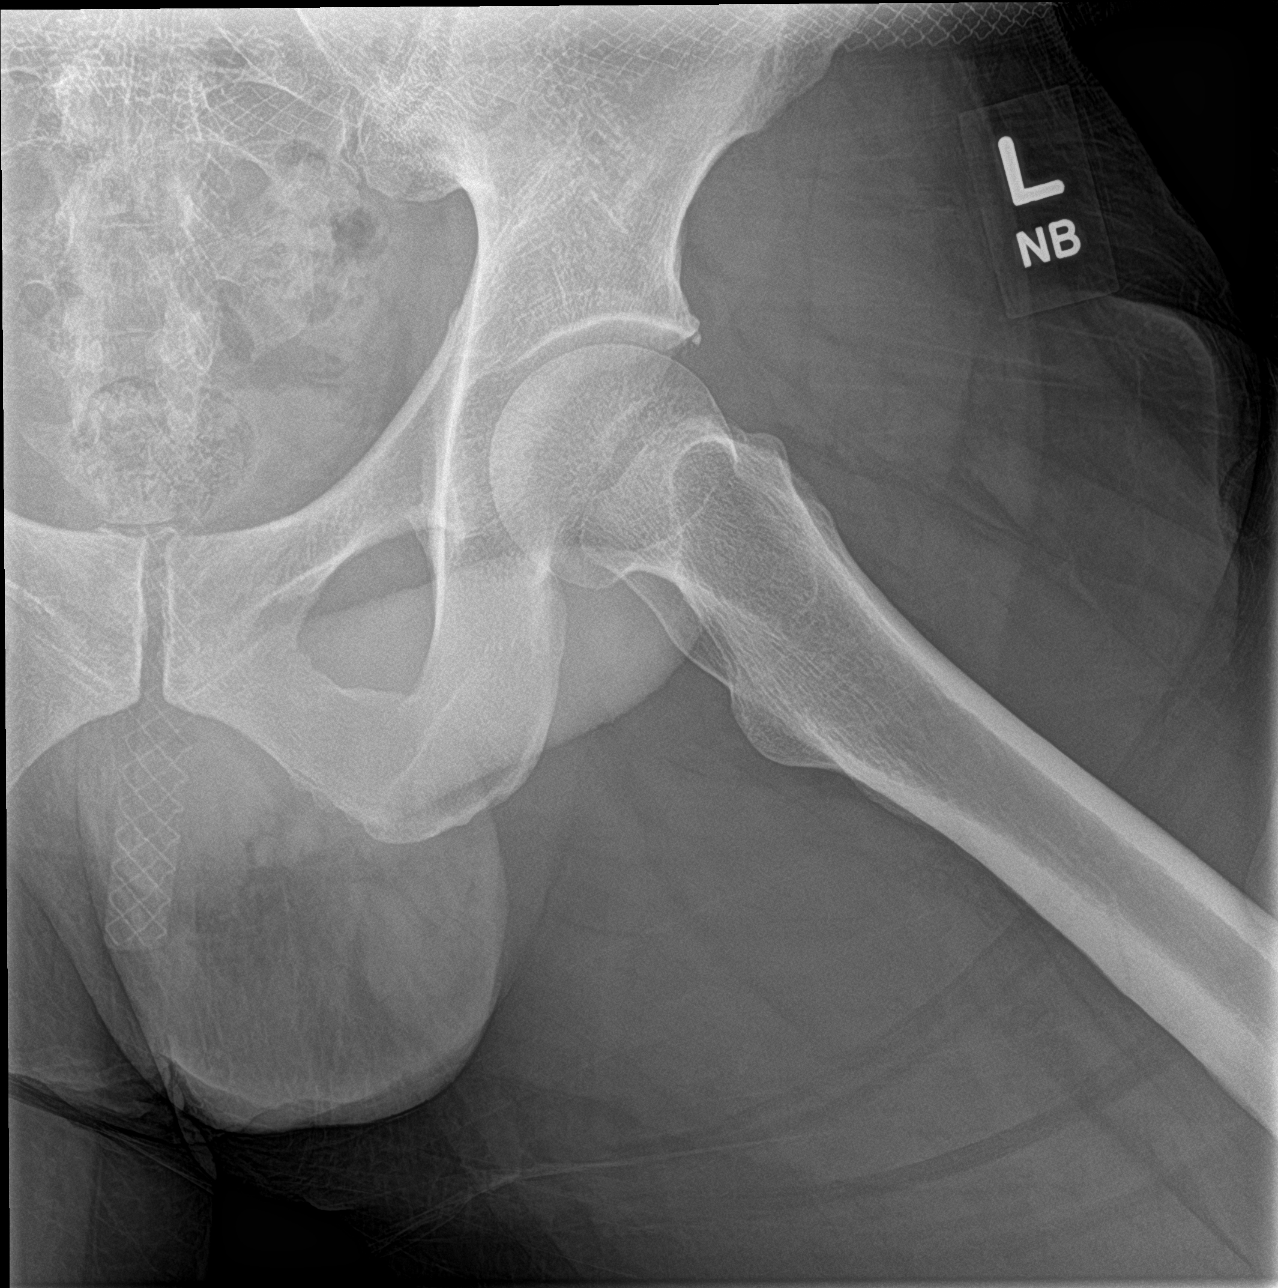

[3 of 3 positions shown; findings below may reference images not displayed]

FINDINGS: There is no evidence of hip fracture or dislocation. There is no
evidence of arthropathy or other focal bone abnormality.
IMPRESSION: Negative.

## 2021-04-03 IMAGING — DX DG LUMBAR SPINE COMPLETE 4+V
6 series · 6 of 6 positions shown · non-contrast
Comparison: None.

CLINICAL DATA: Low back and right lower extremity pain for several
months. No known injury.

EXAM:
LUMBAR SPINE - COMPLETE 4+ VIEW

[l-spine ap]
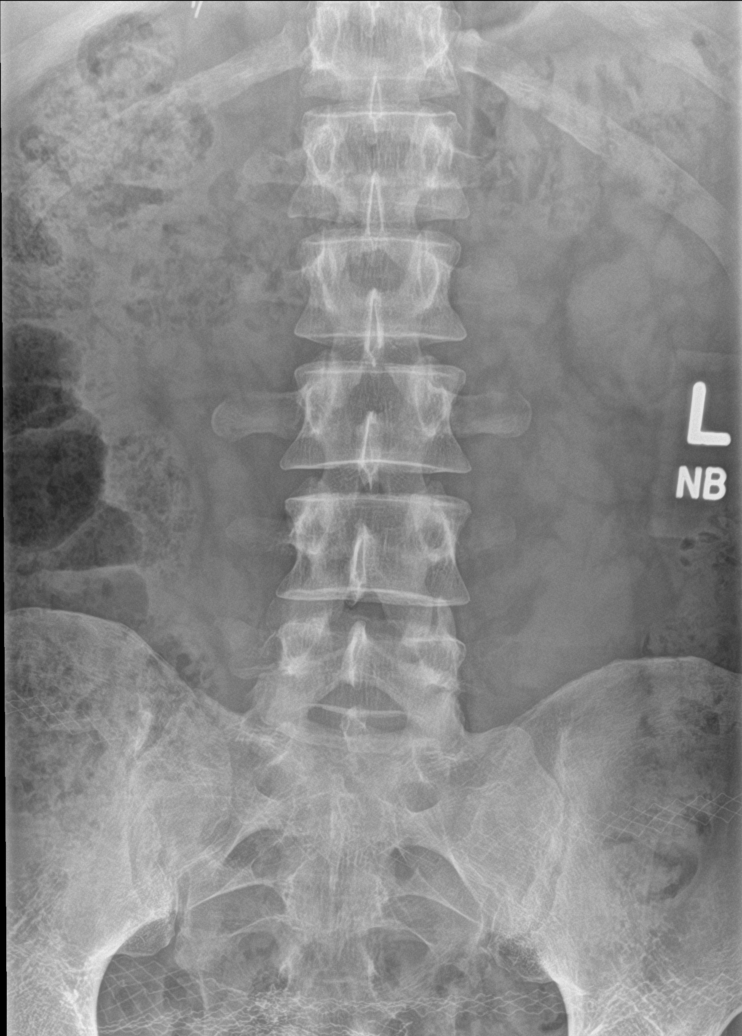

[l-spine obl (1 of 2)]
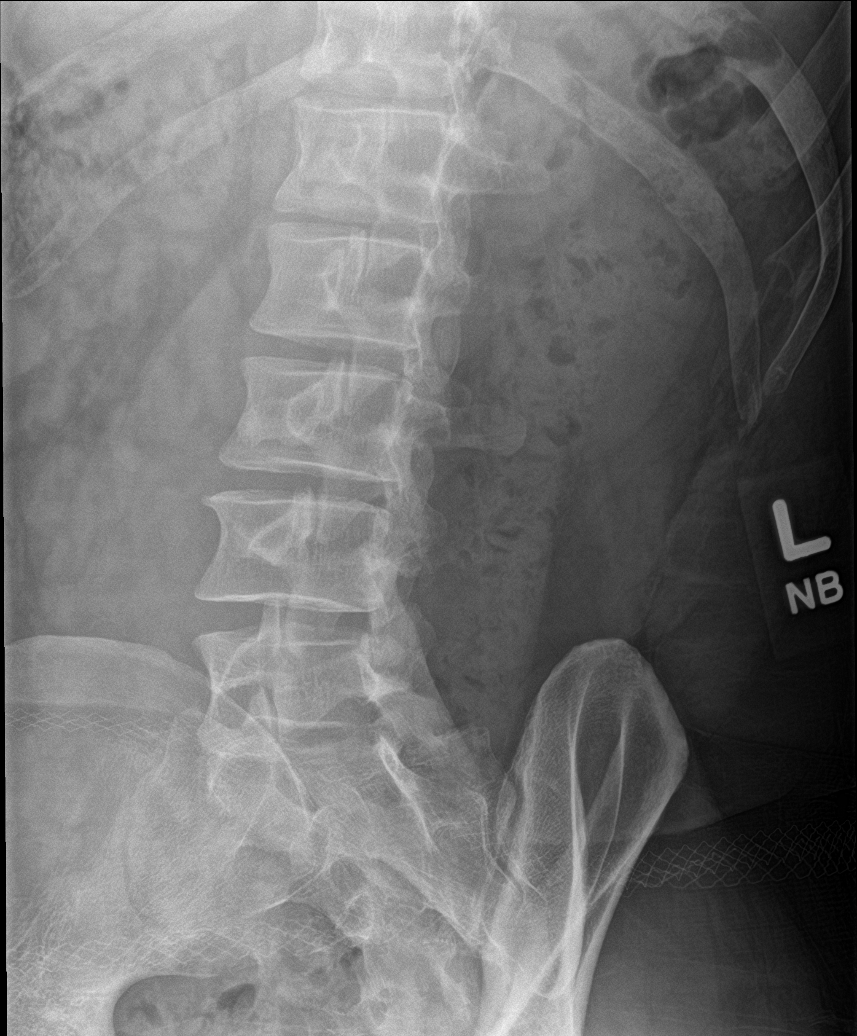

[l-spine obl (2 of 2)]
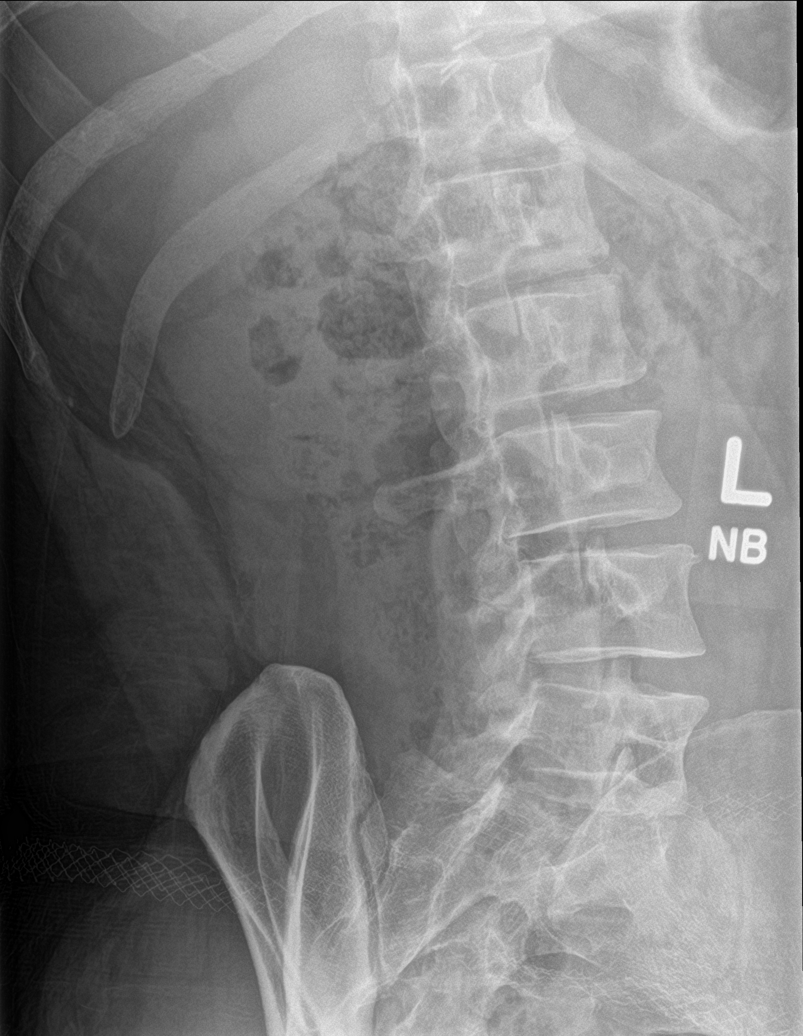

[l-spine lat]
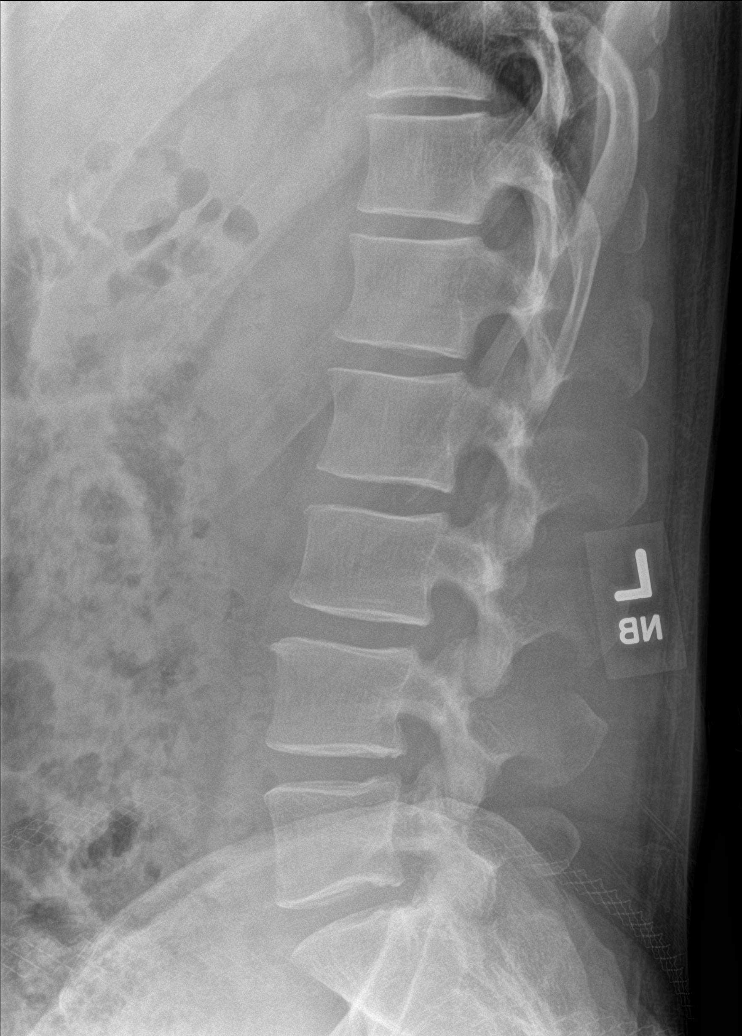

[l-spine spot (1 of 2)]
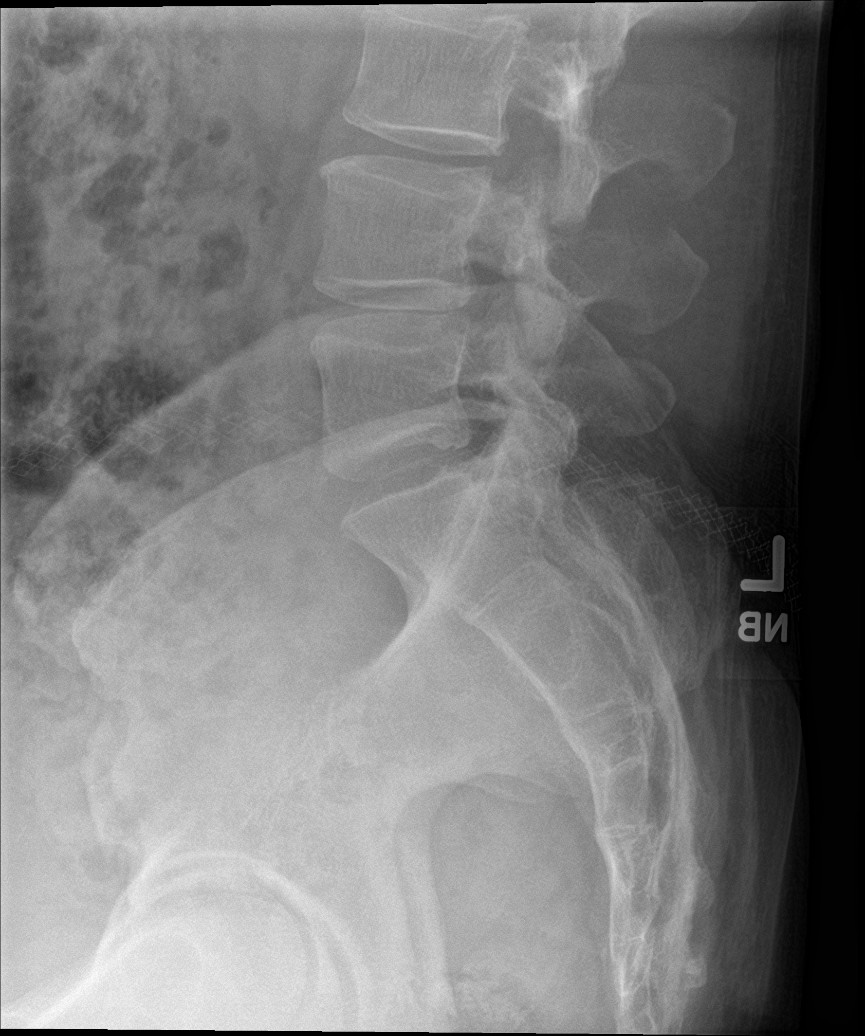

[l-spine spot (2 of 2)]
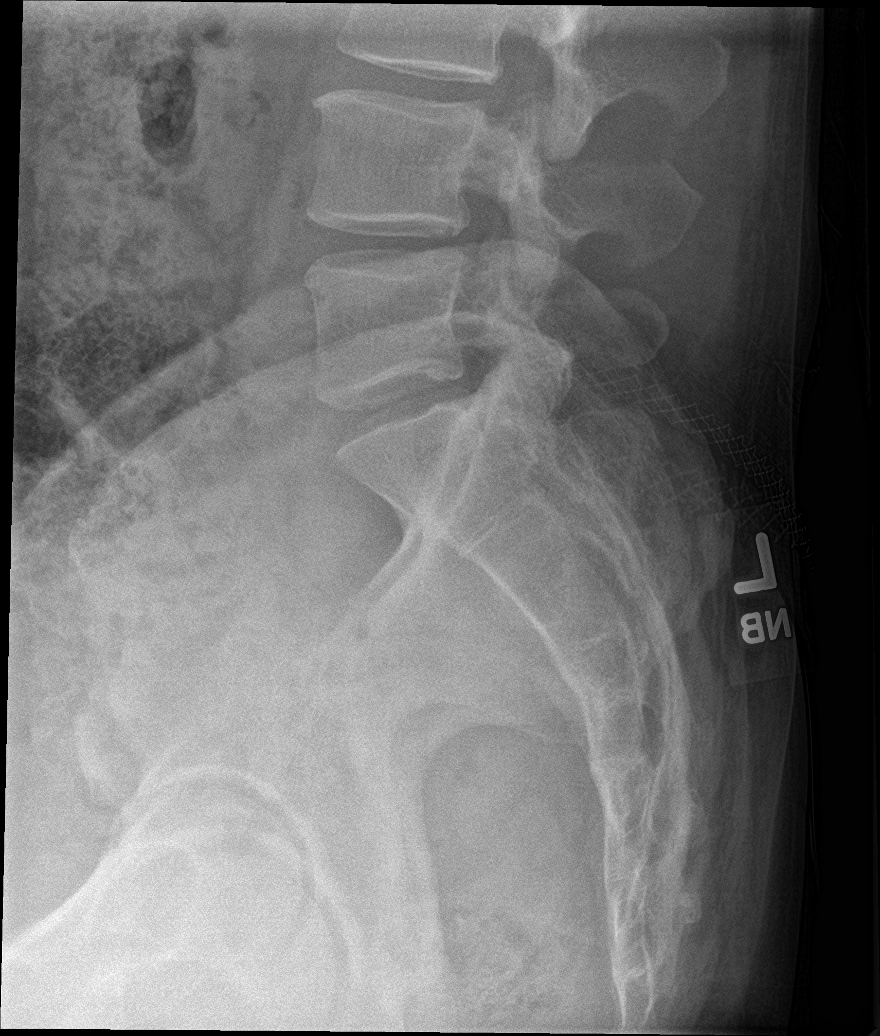

[6 of 6 positions shown; findings below may reference images not displayed]

FINDINGS: There is no evidence of lumbar spine fracture. Alignment is normal.
Intervertebral disc spaces are maintained.
IMPRESSION: Negative.

## 2021-04-03 MED ORDER — MELOXICAM 15 MG PO TABS
ORAL_TABLET | ORAL | 3 refills | Status: DC
Start: 2021-04-03 — End: 2021-07-12

## 2021-04-03 MED ORDER — PREDNISONE 50 MG PO TABS: ORAL_TABLET | ORAL | 0 refills | Status: DC

## 2021-04-03 NOTE — Assessment & Plan Note (Signed)
Chronic left hip pain with popping, exam is for the most part benign. Adding some hip conditioning for suspected internal snapping hip at the iliopsoas. X-rays. Return to see me in 6 weeks for this.

## 2021-04-03 NOTE — Assessment & Plan Note (Signed)
Alan Soto has also had chronic low back pain, radiation down the back of the right leg to the outer 3 toes on the right foot. Suspect lumbar radiculitis. Adding x-rays, 5 days of prednisone followed by meloxicam for Home conditioning, return to see me in 6 weeks, MRI for interventional planning if no better.

## 2021-04-03 NOTE — Assessment & Plan Note (Signed)
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 Concern is for chronic ACL disruption, adding x-rays and an MRI.

## 2021-04-03 NOTE — Progress Notes (Signed)
° ° °  Procedures performed today:    None.  Independent interpretation of notes and tests performed by another provider:   None.  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 Concern is for chronic ACL disruption, adding x-rays and an MRI.  Lumbar degenerative disc disease Braydin has also had chronic low back pain, radiation down the back of the right leg to the outer 3 toes on the right foot. Suspect lumbar radiculitis. Adding x-rays, 5 days of prednisone followed by meloxicam for Home conditioning, return to see me in 6 weeks, MRI for interventional planning if no better.  Strain of right calf muscle He also recalls doing exercise and feeling a pop in the back of his right calf musculotendinous junction. He does have some tenderness at the musculotendinous junction of the medial head of the gastrocnemius. We strapped his right calf with a compressive dressing and added bilateral heel lifts. Return to see me in 4 to 6 weeks for this.  Left hip pain Chronic left hip pain with popping, exam is for the most part benign. Adding some hip conditioning for suspected internal snapping hip at the iliopsoas. X-rays. Return to see me in 6 weeks for this.    ___________________________________________ Gwen Her. Dianah Field, M.D., ABFM., CAQSM. Primary Care and [place] Instructor of [employer] [facility] [employer]

## 2021-04-03 NOTE — Assessment & Plan Note (Signed)
He also recalls doing exercise and feeling a pop in the back of his right calf musculotendinous junction. He does have some tenderness at the musculotendinous junction of the medial head of the gastrocnemius. We strapped his right calf with a compressive dressing and added bilateral heel lifts. Return to see me in 4 to 6 weeks for this.

## 2021-04-10 ENCOUNTER — Other Ambulatory Visit: Payer: Self-pay

## 2021-04-10 ENCOUNTER — Ambulatory Visit (INDEPENDENT_AMBULATORY_CARE_PROVIDER_SITE_OTHER): Payer: Managed Care, Other (non HMO)

## 2021-04-10 DIAGNOSIS — G8929 Other chronic pain: Secondary | ICD-10-CM

## 2021-04-10 DIAGNOSIS — M25561 Pain in right knee: Secondary | ICD-10-CM | POA: Diagnosis not present

## 2021-04-10 IMAGING — MR MR KNEE*R* W/O CM
7 series · 40 of 40 positions shown · non-contrast
Comparison: X-ray [DATE].

CLINICAL DATA: Right anteromedial knee pain x 1 year. Clinical
concern for internal derangement.

EXAM:
MRI OF THE RIGHT KNEE WITHOUT CONTRAST
TECHNIQUE: Multiplanar, multisequence MR imaging of the knee was performed. No
intravenous contrast was administered.

[Series 3: T2 fat-sat · axial · 4.0mm · 0.62mm/px · z∈[-71,+83]mm · 6 of 32 slices shown (1 of 3)]
[im 1/32]
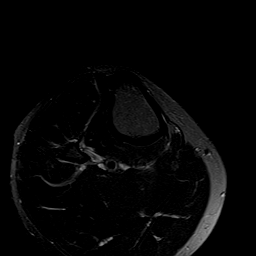
[im 7/32]
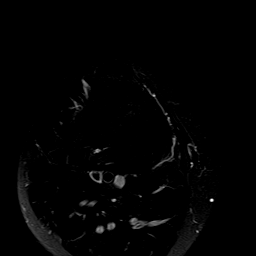
[im 13/32]
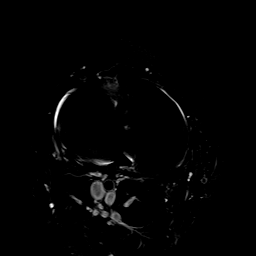
[im 19/32]
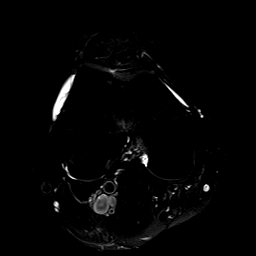
[im 25/32]
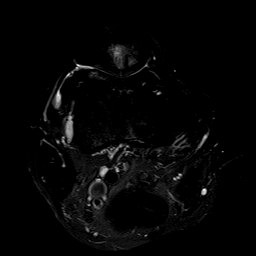
[im 32/32]
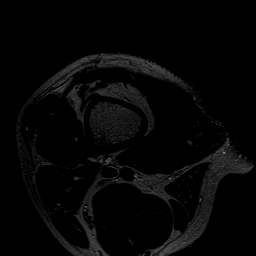

[Series 4: T1 · coronal · 4.0mm · 0.59mm/px · 6 of 28 slices shown]
[im 1/28]
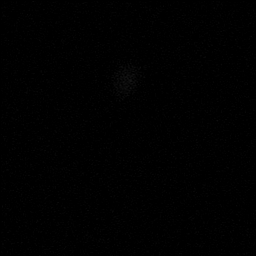
[im 6/28]
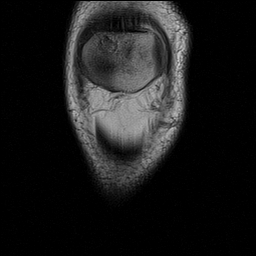
[im 11/28]
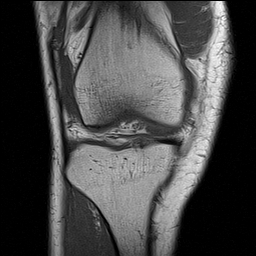
[im 17/28]
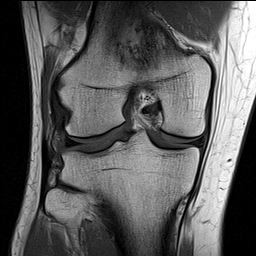
[im 22/28]
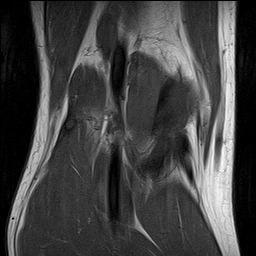
[im 28/28]
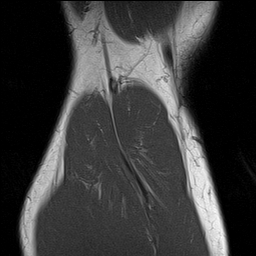

[Series 5: T2 fat-sat · coronal · 4.0mm · 0.59mm/px · 6 of 28 slices shown (2 of 3)]
[im 1/28]
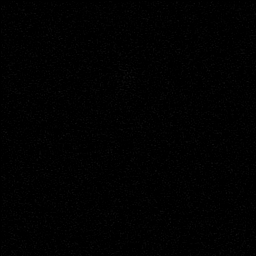
[im 6/28]
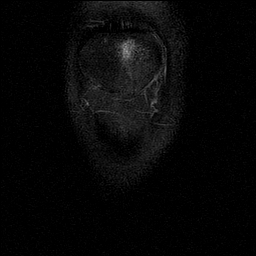
[im 11/28]
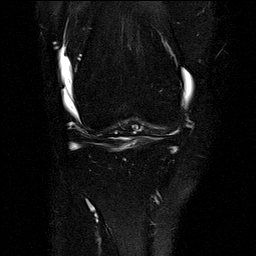
[im 17/28]
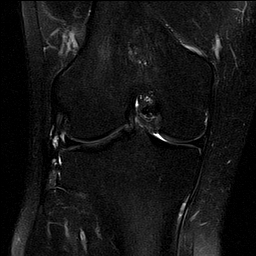
[im 22/28]
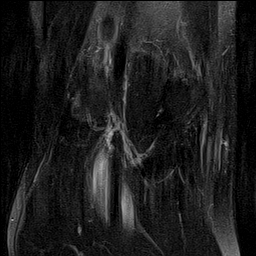
[im 28/28]
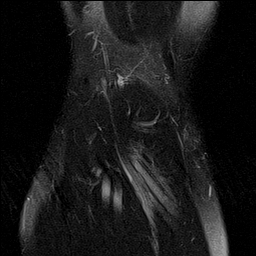

[Series 6: PD fat-sat · coronal · 4.0mm · 0.59mm/px · 6 of 28 slices shown (1 of 3)]
[im 1/28]
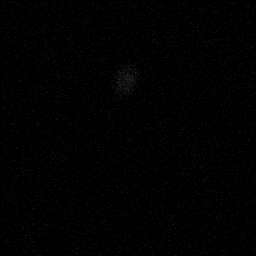
[im 6/28]
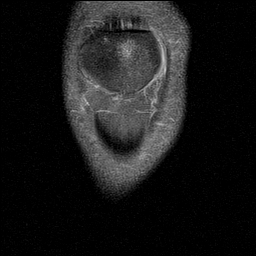
[im 11/28]
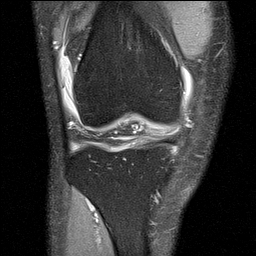
[im 17/28]
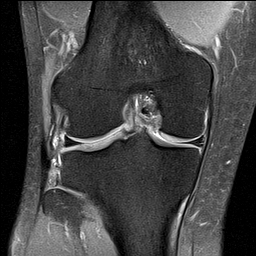
[im 22/28]
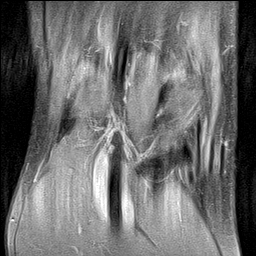
[im 28/28]
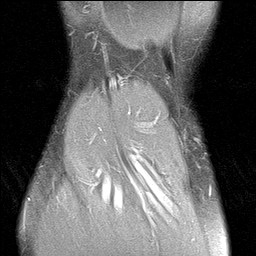

[Series 7: PD fat-sat · sagittal · 3.0mm · 0.59mm/px · 6 of 32 slices shown (2 of 3)]
[im 1/32]
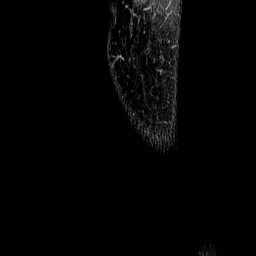
[im 7/32]
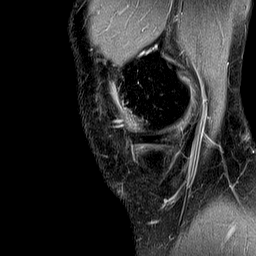
[im 13/32]
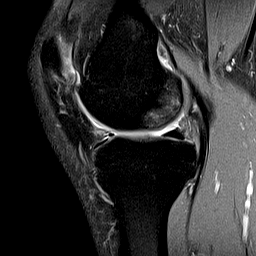
[im 19/32]
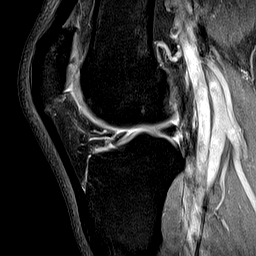
[im 25/32]
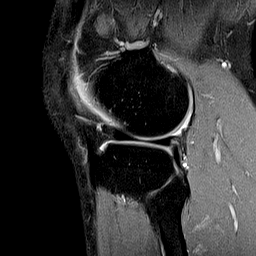
[im 32/32]
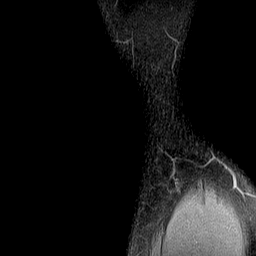

[Series 8: T2 fat-sat · sagittal · 3.0mm · 0.59mm/px · 6 of 32 slices shown (3 of 3)]
[im 1/32]
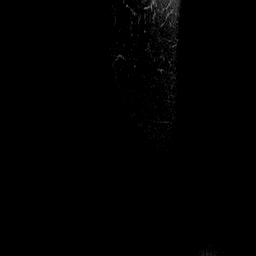
[im 7/32]
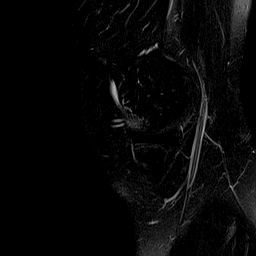
[im 13/32]
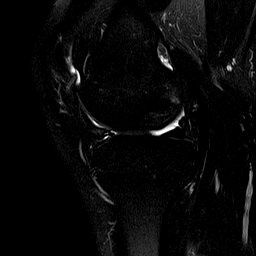
[im 19/32]
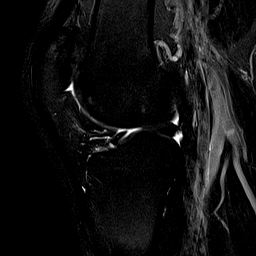
[im 25/32]
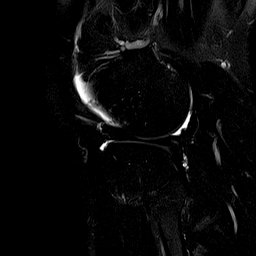
[im 32/32]
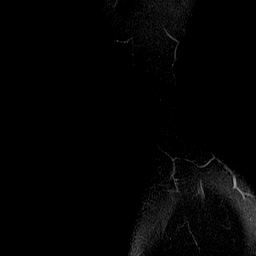

[Series 9: PD fat-sat · coronal · 2.0mm · 0.59mm/px · 4 of 22 slices shown (3 of 3)]
[im 1/22]
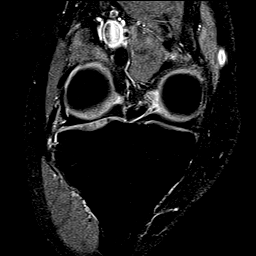
[im 8/22]
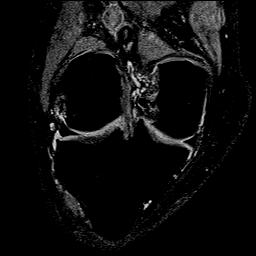
[im 15/22]
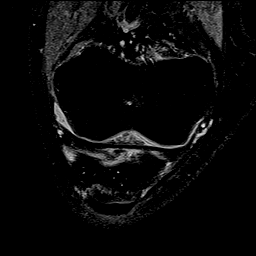
[im 22/22]
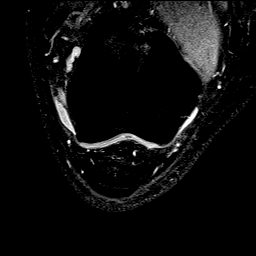

[40 of 40 positions shown; findings below may reference images not displayed]

FINDINGS: MENISCI

Medial: Intact.

Lateral: Intact.

LIGAMENTS

Cruciates: ACL and PCL are intact.

Collaterals: Medial collateral ligament is intact. Lateral
collateral ligament complex is intact.

CARTILAGE

Patellofemoral: Cartilage fissuring of the medial patellar facet and
patellar apex with subchondral reactive marrow edema.

Medial:  No chondral defect.

Lateral:  No chondral defect.

JOINT: No joint effusion. Normal PETRULIENE. Prominent medial
patellar plica.

POPLITEAL FOSSA: Popliteus tendon is intact. No Baker's cyst.

EXTENSOR MECHANISM: Intact quadriceps tendon. Intact patellar
tendon. Intact lateral patellar retinaculum. Intact medial patellar
retinaculum. Intact MPFL.

BONES: No aggressive osseous lesion. No fracture or dislocation.

Other: No fluid collection or hematoma. Muscles are normal.
IMPRESSION: 1. Cartilage fissuring of the medial patellar facet and patellar
apex with subchondral reactive marrow edema.
2. No meniscal or ligamentous injury of the right knee.

## 2021-05-15 ENCOUNTER — Ambulatory Visit: Payer: Managed Care, Other (non HMO) | Admitting: Sports Medicine

## 2021-05-15 ENCOUNTER — Other Ambulatory Visit: Payer: Self-pay

## 2021-05-15 ENCOUNTER — Ambulatory Visit (INDEPENDENT_AMBULATORY_CARE_PROVIDER_SITE_OTHER): Payer: Managed Care, Other (non HMO)

## 2021-05-15 DIAGNOSIS — G8929 Other chronic pain: Secondary | ICD-10-CM | POA: Diagnosis not present

## 2021-05-15 DIAGNOSIS — M25552 Pain in left hip: Secondary | ICD-10-CM | POA: Diagnosis not present

## 2021-05-15 DIAGNOSIS — J302 Other seasonal allergic rhinitis: Secondary | ICD-10-CM

## 2021-05-15 DIAGNOSIS — M25561 Pain in right knee: Secondary | ICD-10-CM

## 2021-05-15 DIAGNOSIS — J4521 Mild intermittent asthma with (acute) exacerbation: Secondary | ICD-10-CM

## 2021-05-15 MED ORDER — AZELASTINE HCL 0.1 % NA SOLN: 2.0000 | Freq: Two times a day (BID) | NASAL | 12 refills | Status: DC

## 2021-05-15 MED ORDER — PROAIR HFA 108 (90 BASE) MCG/ACT IN AERS: 1.0000 | INHALATION_SPRAY | Freq: Four times a day (QID) | RESPIRATORY_TRACT | 5 refills | Status: DC | PRN

## 2021-05-15 MED ORDER — LISDEXAMFETAMINE DIMESYLATE 40 MG PO CAPS: 40.0000 mg | ORAL_CAPSULE | ORAL | 0 refills | Status: DC

## 2021-05-15 NOTE — Assessment & Plan Note (Signed)
History of left shoulder labral tear, persistent left hip pain now with popping. ?He does have a positive FADIR sign, pain with resisted hip flexion. ?I did suspect iliopsoas snapping. ?There is certainly a concern for hip labral injury, as this would be more common. ?He has at this point failed greater than 6 weeks of physician directed conservative treatment, x-rays were unrevealing, proceed with MR arthrogram of the left hip. ?Return to see me for arthrogram injection. ?

## 2021-05-15 NOTE — Telephone Encounter (Signed)
Patient came in for appt with Dr. Darene Lamer today and requested refills on [name], Astelin and Proair inhaler. Orders pended  ? ?Thank you  ?

## 2021-05-15 NOTE — Assessment & Plan Note (Signed)
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 ?He failed conservative treatment. ?Due to persistent pain we injected his knee today, return to see me in a month for this. ?

## 2021-05-15 NOTE — Progress Notes (Signed)
? ? ?  Procedures performed today:   ? ?Procedure: Real-time Ultrasound Guided injection of the right knee ?Device: Samsung HS60  ?Verbal informed consent obtained.  ?Time-out conducted.  ?Noted no overlying erythema, induration, or other signs of local infection.  ?Skin prepped in a sterile fashion.  ?Local anesthesia: Topical Ethyl chloride.  ?With sterile technique and under real time ultrasound guidance: Trace effusion noted, 1 cc Kenalog 40, 2 cc lidocaine, 2 cc bupivacaine injected easily ?Completed without difficulty  ?Advised to call if fevers/chills, erythema, induration, drainage, or persistent bleeding.  ?Images permanently stored and available for review in PACS.  ?Impression: Technically successful ultrasound guided injection. ? ?Independent interpretation of notes and tests performed by another provider:  ? ?None. ? ?Brief History, Exam, Impression, and Recommendations:   ? 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 ?He failed conservative treatment. ?Due to persistent pain we injected his knee today, return to see me in a month for this. ? ?Left hip pain ?History of left shoulder labral tear, persistent left hip pain now with popping. ?He does have a positive FADIR sign, pain with resisted hip flexion. ?I did suspect iliopsoas snapping. ?There is certainly a concern for hip labral injury, as this would be more common. ?He has at this point failed greater than 6 weeks of physician directed conservative treatment, x-rays were unrevealing, proceed with MR arthrogram of the left hip. ?Return to see me for arthrogram injection. ? ? ? ?___________________________________________ ?Alan Soto, M.D., ABFM., CAQSM. ?Primary Care and Sports Medicine ?[place] ? ?Adjunct Instructor of Family Medicine  ?[employer] ?

## 2021-05-25 ENCOUNTER — Telehealth: Payer: Self-pay

## 2021-05-25 NOTE — Telephone Encounter (Signed)
Prior auth for MRI denied by insurance. Per insurance: ? ?Imaging requires six weeks of provider directed treatment to be completed. Supported treatments include (but limited to) drugs for swelling or pain, an in office workout (physical therapy), and /or oral or injected steroids. This must have been completed in the past three months without improved symptoms. Contact (via office visit, phone, email or messaging) must occur after treatment is completed. This has not been met because: ? ?You have not completed 6 weeks of provider directed treatment ?The provider directed treatment did not occur within the last three months ?Symptoms must be the same or worse after treatment to support imaging ?There was no contact with your provider after completing treatment.  ? ?Provider can complete peer to peer with [employer] at [phone]. Ref #[id number] ?

## 2021-05-29 NOTE — Telephone Encounter (Signed)
Approved. Number: [id number].  Exp: 11/12/21. ? ?Please get him on MRI schedule and my schedule for arthrogram injection. ?

## 2021-05-30 NOTE — Telephone Encounter (Signed)
Task completed. Referral information has been updated. Imaging dept has notified to contact patient for scheduling. Patient has been scheduled for his injection with provider.  ?

## 2021-06-05 ENCOUNTER — Ambulatory Visit: Payer: Managed Care, Other (non HMO) | Admitting: Sports Medicine

## 2021-06-05 ENCOUNTER — Ambulatory Visit (INDEPENDENT_AMBULATORY_CARE_PROVIDER_SITE_OTHER): Payer: Managed Care, Other (non HMO)

## 2021-06-05 DIAGNOSIS — M25552 Pain in left hip: Secondary | ICD-10-CM | POA: Diagnosis not present

## 2021-06-05 IMAGING — MR MR HIP*L* W/CM
5 of 6 series · 31 of 40 positions shown · IV contrast (gadavist)
Comparison: Radiographs dated [DATE]

CLINICAL DATA: Left hip pain, labral tear suspected.

EXAM:
MRI  OF THE LEFT HIP WITH INTRA ARTICULAR CONTRAST
TECHNIQUE: Multiplanar, multisequence MR imaging was performed following the
administration of intra-articular contrast. Please see ultrasound
guided arthrogram for further details
CONTRAST:  1mL GADAVIST GADOBUTROL 1 MMOL/ML IV SOLN

[Series 4: T1 · coronal · 4.0mm · 0.70mm/px · 8 of 36 slices shown]
[im 1/36]
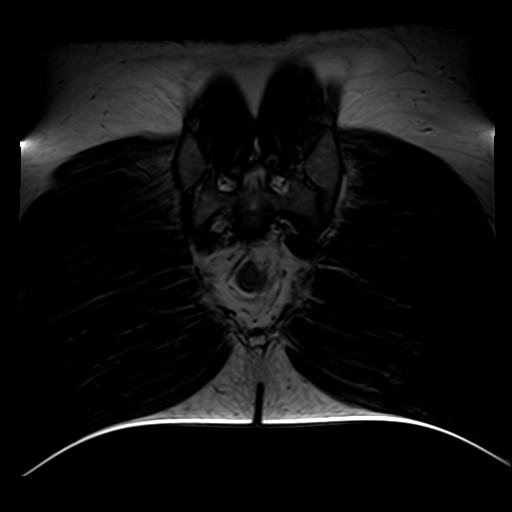
[im 6/36]
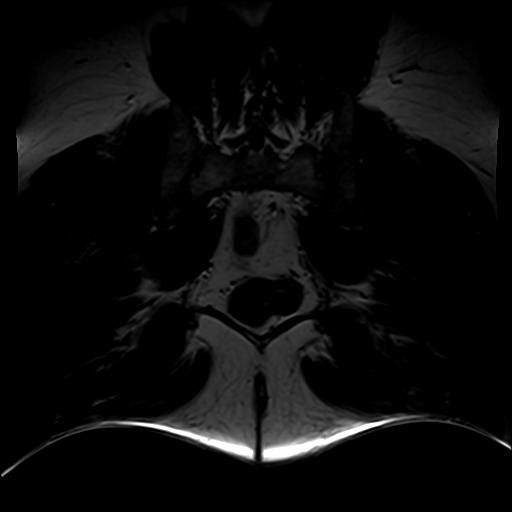
[im 11/36]
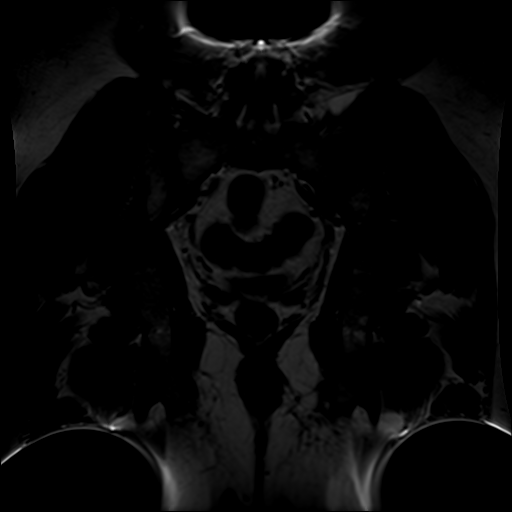
[im 16/36]
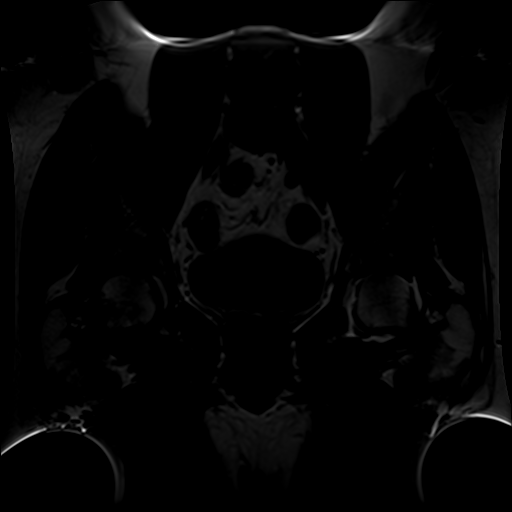
[im 21/36]
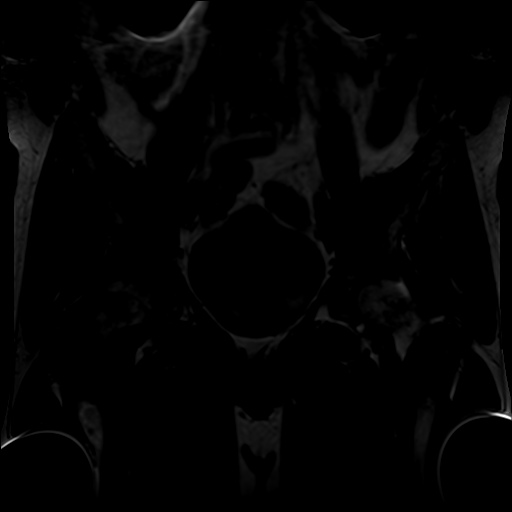
[im 26/36]
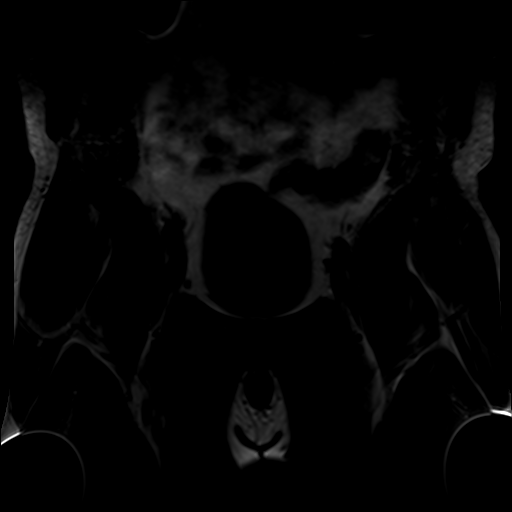
[im 31/36]
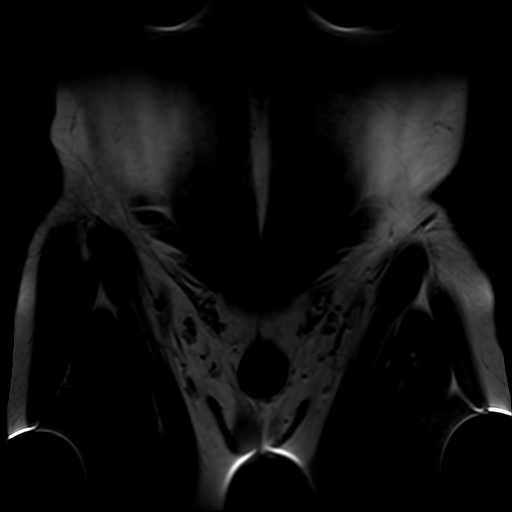
[im 36/36]
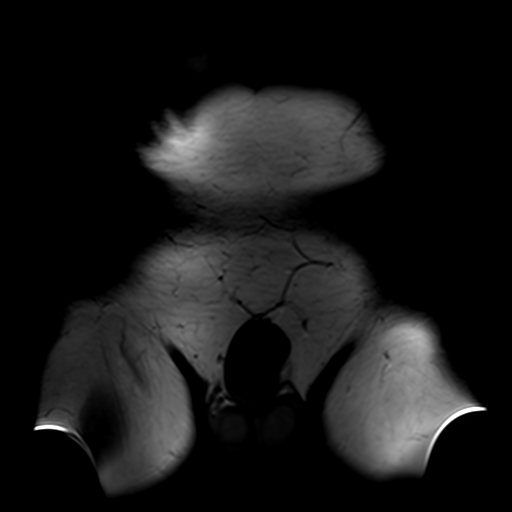

[Series 5: T2 fat-sat · axial · 3.0mm · 0.66mm/px · z∈[-70,+56]mm · 8 of 36 slices shown]
[im 1/36]
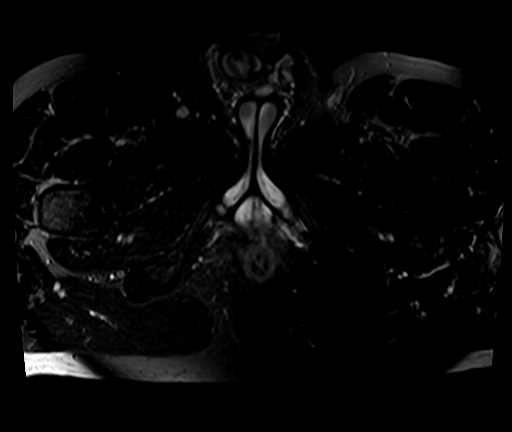
[im 6/36]
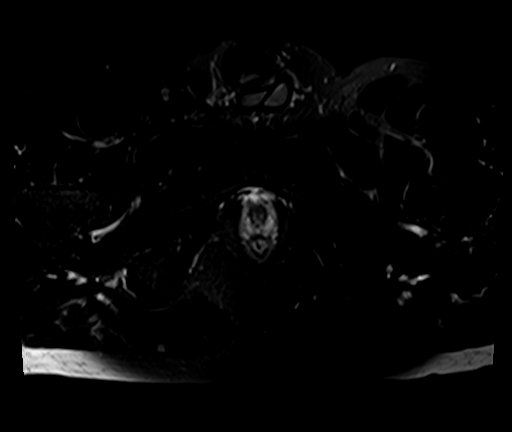
[im 11/36]
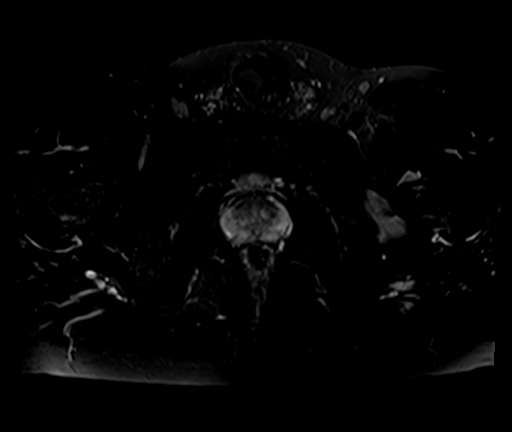
[im 16/36]
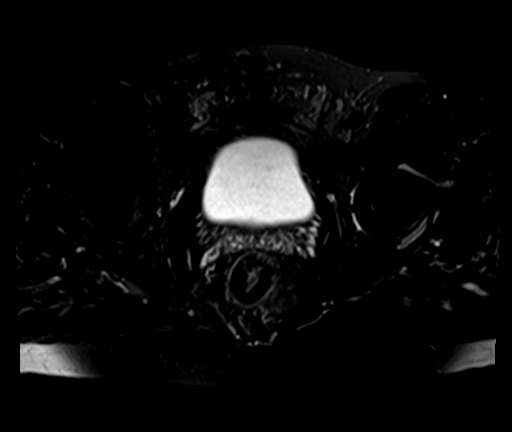
[im 21/36]
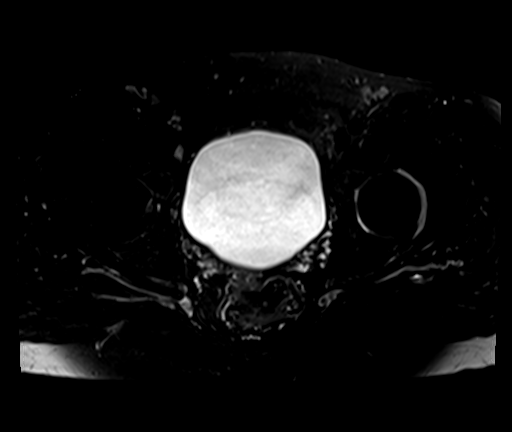
[im 26/36]
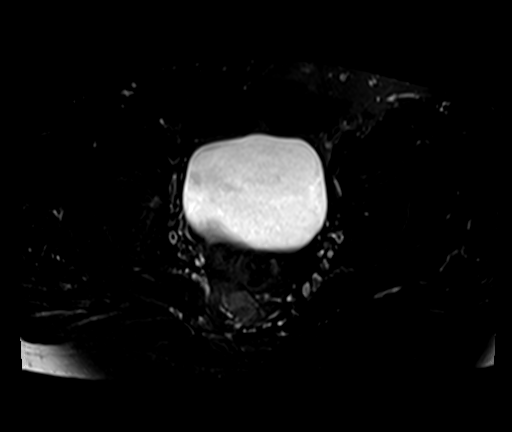
[im 31/36]
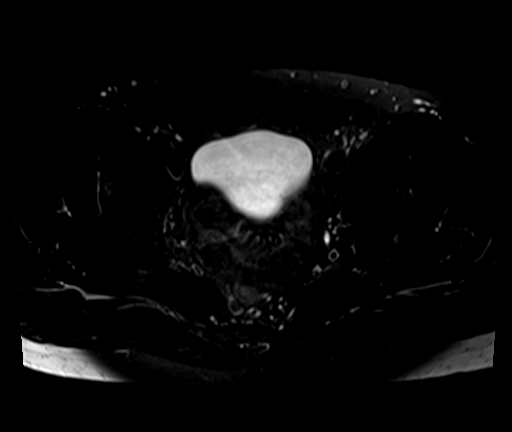
[im 36/36]
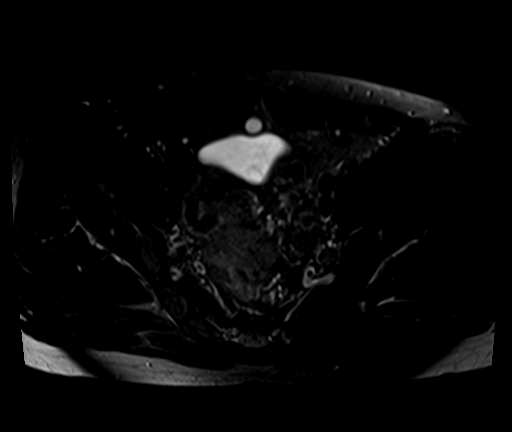

[Series 6: T1 fat-sat · axial · 4.0mm · 0.70mm/px · z∈[-26,+56]mm · 5 of 22 slices shown (1 of 3)]
[im 1/22]
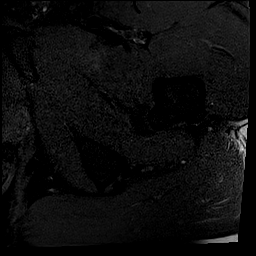
[im 6/22]
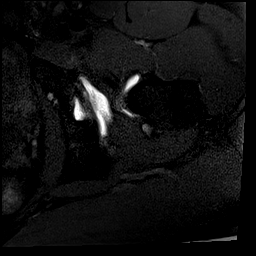
[im 11/22]
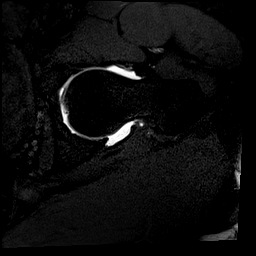
[im 16/22]
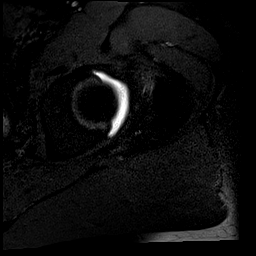
[im 22/22]
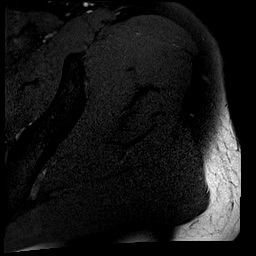

[Series 7: T1 fat-sat · coronal · 4.0mm · 0.70mm/px · 5 of 23 slices shown (2 of 3)]
[im 1/23]
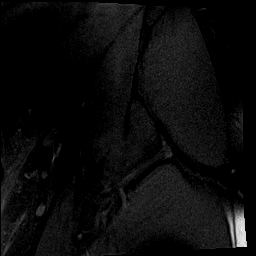
[im 6/23]
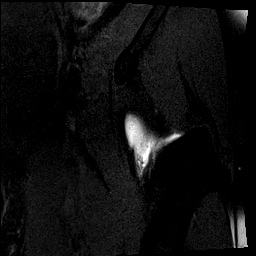
[im 12/23]
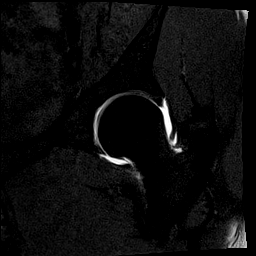
[im 17/23]
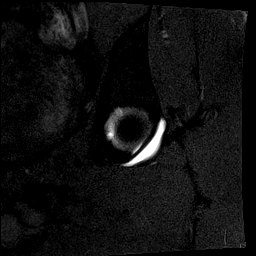
[im 23/23]
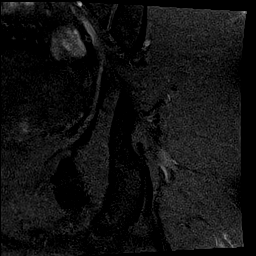

[Series 8: T1 fat-sat · sagittal · 4.0mm · 0.70mm/px · 5 of 28 slices shown (3 of 3)]
[im 1/28]
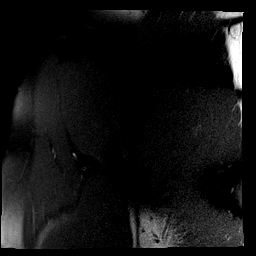
[im 6/28]
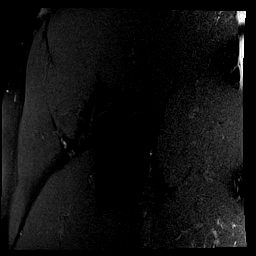
[im 11/28]
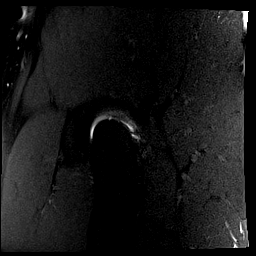
[im 17/28]
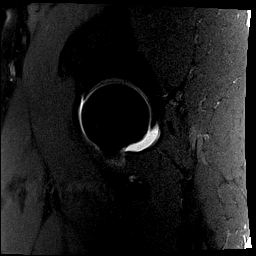
[im 22/28]
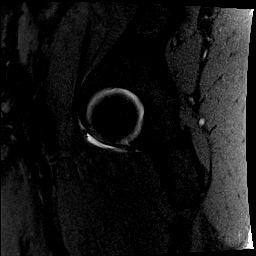

[31 of 40 positions shown; findings below may reference images not displayed]

FINDINGS: Bone

No hip fracture, dislocation or avascular necrosis. No aggressive
osseous lesion.

SI joints are normal. No SI joint widening or erosive changes.

Lower lumbar spine demonstrates no focal abnormality.

Alignment

Normal. No subluxation.

Joint effusion

Intraarticular contrast distends the left hip joint capsule.
Otherwise, no joint effusions.

Labrum

Small tear of the anteroinferior labrum.

Cartilage

No full-thickness cartilage defect.

Capsule and ligaments

Normal.

Muscles and Tendons

Flexors: Normal.

Extensors: Normal.

Abductors: Normal.

Adductors: Normal.

Rotators: Normal.

Hamstrings: Normal.

Other Findings

No bursal fluid.

Viscera

No abnormality seen in pelvis. No lymphadenopathy. No free fluid in
the pelvis.
IMPRESSION: 1.  Small anteroinferior labral tear.

2.  No full-thickness cartilage defect.

3.  No evidence of fracture or osteonecrosis.

4.  Muscles and tendons are within normal limits.

## 2021-06-05 MED ORDER — GADOBUTROL 1 MMOL/ML IV SOLN
1.0000 mL | Freq: Once | INTRAVENOUS | Status: AC | PRN
  Administered 2021-06-05: 1 mL via INTRAVENOUS

## 2021-06-05 NOTE — Progress Notes (Signed)
? ? ?  Procedures performed today:   ? ?Procedure: Real-time Ultrasound Guided gadolinium contrast injection of left hip joint ?Device: Samsung HS60  ?Verbal informed consent obtained.  ?Time-out conducted.  ?Noted no overlying erythema, induration, or other signs of local infection.  ?Skin prepped in a sterile fashion.  ?Local anesthesia: Topical Ethyl chloride.  ?With sterile technique and under real time ultrasound guidance: Noted slight cam deformity, 22-gauge spinal needle advanced to the femoral head/neck junction, we guided the needle just cranial to an overlying arterial structure, I then injected 1 cc kenalog 40, 2 cc lidocaine, 2 cc bupivacaine, syringe again switched and 0.1 cc gadolinium injected, syringe again switched and 10 cc sterile saline used to fully distend the joint. ?Joint visualized and capsule seen distending confirming intra-articular placement of contrast material and medication. ?Completed without difficulty  ?Advised to call if fevers/chills, erythema, induration, drainage, or persistent bleeding.  ?Images permanently stored in PACS ?Impression: Technically successful ultrasound guided gadolinium contrast injection for MR arthrography.  Please see separate MR arthrogram report. ? ?Independent interpretation of notes and tests performed by another provider:  ? ?None. ? ?Brief History, Exam, Impression, and Recommendations:   ? ?Left hip pain ?Please see prior notes, MR arthrogram injection performed today. ?Karsten was also mentioning a popping sensation posterior/lateral left knee not referable to the IT band. ?We will further investigate this once we get a good hold on his hip. ? ? ? ?___________________________________________ ?Gwen Her. Dianah Field, M.D., ABFM., CAQSM. ?Primary Care and Sports Medicine ?[place] ? ?Adjunct Instructor of Family Medicine  ?[employer] ?

## 2021-06-05 NOTE — Assessment & Plan Note (Signed)
Please see prior notes, MR arthrogram injection performed today. ?Dakarri was also mentioning a popping sensation posterior/lateral left knee not referable to the IT band. ?We will further investigate this once we get a good hold on his hip. ?

## 2021-06-12 ENCOUNTER — Ambulatory Visit: Payer: Managed Care, Other (non HMO) | Admitting: Sports Medicine

## 2021-06-12 ENCOUNTER — Telehealth: Payer: Self-pay | Admitting: Sports Medicine

## 2021-06-12 DIAGNOSIS — M7632 Iliotibial band syndrome, left leg: Secondary | ICD-10-CM | POA: Diagnosis not present

## 2021-06-12 DIAGNOSIS — M25561 Pain in right knee: Secondary | ICD-10-CM | POA: Diagnosis not present

## 2021-06-12 DIAGNOSIS — S73192D Other sprain of left hip, subsequent encounter: Secondary | ICD-10-CM

## 2021-06-12 DIAGNOSIS — G8929 Other chronic pain: Secondary | ICD-10-CM | POA: Diagnosis not present

## 2021-06-12 NOTE — Assessment & Plan Note (Signed)
Alan Soto also has chronic left hip pain with a positive FADIR sign, ultimately we obtained a hip arthrogram that showed a small hip labral tear, the steroid in the arthrogram injection did not seem to help his pain, we will go ahead and get him set up with hip arthroscopy. ?He has a small rash from the topical cooling spray. ?

## 2021-06-12 NOTE — Assessment & Plan Note (Addendum)
Alan Soto is also complaining of a popping sensation lateral/posterior left knee with squatting. ?This is really biceps femoris popping over his fibular head, not so much IT band syndrome. ?We did discuss aggressive hamstring proprioceptive neuromuscular facilitation stretching, eccentric conditioning, we will watch this for now. ?He does have a 30 mile hike coming up.  He will be taking pictures of wild horses. ?

## 2021-06-12 NOTE — Assessment & Plan Note (Signed)
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 ?He had a year of pain in his right knee, ultimately we obtained an MRI that showed some patellofemoral cartilage fissuring and subchondral edema. ?We injected his knee and he had short-term relief, still has some pain but it is tolerable, he will bump up his arthritis from Tylenol to twice daily. ?Considering arthritic changes on imaging we will also proceed with viscosupplementation and a second opinion from one of our orthopedic surgeons. ?

## 2021-06-12 NOTE — Progress Notes (Signed)
? ? ?  Procedures performed today:   ? ?None. ? ?Independent interpretation of notes and tests performed by another provider:  ? ?None. ? ?Brief History, Exam, Impression, and Recommendations:   ? 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 ?He had a year of pain in his right knee, ultimately we obtained an MRI that showed some patellofemoral cartilage fissuring and subchondral edema. ?We injected his knee and he had short-term relief, still has some pain but it is tolerable, he will bump up his arthritis from Tylenol to twice daily. ?Considering arthritic changes on imaging we will also proceed with viscosupplementation and a second opinion from one of our orthopedic surgeons. ? ?Labral tear of hip joint ?Conan also has chronic left hip pain with a positive FADIR sign, ultimately we obtained a hip arthrogram that showed a small hip labral tear, the steroid in the arthrogram injection did not seem to help his pain, we will go ahead and get him set up with hip arthroscopy. ?He has a small rash from the topical cooling spray. ? ?It band syndrome, left ?Andrik is also complaining of a popping sensation lateral/posterior left knee with squatting. ?This is really biceps femoris popping over his fibular head, not so much IT band syndrome. ?We did discuss aggressive hamstring proprioceptive neuromuscular facilitation stretching, eccentric conditioning, we will watch this for now. ?He does have a 30 mile hike coming up.  He will be taking pictures of wild horses. ? ? ? ?___________________________________________ ?Gwen Her. Dianah Field, M.D., ABFM., CAQSM. ?Primary Care and Sports Medicine ?[place] ? ?Adjunct Instructor of Family Medicine  ?[employer] ?

## 2021-06-12 NOTE — Telephone Encounter (Signed)
Orthovisc approval please, right knee, x-ray and MRI confirmed osteoarthritis, patellofemoral.  Has been through injections, analgesics, therapy. ?

## 2021-06-12 NOTE — Patient Instructions (Signed)
Look into static, ballistic, and PNF stretching. ?Eccentric conditioning for the hamstring. ?

## 2021-06-15 ENCOUNTER — Ambulatory Visit (HOSPITAL_BASED_OUTPATIENT_CLINIC_OR_DEPARTMENT_OTHER): Payer: Managed Care, Other (non HMO) | Admitting: Orthopaedic Surgery

## 2021-06-15 NOTE — Telephone Encounter (Signed)
[employer] paperwork faxed to [employer] at [phone] ?Request is for Orthovisc or Monovisc ?Pt's insurance prefers Euflexxa ?Fax confirmation receipt received  ?

## 2021-06-16 NOTE — Telephone Encounter (Signed)
Benefits Investigation Details received from [employer] ?Injection: Euflexxa ? ?Medical: Deductible applies. Since deductible is met, patient is responsible for product copay. Once OOP is met, patient is covered at 100%. Only one copay applies per date of service. Prior authorization is required for the drug through [place]. Please fax the provided form to [phone]. ? ?PA required: Yes ?PA form and medical records faxed to [facility] at [phone]  ?Fax confirmation received ? ?Pharmacy: This product is not covered under the pharmacy plan.  ? ?Specialty Pharmacy: [facility] ? ?May fill through: Specialty Pharmacy ?OV Copay/Coinsurance:  ?Product Copay: $20 ?Administration Coinsurance:  ?Administration Copay:  ?Deductible: $300 (met: $300) ?Out of Pocket Max: $2000 (met: $591.43)   ?

## 2021-06-23 MED ORDER — EUFLEXXA 20 MG/2ML IX SOSY: 2.0000 mL | PREFILLED_SYRINGE | INTRA_ARTICULAR | 0 refills | Status: DC

## 2021-06-23 NOTE — Telephone Encounter (Signed)
Patient aware that specialty pharmacy will be in contact for copay and shipment verification. Patient aware that Rx has already been sent to the pharmacy. He is aware that we do not schedule until the meds are received in the office.  ?

## 2021-06-23 NOTE — Telephone Encounter (Signed)
PA determination received from [place]. Auth # [id number] goof from 06/23/21 through 07/28/21.  ?PA has been approved for Euflexxa ? ?Left msg for a return call from patient  ?Rx will be sent to [facility].  ?Rx has been tee'd up below and ready for review and approval/denial.    ?

## 2021-06-23 NOTE — Addendum Note (Signed)
Addended by: Dema Severin on: 06/23/2021 12:56 PM ? ? Modules accepted: Orders ? ?

## 2021-06-23 NOTE — Addendum Note (Signed)
Addended by: Silverio Decamp on: 06/23/2021 01:04 PM ? ? Modules accepted: Orders ? ?

## 2021-06-23 NOTE — Telephone Encounter (Signed)
Signed.

## 2021-06-28 ENCOUNTER — Ambulatory Visit (HOSPITAL_BASED_OUTPATIENT_CLINIC_OR_DEPARTMENT_OTHER): Payer: Managed Care, Other (non HMO) | Admitting: Orthopaedic Surgery

## 2021-06-28 DIAGNOSIS — M67959 Unspecified disorder of synovium and tendon, unspecified thigh: Secondary | ICD-10-CM | POA: Diagnosis not present

## 2021-06-28 NOTE — Progress Notes (Signed)
? ?                            ? ? ?Chief Complaint: Left hip, right knee pain ?  ? ? ?History of Present Illness:  ? ? ?Alan Soto is a 36 y.o. male presents today with ongoing left hip pain as well as right knee pain.  He has been both going on for several months now.  He is very active and enjoys running up to 3 to 5 miles daily although these are aggravating to him at the end of the longer run.  He has previously been seen by Dr. Dianah Field who has been managing both of these issues.  He is referred here today for further assessment.  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  He works in [employer] during the day.  The pain has been bothersome and preventing him from being as active as he would like.  He does also have a popping click in the left hip which she is hoping to have worked up.  He does have a history of weight fluctuation he notes with more recent weight gain that has been bothersome to him. ? ? ? ?Surgical History:   ?None ? ?PMH/PSH/Family History/Social History/Meds/Allergies:   ? ?Past Medical History:  ?Diagnosis Date  ? Asthma 02/27/1992  ? As per patient  ? Deviated septum 01/05/2016  ? Visit with ENT 01/12/2016: Turbinate reduction, septoplasty, bilateral maxillary antrostomy planned by ENT. 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  ? Esophageal reflux 06/22/2015  ? Status post EGD 06/03/2014. Small hiatal hernia, nonspecific gastritis   ? Essential hypertension 06/22/2015  ? History of ADHD 07/21/2015  ? Records reviewed from previous PCP, patient treated with [name], Wellbutrin, previously on dexmethylphenidate (Focalin)   ? History of cholecystectomy 06/22/2015  ? 81/4481 - complications from surgery, persistent N/V and abdominal pain, subsequent Endo/Colonoscopies fine, removal seems to have resulted in food intol/allerg   ? Hypertension   ? Labral tear of shoulder 07/21/2015  ?  Status post arthroscopy   ? 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 Status post sleep study people 21st 2016, recommendation with CPAP   ? S/P vasectomy 06/22/2015  ? Seasonal allergies 06/22/2015  ? Testosterone deficiency 06/22/2015  ? Previously followed by endocrine back in [place], see where they mention hypogonadism in to stop Depo (presumably Depo-testosterone) for 2 months and then reevaluate, not sure if patient ever followed up her these records just aren't available. Labs reviewed, there was 1 mention of abnormally low testosterone but other values had been normal. Labs done by myself were normal. Consider endocrine referral if patient wants to pursue this issue further   ? ?Past Surgical History:  ?Procedure Laterality Date  ? CHOLECYSTECTOMY  01/2015  ? SHOULDER SURGERY  11/2014  ? ?Social History  ? ?Socioeconomic History  ? Marital status: Divorced  ?  Spouse name: Not on file  ? Number of children: Not on file  ? Years of education: Not on file  ? Highest education level: Not on file  ?Occupational History  ? Not on file  ?Tobacco Use  ? Smoking status: Never  ? Smokeless tobacco: Never  ?Substance and Sexual Activity  ? Alcohol use: Not on file  ? Drug use: Not on file  ? Sexual activity: Not on file  ?Other Topics Concern  ? Not on file  ?Social  History Narrative  ? Not on file  ? ?Social Determinants of Health  ? ?Financial Resource Strain: Not on file  ?Food Insecurity: Not on file  ?Transportation Needs: Not on file  ?Physical Activity: Not on file  ?Stress: Not on file  ?Social Connections: Not on file  ? ?No family history on file. ?Allergies  ?Allergen Reactions  ? Penicillins Shortness Of Breath and Anaphylaxis  ? ?Current Outpatient Medications  ?Medication Sig Dispense Refill  ? azelastine (ASTELIN) 0.1 % nasal spray Place 2 sprays into both nostrils 2 (two) times daily. 30 mL 12  ? famotidine (PEPCID) 10 MG tablet Take 1 tablet (10 mg total) by mouth 2 (two) times daily. 90 tablet 3  ? Ferrous Sulfate  (IRON PO) Take 1 tablet by mouth daily.    ? lisdexamfetamine ([name]) 40 MG capsule Take 1 capsule (40 mg total) by mouth every morning. Fill in 30 days 30 capsule 0  ? lisdexamfetamine ([name]) 40 MG capsule Take 1 capsule (40 mg total) by mouth every morning. Fill now 30 capsule 0  ? meloxicam (MOBIC) 15 MG tablet One tab PO qAM with a meal for 2 weeks, then daily prn pain. 30 tablet 3  ? PROAIR HFA 108 (90 Base) MCG/ACT inhaler Inhale 1-2 puffs into the lungs every 6 (six) hours as needed for wheezing or shortness of breath. 18 g 5  ? Sodium Hyaluronate (EUFLEXXA) 20 MG/2ML SOSY Inject 2 mLs into the articular space once a week. 6 mL 0  ? SUMAtriptan (IMITREX) 20 MG/ACT nasal spray Place 1 spray (20 mg total) into the nose every 2 (two) hours as needed for migraine or headache (Do not exceed 2 doses in 24 hour period). May repeat in 2 hours if headache persists or recurs. 3 each 3  ? SUMAtriptan (IMITREX) 50 MG tablet Take 1 tablet (50 mg total) by mouth every 2 (two) hours as needed for migraine. Repeat in 2 hours if persists. MAX 3 tablets in 24 hours. 30 tablet 3  ? ?No current facility-administered medications for this visit.  ? ?No results found. ? ?Review of Systems:   ?A ROS was performed including pertinent positives and negatives as documented in the HPI. ? ?Physical Exam :   ?Constitutional: NAD and appears stated age ?Neurological: Alert and oriented ?Psych: Appropriate affect and cooperative ?There were no vitals taken for this visit.  ? ?Comprehensive Musculoskeletal Exam:   ? ?Inspection Right Left  ?Skin No atrophy or gross abnormalities appreciated No atrophy or gross abnormalities appreciated  ?Palpation    ?Tenderness None None  ?Crepitus None None  ?Range of Motion    ?Flexion (passive) 120 120  ?Extension 30 30  ?IR 30 30  ?ER 45 45  ?Strength    ?Flexion  5/5 5/5  ?Extension 5/5 5/5  ?Special Tests    ?FABER Negative Negative  ?FADIR Negative Negative  ?ER Lag/Capsular Insufficiency  Negative Negative  ?Instability Negative Negative  ?Sacroiliac pain Negative  Negative   ?Instability    ?Generalized Laxity No No  ?Neurologic    ?sciatic, femoral, obturator nerves intact to light sensation  ?Vascular/Lymphatic    ?DP pulse 2+ 2+  ?Lumbar Exam    ?Patient has symmetric lumbar range of motion with negative pain referral to hip  ? ?He has tenderness about the greater trochanter with resisted abduction of the left hip.  Mildly positive Trendelenburg on the left.  He has popping about the psoas tendon with flexion about the left hip ? ?  Imaging:   ?Xray (3 views left hip AP with AP pelvis): ?Normal ? ?MRI (left hip, right knee): ?No pathology identified in the femoral acetabular joint.  There does appear to be tearing at the anterior margin of the gluteus medius insertion ? ?Right knee there is a large full-thickness chondral lesion predominantly involving the medial patellar facet as well as a kissing lesion involving the lateral trochlea ? ?I personally reviewed and interpreted the radiographs. ? ? ?Assessment:   ?36 y.o. male with left hip pain consistent with a small gluteus gluteus medius tear as well as internal snapping consistent with psoas snapping over the pelvic brim.  Ultimately I have advised an injection around the psoas tendon which I performed ultrasound-guided today.  I have also recommended an ultrasound-guided injection around the gluteus medius so that we can hopefully get a more clear diagnostic picture surrounding the root etiology of his symptoms.  That being said his exam is very consistent with gluteus medius tearing with significant weakness for his age and activity status.  To this effect I recommended physical therapy as well that we can get him a good gluteal strengthening program for the left side and I do believe will relieve the majority of his symptoms.  I will continue to follow him from this perspective to ensure that he continues to feel better with regard to the  left hip.  In terms of the right knee there is a full-thickness cartilage lesion behind the patella as well as lateral trochlea.  His symptoms are consistent with patellofemoral type pain on the right.  I have descr

## 2021-06-30 ENCOUNTER — Other Ambulatory Visit: Payer: Self-pay | Admitting: Family Medicine

## 2021-06-30 ENCOUNTER — Other Ambulatory Visit: Payer: Self-pay

## 2021-06-30 ENCOUNTER — Encounter: Payer: Self-pay | Admitting: Family Medicine

## 2021-06-30 MED ORDER — LISDEXAMFETAMINE DIMESYLATE 40 MG PO CAPS: 40.0000 mg | ORAL_CAPSULE | ORAL | 0 refills | Status: DC

## 2021-06-30 NOTE — Telephone Encounter (Signed)
Patient has been advised to schedule a follow up appointment. Please see MyChart message. ?

## 2021-07-11 ENCOUNTER — Telehealth: Payer: Self-pay | Admitting: Family Medicine

## 2021-07-11 ENCOUNTER — Other Ambulatory Visit: Payer: Self-pay | Admitting: Family Medicine

## 2021-07-11 MED ORDER — LISDEXAMFETAMINE DIMESYLATE 40 MG PO CAPS: 40.0000 mg | ORAL_CAPSULE | ORAL | 0 refills | Status: DC

## 2021-07-11 NOTE — Telephone Encounter (Signed)
Patient has an appt scheduled for 6/8. He said he will run out of [name] by 6/3. Can he get a refill to last him until his appt? ?

## 2021-07-11 NOTE — Telephone Encounter (Signed)
Notified patient.

## 2021-07-11 NOTE — Telephone Encounter (Signed)
Updated rx sent.  ? ?CM

## 2021-07-12 ENCOUNTER — Ambulatory Visit (INDEPENDENT_AMBULATORY_CARE_PROVIDER_SITE_OTHER): Payer: Managed Care, Other (non HMO)

## 2021-07-12 ENCOUNTER — Ambulatory Visit: Payer: Managed Care, Other (non HMO) | Admitting: Sports Medicine

## 2021-07-12 DIAGNOSIS — M79662 Pain in left lower leg: Secondary | ICD-10-CM | POA: Diagnosis not present

## 2021-07-12 DIAGNOSIS — M7989 Other specified soft tissue disorders: Secondary | ICD-10-CM

## 2021-07-12 IMAGING — US US EXTREM LOW VENOUS*L*
1 series · 14 of 24 positions shown · non-contrast
Comparison: None Available.

CLINICAL DATA: Left calf pain for 2 weeks.

EXAM:
Left LOWER EXTREMITY VENOUS DOPPLER ULTRASOUND
TECHNIQUE: Gray-scale sonography with compression, as well as color and duplex
ultrasound, were performed to evaluate the deep venous system(s)
from the level of the common femoral vein through the popliteal and
proximal calf veins.

[Series 1: us venous img lower uni left (dvt) · portal-venous · 14 of 51 slices shown]
[im 1/51]
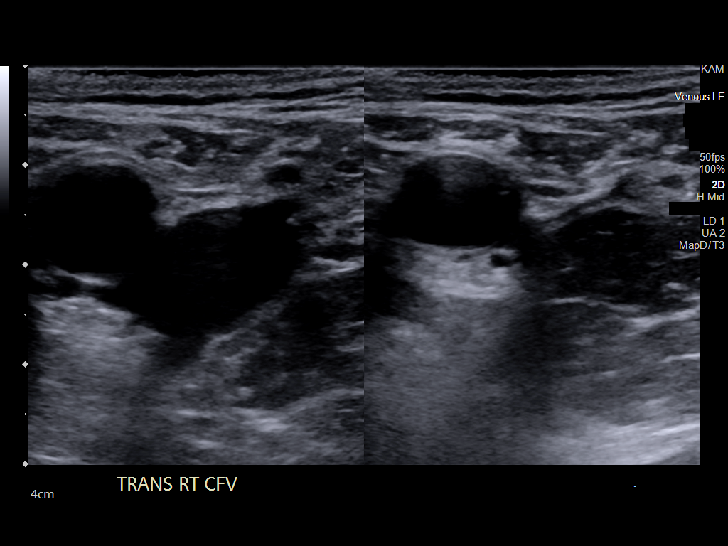
[im 5/51]
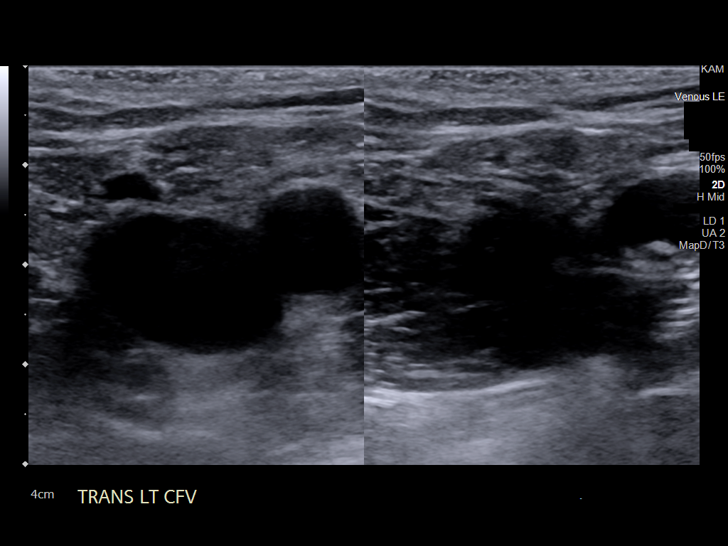
[im 9/51]
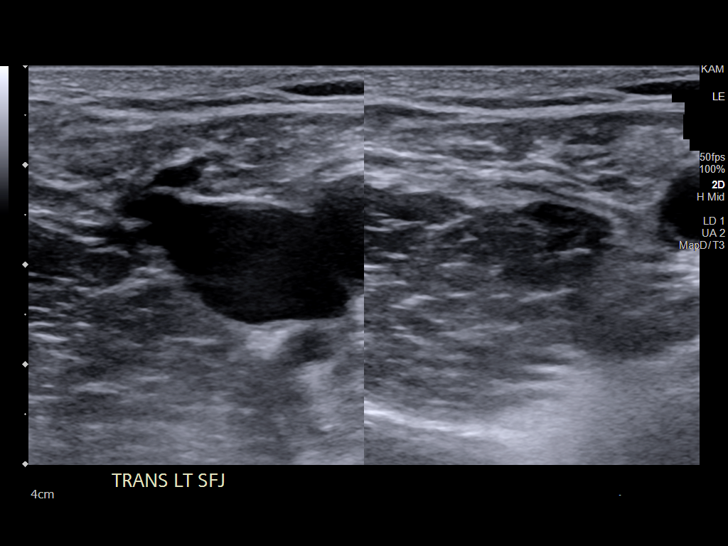
[im 14/51]
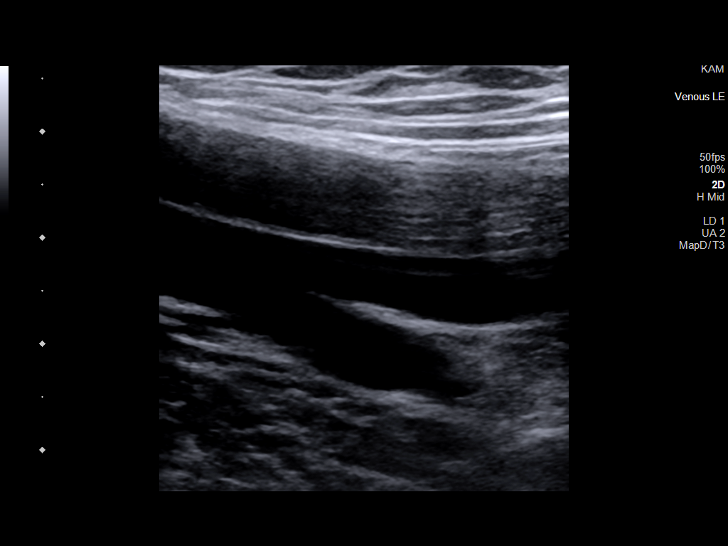
[im 16/51]
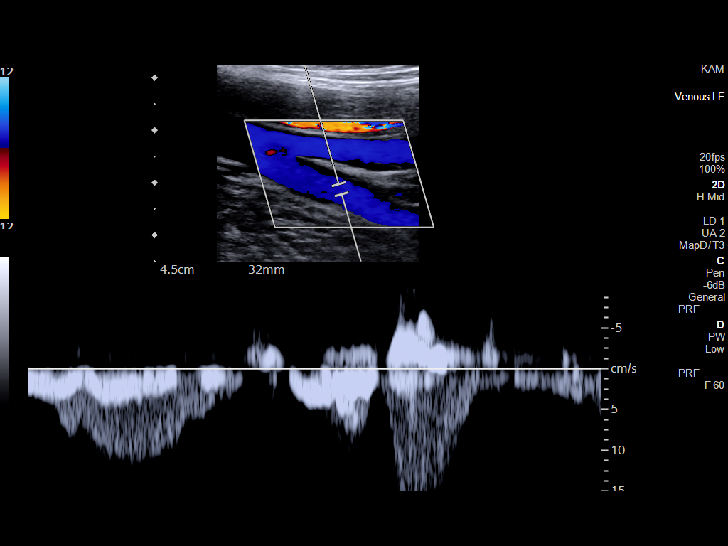
[im 20/51]
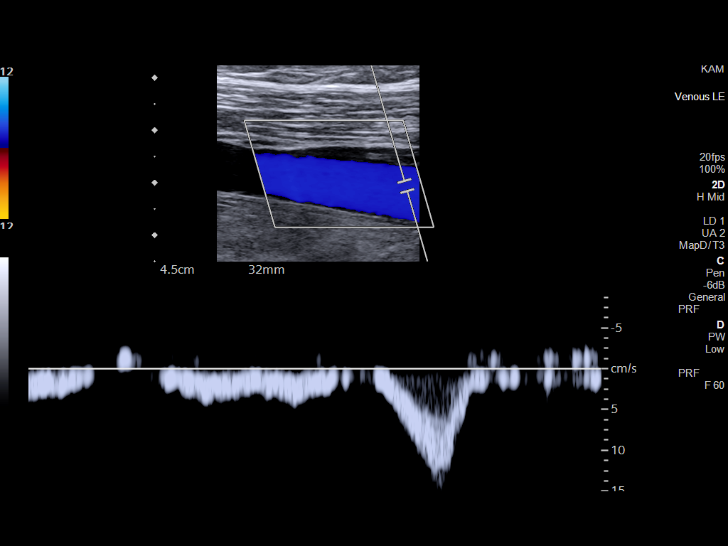
[im 24/51]
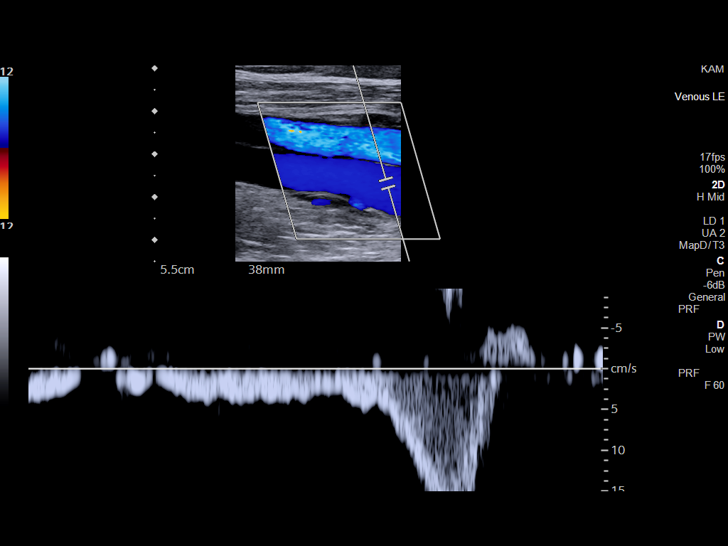
[im 27/51]
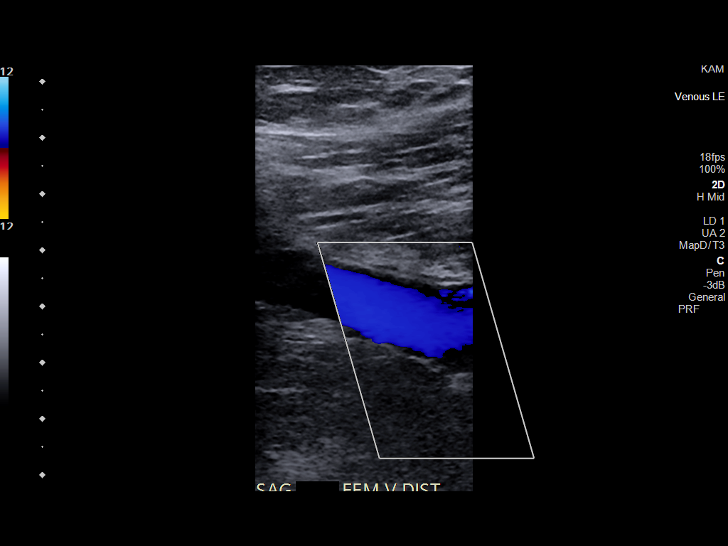
[im 31/51]
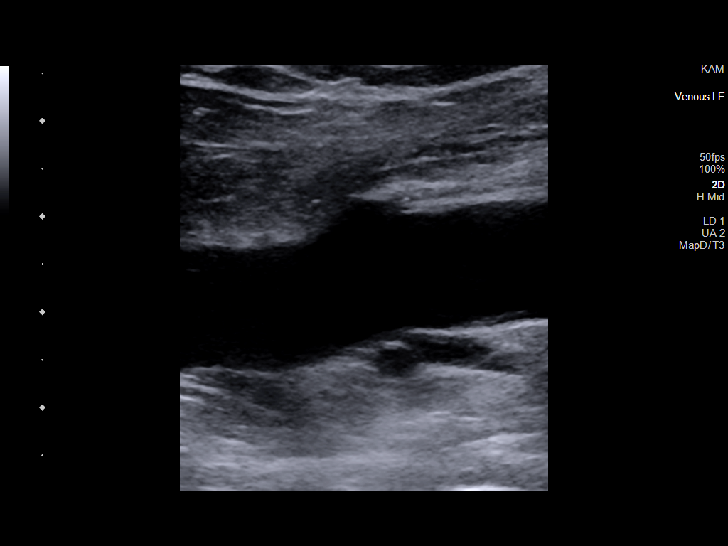
[im 35/51]
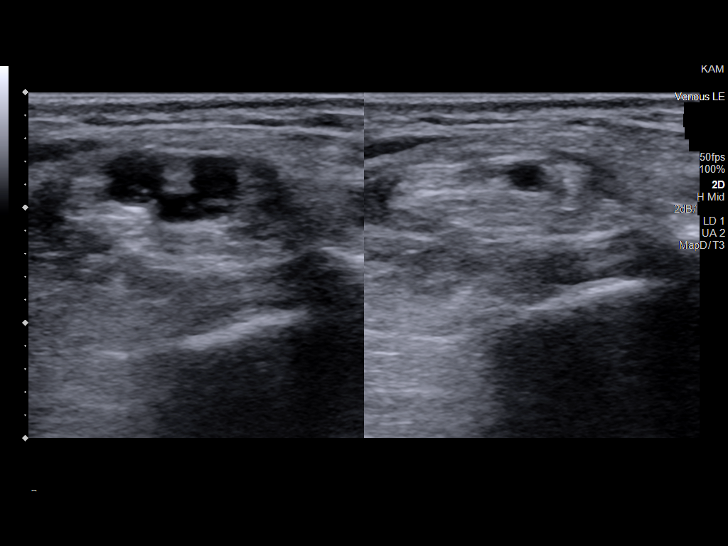
[im 40/51]
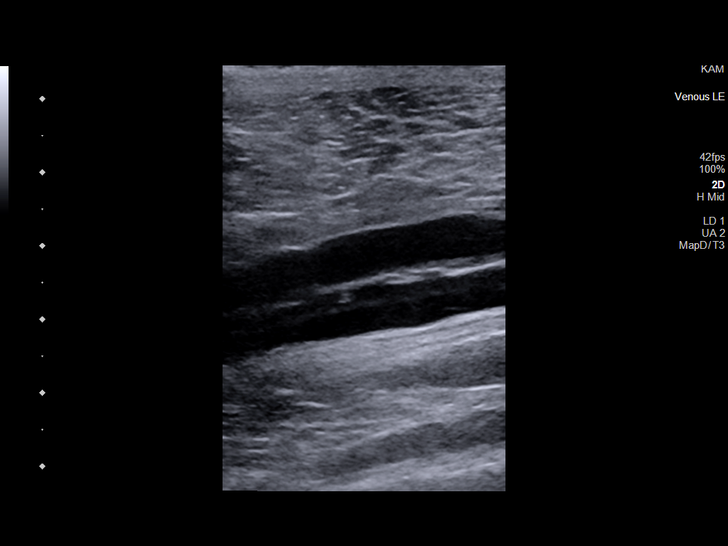
[im 42/51]
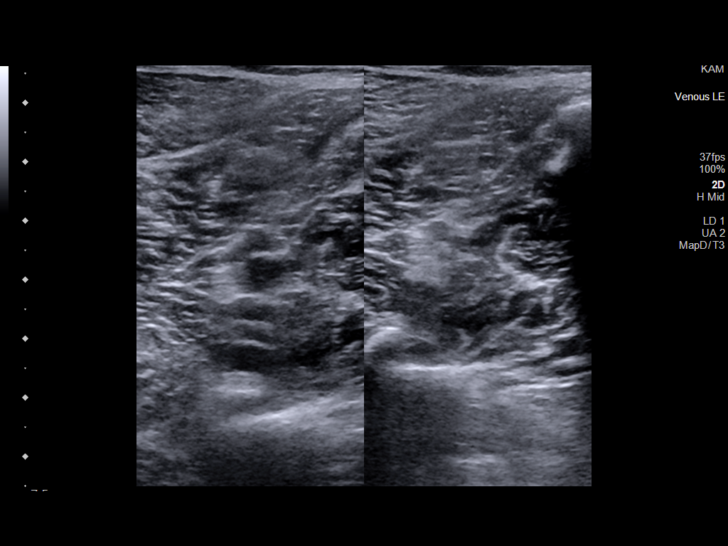
[im 46/51]
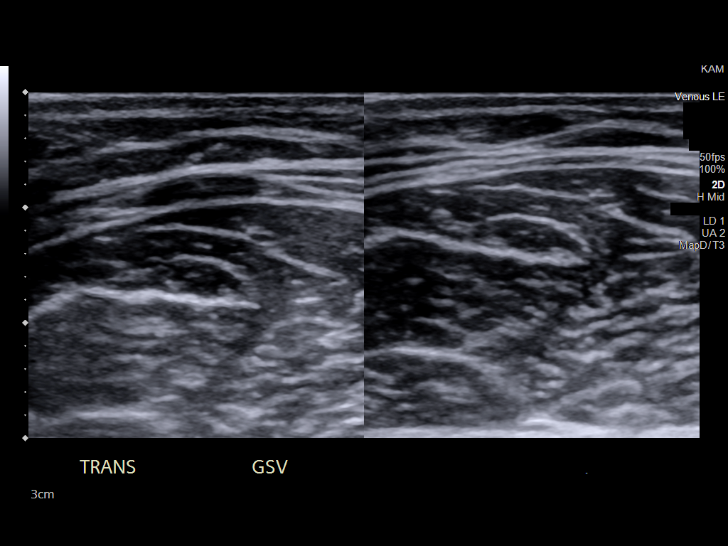
[im 51/51]
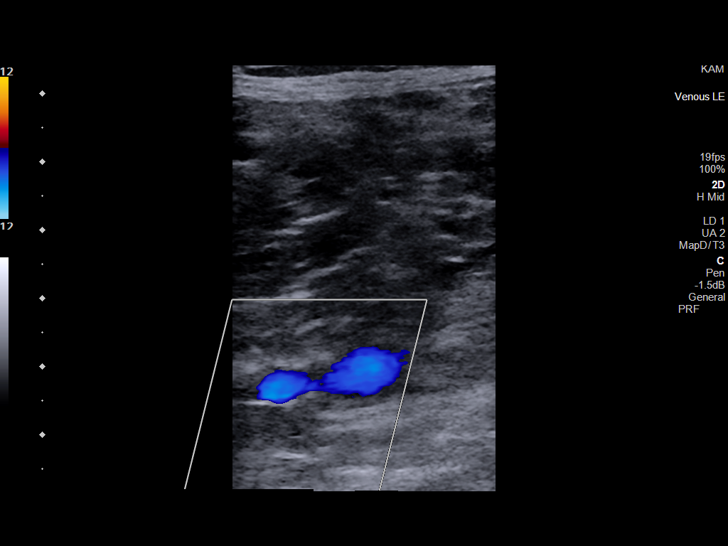

[14 of 24 positions shown; findings below may reference images not displayed]

FINDINGS: VENOUS

Normal compressibility of the common femoral, superficial femoral,
and popliteal veins, as well as the visualized calf veins.
Visualized portions of profunda femoral vein and great saphenous
vein unremarkable. No filling defects to suggest DVT on grayscale or
color Doppler imaging. Doppler waveforms show normal direction of
venous flow, normal respiratory plasticity and response to
augmentation.

Limited views of the contralateral common femoral vein are
unremarkable.

OTHER

None.

Limitations: none
IMPRESSION: Negative.

## 2021-07-12 MED ORDER — DICLOFENAC SODIUM 75 MG PO TBEC: 75.0000 mg | DELAYED_RELEASE_TABLET | Freq: Two times a day (BID) | ORAL | 3 refills | Status: DC

## 2021-07-12 NOTE — Progress Notes (Addendum)
? ? ?  Procedures performed today:   ? ?None. ? ?Independent interpretation of notes and tests performed by another provider:  ? ?None. ? ?Brief History, Exam, Impression, and Recommendations:   ? ?Left leg swelling ?Pleasant 36 year old male, recent calf strain, pain at the medial musculotendinous junction gastrocnemius. ?Mild swelling and bruising. ?He does have a positive Homans' sign, considering swelling and duration of over 2 weeks now without any improvement we will proceed with DVT ultrasound, I will also strap the calf, add heel lifts, conditioning. ?Discontinue ibuprofen, switching to Voltaren. ?Return to see me in 2 to 4 weeks and if insufficiently improved we will consider further investigation of the lumbar spine. ? ? ? ?___________________________________________ ?Gwen Her. Dianah Field, M.D., ABFM., CAQSM. ?Primary Care and Sports Medicine ?[place] ? ?Adjunct Instructor of Family Medicine  ?[employer] ?

## 2021-07-12 NOTE — Assessment & Plan Note (Addendum)
Pleasant 35 year old male, recent calf strain, pain at the medial musculotendinous junction gastrocnemius. ?Mild swelling and bruising. ?He does have a positive Homans' sign, considering swelling and duration of over 2 weeks now without any improvement we will proceed with DVT ultrasound, I will also strap the calf, add heel lifts, conditioning. ?Discontinue ibuprofen, switching to Voltaren. ?Return to see me in 2 to 4 weeks and if insufficiently improved we will consider further investigation of the lumbar spine. ?

## 2021-07-12 NOTE — Addendum Note (Signed)
Addended by: Silverio Decamp on: 07/12/2021 11:04 AM ? ? Modules accepted: Orders ? ?

## 2021-07-13 NOTE — Telephone Encounter (Signed)
Patient has been scheduled once weekly for 3 weeks for Euflexxa.

## 2021-07-13 NOTE — Telephone Encounter (Signed)
Euflexxa rec'd in the office today. Please call patient to schedule once weekly for 3 weeks.

## 2021-07-18 ENCOUNTER — Ambulatory Visit (INDEPENDENT_AMBULATORY_CARE_PROVIDER_SITE_OTHER): Payer: Managed Care, Other (non HMO)

## 2021-07-18 ENCOUNTER — Encounter: Payer: Self-pay | Admitting: Sports Medicine

## 2021-07-18 ENCOUNTER — Ambulatory Visit: Payer: Managed Care, Other (non HMO) | Admitting: Sports Medicine

## 2021-07-18 DIAGNOSIS — G8929 Other chronic pain: Secondary | ICD-10-CM | POA: Diagnosis not present

## 2021-07-18 DIAGNOSIS — M25561 Pain in right knee: Secondary | ICD-10-CM

## 2021-07-18 NOTE — Progress Notes (Signed)
    Procedures performed today:    Procedure: Real-time Ultrasound Guided injection of the right knee Device: Samsung HS60  Verbal informed consent obtained.  Time-out conducted.  Noted no overlying erythema, induration, or other signs of local infection.  Skin prepped in a sterile fashion.  Local anesthesia: Topical Ethyl chloride.  With sterile technique and under real time ultrasound guidance: Trace effusion, syringe Euflexxa injected easily into the suprapatellar recess through 22-gauge needle. Completed without difficulty  Advised to call if fevers/chills, erythema, induration, drainage, or persistent bleeding.  Images permanently stored and available for review in PACS.  Impression: Technically successful ultrasound guided injection.  Independent interpretation of notes and tests performed by another provider:   None.  Brief History, Exam, Impression, and Recommendations:    Chronic pain of right knee Euflexxa 1 of 3 right knee, return to see me in 1 week for #2 of 3.    ___________________________________________ Gwen Her. Dianah Field, M.D., ABFM., CAQSM. Primary Care and [place] Instructor of [place] [employer] [facility] [employer]

## 2021-07-18 NOTE — Assessment & Plan Note (Signed)
Euflexxa 1 of 3 right knee, return to see me in 1 week for #2 of 3.

## 2021-07-25 ENCOUNTER — Ambulatory Visit (INDEPENDENT_AMBULATORY_CARE_PROVIDER_SITE_OTHER): Payer: Managed Care, Other (non HMO)

## 2021-07-25 ENCOUNTER — Ambulatory Visit: Payer: Managed Care, Other (non HMO) | Admitting: Sports Medicine

## 2021-07-25 DIAGNOSIS — G8929 Other chronic pain: Secondary | ICD-10-CM | POA: Diagnosis not present

## 2021-07-25 DIAGNOSIS — M25561 Pain in right knee: Secondary | ICD-10-CM

## 2021-07-25 DIAGNOSIS — S86811A Strain of other muscle(s) and tendon(s) at lower leg level, right leg, initial encounter: Secondary | ICD-10-CM

## 2021-07-25 NOTE — Assessment & Plan Note (Signed)
Euflexxa 3 of 3 right knee, return to see me in 1 week for #3 of 3.

## 2021-07-25 NOTE — Assessment & Plan Note (Signed)
Just returned, persistent pain musculotendinous junction medial gastrocnemius. Its been over a month now. He also has some discomfort posterior/medial tibial shaft. He was a runner. This is much longer than we would expect for a strain, if persistent discomfort at the follow-up we will do a tib-fib MRI. Because there is discrete tenderness to palpation, concordant, we will keep his lumbar spine on the back burner.

## 2021-07-25 NOTE — Progress Notes (Signed)
    Procedures performed today:    Procedure: Real-time Ultrasound Guided injection of the right knee Device: Samsung HS60  Verbal informed consent obtained.  Time-out conducted.  Noted no overlying erythema, induration, or other signs of local infection.  Skin prepped in a sterile fashion.  Local anesthesia: Topical Ethyl chloride.  With sterile technique and under real time ultrasound guidance: Trace effusion, syringe Euflexxa injected easily into the suprapatellar recess through 22-gauge needle. Completed without difficulty  Advised to call if fevers/chills, erythema, induration, drainage, or persistent bleeding.  Images permanently stored and available for review in PACS.  Impression: Technically successful ultrasound guided injection.  Independent interpretation of notes and tests performed by another provider:   None.  Brief History, Exam, Impression, and Recommendations:    Chronic pain of right knee Euflexxa 3 of 3 right knee, return to see me in 1 week for #3 of 3.  Strain of right calf muscle Just returned, persistent pain musculotendinous junction medial gastrocnemius. Its been over a month now. He also has some discomfort posterior/medial tibial shaft. He was a runner. This is much longer than we would expect for a strain, if persistent discomfort at the follow-up we will do a tib-fib MRI. Because there is discrete tenderness to palpation, concordant, we will keep his lumbar spine on the back burner.    ___________________________________________ Gwen Her. Dianah Field, M.D., ABFM., CAQSM. Primary Care and [place] Instructor of [place] of [facility]

## 2021-07-26 ENCOUNTER — Ambulatory Visit (HOSPITAL_BASED_OUTPATIENT_CLINIC_OR_DEPARTMENT_OTHER): Payer: Managed Care, Other (non HMO) | Attending: Orthopaedic Surgery | Admitting: Physical Therapy

## 2021-07-26 ENCOUNTER — Other Ambulatory Visit: Payer: Self-pay | Admitting: Sports Medicine

## 2021-07-26 DIAGNOSIS — M25561 Pain in right knee: Secondary | ICD-10-CM | POA: Insufficient documentation

## 2021-07-26 DIAGNOSIS — R2689 Other abnormalities of gait and mobility: Secondary | ICD-10-CM

## 2021-07-26 DIAGNOSIS — G8929 Other chronic pain: Secondary | ICD-10-CM | POA: Insufficient documentation

## 2021-07-26 DIAGNOSIS — M79662 Pain in left lower leg: Secondary | ICD-10-CM

## 2021-07-26 DIAGNOSIS — M67959 Unspecified disorder of synovium and tendon, unspecified thigh: Secondary | ICD-10-CM | POA: Diagnosis present

## 2021-07-26 DIAGNOSIS — M25552 Pain in left hip: Secondary | ICD-10-CM | POA: Insufficient documentation

## 2021-07-26 DIAGNOSIS — M5136 Other intervertebral disc degeneration, lumbar region: Secondary | ICD-10-CM

## 2021-07-26 NOTE — Therapy (Addendum)
OUTPATIENT PHYSICAL THERAPY LOWER EXTREMITY EVALUATION   Patient Name: Alan Soto MRN: [id number] DOB:April 30, 1985, 36 y.o., male Today's Date: 07/27/2021   PT End of Session - 07/27/21 1000     Visit Number 1    Number of Visits 6    Date for PT Re-Evaluation 09/07/21    Authorization Type cigna    PT Start Time 1600    PT Stop Time 1650    PT Time Calculation (min) 50 min    Activity Tolerance Patient tolerated treatment well    Behavior During Therapy [facility] for tasks assessed/performed             Past Medical History:  Diagnosis Date   Asthma 02/27/1992   As per patient   Deviated septum 01/05/2016   Visit with ENT 01/12/2016: Turbinate reduction, septoplasty, bilateral maxillary antrostomy planned by ENT. 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   Esophageal reflux 06/22/2015   Status post EGD 06/03/2014. Small hiatal hernia, nonspecific gastritis    Essential hypertension 06/22/2015   History of ADHD 07/21/2015   Records reviewed from previous PCP, patient treated with [name], Wellbutrin, previously on dexmethylphenidate (Focalin)    History of cholecystectomy 06/22/2015   27/7824 - complications from surgery, persistent N/V and abdominal pain, subsequent Endo/Colonoscopies fine, removal seems to have resulted in food intol/allerg    Hypertension    Labral tear of shoulder 07/21/2015   Status post arthroscopy    Mild sleep apnea 07/21/2015   Status post sleep study people 21st 2016, recommendation with CPAP    S/P vasectomy 06/22/2015   Seasonal allergies 06/22/2015   Testosterone deficiency 06/22/2015   Previously followed by endocrine back in [place], see where they mention hypogonadism in to stop Depo (presumably Depo-testosterone) for 2 months and then reevaluate, not sure if patient ever followed up her these records just aren't available. Labs reviewed, there was 1 mention of abnormally low testosterone but other values  had been normal. Labs done by myself were normal. 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   Chronic ethmoidal sinusitis 08/28/2018   Benign mole 06/04/2017   Chronic pain of left ankle 06/04/2017   Mild intermittent asthma with exacerbation 06/04/2017   Scalp irritation 09/26/2016   Recurrent sinusitis 09/26/2016   Deviated septum 01/05/2016   Adult attention deficit disorder 12/18/2015   Eosinophilia 07/22/2015   History of ADHD 07/21/2015   Mild sleep apnea 07/21/2015   Labral tear of shoulder 07/21/2015   Testosterone deficiency 06/22/2015   Chronic fatigue 06/22/2015   Seasonal allergies 06/22/2015   Esophageal reflux 06/22/2015   History of anemia 06/22/2015   History of cholecystectomy 06/22/2015   S/P vasectomy 06/22/2015   Essential hypertension 06/22/2015    PCP: Weston Settle MD   REFERRING PROVIDER: Vanetta Mulders MD   REFERRING DIAG: [phone] (ICD-10-CM) - Tendinopathy of gluteus medius  THERAPY DIAG:  Pain in left hip  Other abnormalities of  gait and mobility  Chronic pain of right knee  Pain in left lower leg  Rationale for Evaluation and Treatment Rehabilitation  ONSET DATE:  SUBJECTIVE:   SUBJECTIVE STATEMENT: Patient is a frequent runner and aerobic/kickboxing fitness instructor; states he has taken a month off running but has been on his feet for work and not able to take a break. He reports a L calf strain 4 weeks ago that has  yet to resolve, l hip pain has been chronic ongoing for 6-7 months, MRI showed mild anterior labral tear. Pt also reports R knee pain, has received 2 corticosteroid injections that have not improved symptoms. Hip pain  PERTINENT HISTORY: Asthma, ADHD,   PAIN:  Are you having pain?  Yes: NPRS scale: 5/10 when weight baring  Pain location: left hip  Pain description: aching  Aggravating factors: standing and walking  Relieving factors: rest   PRECAUTIONS: None  WEIGHT BEARING RESTRICTIONS No  FALLS:  Has patient fallen in last 6 months? 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  Small anteroinferior labral tear.   2.  No full-thickness cartilage defect.   3.  No evidence of fracture or osteonecrosis.   4.  Muscles and tendons are within normal limits.  2/13 Knee MR   IMPRESSION: 1. Cartilage fissuring of the medial patellar facet and patellar apex with subchondral reactive marrow edema. 2. No meniscal or ligamentous injury of the right knee    PATIENT SURVEYS:  FOTO    COGNITION:  Overall cognitive status: Within functional limits for tasks assessed     SENSATION: WFL   POSTURE:   Bilateral flat foot; with single leg stance he feels like he goes off to the edge of his foot  PALPATION: Lumbar paraspinals tender L > R  Lumbar ROM Flexion: Full Extension: Full w/ pain over L PSIS L Rotation: Full R Rotation: Full  LOWER EXTREMITY ROM:  Active ROM Right eval Left eval  Hip flexion  Pinching in the front of the hip with flexion   Hip extension    Hip abduction    Hip adduction    Hip internal rotation    Hip external rotation    Knee flexion    Knee extension    Ankle  dorsiflexion    Ankle plantarflexion  Mild deficit in DF compared to the right 12 degreeson the right   Ankle inversion    Ankle eversion     (Blank rows = not tested)  LOWER EXTREMITY MMT:  MMT Right eval Left eval  Hip flexion 66.2 70.0  Hip extension    Hip abduction 66.8 62.6  Hip adduction    Hip internal rotation    Hip external rotation    Knee flexion    Knee extension 66.9 70.0  Ankle dorsiflexion    Ankle plantarflexion    Ankle inversion    Ankle eversion     (Blank rows = not tested)  Concordant L hip pain with abduction  LOWER EXTREMITY SPECIAL TESTS:   FUNCTIONAL TESTS:   Single leg stance: more limited on the right but supinates feet to lateral feet  Squat - concordant R knee pain at 65deg flexion  GAIT: Normal  gait: will do running assessment when pain has improved.    TODAY'S TREATMENT:  Program Notes Butter knife handle to the calf   Exercises - Supine Piriformis Stretch with Foot on Ground  - 1 x daily - 7 x weekly - 3 sets - 10 reps - Standing Single Leg Stance with Counter Support  - 1 x daily - 7 x weekly - 3 reps - 30 sec  hold - Supine Bridge with Resistance Band  - 1 x daily - 7 x weekly - 3 sets - 10 reps - 3-Way Lunge  - 1 x daily - 7 x weekly - 3 sets - 10 reps   PATIENT EDUCATION:  Education details: HEP; symptom management; use of orthotics Person educated: Patient Education method: Explanation, Demonstration, Tactile cues, Verbal cues, and Handouts Education comprehension: verbalized understanding   HOME EXERCISE PROGRAM:  Figure-4 stretch Bridging w/abduction band blue 3-way lunge w/blue band  ASSESSMENT:  CLINICAL IMPRESSION: Patient is a 36 y.o. male who was seen today for physical therapy evaluation and treatment for left gluteal pain; right knee pain and a left calf acute strain. He has bilateral flat foot and supinates to his lateral foot with single leg stance. His strength is equal. He has increased knee pain  with bending and lunging. He is likely shifting weight off the right knee and putting increased stress on the left hip. He has spasming of the left hip. He also has an acute strain of the left lateral calf. He was shown how to do self soft tissue mobilization to improve the calf. He had more pain in his calf with single leg stance. We will have to address the calf and knee in order to improve his hip pain. He would benefit from skilled therapy to return to running and continue with his fitness classes.     OBJECTIVE IMPAIRMENTS decreased activity tolerance, decreased mobility, decreased ROM, increased fascial restrictions, and pain.   ACTIVITY LIMITATIONS lifting, bending, standing, squatting, and locomotion level  PARTICIPATION LIMITATIONS: community activity, occupation, and running   [place] are also affecting patient's functional outcome.   REHAB POTENTIAL: Good  CLINICAL DECISION MAKING: Evolving/moderate complexity acute calf strain with multiple body part involvement   EVALUATION COMPLEXITY: Moderate   GOALS: Goals reviewed with patient? Yes  SHORT TERM GOALS: Target date: 08/17/2021   Patient will demonstrate full left hip flexion without pain  Baseline: Goal status: INITIAL  2.  Patient will demonstrate good single leg stance on the left side without pain  Baseline:  Goal status: INITIAL  3.  Patient will be independent with initial HEP  Baseline:  Goal status: INITIAL  LONG TERM GOALS: Target date: 09/07/2021   Patient will perform fitness classes's without pain  Baseline:  Goal status: INITIAL  2.  Patient will run 5 miles without pain  Baseline:  Goal status: INITIAL  3.  Patient will be independent with a complete exercise program Baseline:  Goal status: INITIAL     PLAN: PT FREQUENCY: 1x/week  PT DURATION: 6 weeks  PLANNED INTERVENTIONS: Therapeutic exercises, Therapeutic activity, Neuromuscular re-education, Balance training,  Gait training, Patient/Family education, Joint mobilization, Stair training, Aquatic Therapy, Dry Needling, Cryotherapy, Moist heat, Taping, Ultrasound, and Manual therapy  PLAN FOR NEXT SESSION: dry needling to the left hip; advance single leg stance activity as tolerated. Consider gym equipment eccentrics. See if he has gotten orthotics.   Visits from Start of Care: 1  Current functional level related to goals / functional  outcomes: Only came for initial evaluation    Remaining deficits: Unknown    Education / Equipment: HEP   Patient agrees to discharge. Patient goals were not not met. Patient is being discharged due to Not returning since last visit  Carney Living, PT DPT  07/27/2021, 11:41 AM

## 2021-07-27 ENCOUNTER — Encounter (HOSPITAL_BASED_OUTPATIENT_CLINIC_OR_DEPARTMENT_OTHER): Payer: Self-pay | Admitting: Physical Therapy

## 2021-08-01 ENCOUNTER — Ambulatory Visit (INDEPENDENT_AMBULATORY_CARE_PROVIDER_SITE_OTHER): Payer: Managed Care, Other (non HMO)

## 2021-08-01 ENCOUNTER — Telehealth: Payer: Self-pay | Admitting: Family Medicine

## 2021-08-01 ENCOUNTER — Ambulatory Visit: Payer: Managed Care, Other (non HMO) | Admitting: Sports Medicine

## 2021-08-01 DIAGNOSIS — M25561 Pain in right knee: Secondary | ICD-10-CM | POA: Diagnosis not present

## 2021-08-01 DIAGNOSIS — G8929 Other chronic pain: Secondary | ICD-10-CM | POA: Diagnosis not present

## 2021-08-01 DIAGNOSIS — M7989 Other specified soft tissue disorders: Secondary | ICD-10-CM | POA: Diagnosis not present

## 2021-08-01 MED ORDER — LISDEXAMFETAMINE DIMESYLATE 40 MG PO CAPS: 40.0000 mg | ORAL_CAPSULE | ORAL | 0 refills | Status: DC

## 2021-08-01 NOTE — Telephone Encounter (Signed)
He was here today and rescheduled the one with you on 6/8 since he has a fertility appointment that day.

## 2021-08-01 NOTE — Assessment & Plan Note (Signed)
Euflexxa 3 of 3 right knee, return as needed for this.

## 2021-08-01 NOTE — Assessment & Plan Note (Signed)
Alan Soto returns, he is a pleasant 36 year old male, continues to have pain left calf medial musculotendinous junction of the gastrocnemius. No bruising, he has had a DVT ultrasound, strapping, heel lifts, conditioning, unfortunately continues to have discomfort for the past 2 to 3 weeks. He did some exercising yesterday and it seemed to do okay, we will hold off on MRI for now.

## 2021-08-01 NOTE — Progress Notes (Signed)
    Procedures performed today:    Procedure: Real-time Ultrasound Guided injection of the right knee Device: Samsung HS60  Verbal informed consent obtained.  Time-out conducted.  Noted no overlying erythema, induration, or other signs of local infection.  Skin prepped in a sterile fashion.  Local anesthesia: Topical Ethyl chloride.  With sterile technique and under real time ultrasound guidance: Trace effusion, syringe Euflexxa injected easily into the suprapatellar recess through 22-gauge needle. Completed without difficulty  Advised to call if fevers/chills, erythema, induration, drainage, or persistent bleeding.  Images permanently stored and available for review in PACS.  Impression: Technically successful ultrasound guided injection.  Independent interpretation of notes and tests performed by another provider:   None.  Brief History, Exam, Impression, and Recommendations:    Chronic pain of right knee Euflexxa 3 of 3 right knee, return as needed for this.  Left leg swelling Alan Soto returns, he is a pleasant 36 year old male, continues to have pain left calf medial musculotendinous junction of the gastrocnemius. No bruising, he has had a DVT ultrasound, strapping, heel lifts, conditioning, unfortunately continues to have discomfort for the past 2 to 3 weeks. He did some exercising yesterday and it seemed to do okay, we will hold off on MRI for now.    ___________________________________________ Gwen Her. Dianah Field, M.D., ABFM., CAQSM. Primary Care and [place] Instructor of [place] of [facility] Medicine

## 2021-08-01 NOTE — Telephone Encounter (Signed)
Pt scheduled his F/u on [name] for Dr.Matthew's next available opening but he is wondering IF he can get some called in since he will be out before then?

## 2021-08-03 ENCOUNTER — Ambulatory Visit: Payer: Managed Care, Other (non HMO) | Admitting: Family Medicine

## 2021-08-03 ENCOUNTER — Encounter (HOSPITAL_BASED_OUTPATIENT_CLINIC_OR_DEPARTMENT_OTHER): Payer: Managed Care, Other (non HMO) | Admitting: Physical Therapy

## 2021-08-07 ENCOUNTER — Ambulatory Visit (HOSPITAL_BASED_OUTPATIENT_CLINIC_OR_DEPARTMENT_OTHER): Payer: Managed Care, Other (non HMO) | Admitting: Physical Therapy

## 2021-08-09 ENCOUNTER — Ambulatory Visit (HOSPITAL_BASED_OUTPATIENT_CLINIC_OR_DEPARTMENT_OTHER): Payer: Managed Care, Other (non HMO) | Admitting: Orthopaedic Surgery

## 2021-08-09 ENCOUNTER — Encounter (HOSPITAL_BASED_OUTPATIENT_CLINIC_OR_DEPARTMENT_OTHER): Payer: Self-pay | Admitting: Orthopaedic Surgery

## 2021-08-14 ENCOUNTER — Encounter (HOSPITAL_BASED_OUTPATIENT_CLINIC_OR_DEPARTMENT_OTHER): Payer: Managed Care, Other (non HMO) | Admitting: Physical Therapy

## 2021-08-17 ENCOUNTER — Encounter: Payer: Self-pay | Admitting: Sports Medicine

## 2021-08-21 ENCOUNTER — Encounter (HOSPITAL_BASED_OUTPATIENT_CLINIC_OR_DEPARTMENT_OTHER): Payer: Managed Care, Other (non HMO) | Admitting: Physical Therapy

## 2021-09-04 ENCOUNTER — Ambulatory Visit: Payer: Managed Care, Other (non HMO) | Admitting: Family Medicine

## 2021-09-04 ENCOUNTER — Encounter: Payer: Self-pay | Admitting: Family Medicine

## 2021-09-04 DIAGNOSIS — F988 Other specified behavioral and emotional disorders with onset usually occurring in childhood and adolescence: Secondary | ICD-10-CM

## 2021-09-04 MED ORDER — LISDEXAMFETAMINE DIMESYLATE 50 MG PO CAPS: 50.0000 mg | ORAL_CAPSULE | ORAL | 0 refills | Status: DC

## 2021-09-04 NOTE — Assessment & Plan Note (Signed)
Continue vyvanse, however increasing to '50mg'$  daily.  He will let me know how he is doing with this after a couple of weeks.  I will plan to follow up with him in 6 months or sooner if needed.

## 2021-09-04 NOTE — Progress Notes (Signed)
Alan Soto - 36 y.o. male MRN [id number]  Date of birth: 1985/10/21  Subjective Chief Complaint  Patient presents with   ADD    HPI Alan Soto is a 36 y.o. male here today for follow up of ADHD.  Current treatment with [name] '40mg'$  daily.  He feels that he has some brain fog still and focus difficulties, would be interested in trying a higher strength.  He denies side effects related to medication.  BP remains fairly well controlled.  No palpitations, headache or insomnia.    ROS:  A comprehensive ROS was completed and negative except as noted per HPI  Allergies  Allergen Reactions   Penicillins Shortness Of Breath and Anaphylaxis    Past Medical History:  Diagnosis Date   Asthma 02/27/1992   As per patient   Deviated septum 01/05/2016   Visit with ENT 01/12/2016: Turbinate reduction, septoplasty, bilateral maxillary antrostomy planned by ENT. 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   Esophageal reflux 06/22/2015   Status post EGD 06/03/2014. Small hiatal hernia, nonspecific gastritis    Essential hypertension 06/22/2015   History of ADHD 07/21/2015   Records reviewed from previous PCP, patient treated with [name], Wellbutrin, previously on dexmethylphenidate (Focalin)    History of cholecystectomy 06/22/2015   35/0093 - complications from surgery, persistent N/V and abdominal pain, subsequent Endo/Colonoscopies fine, removal seems to have resulted in food intol/allerg    Hypertension    Labral tear of shoulder 07/21/2015   Status post arthroscopy    Mild sleep apnea 07/21/2015   Status post sleep study people 21st 2016, recommendation with CPAP    S/P vasectomy 06/22/2015   Seasonal allergies 06/22/2015   Testosterone deficiency 06/22/2015   Previously followed by endocrine back in [place], see where they mention hypogonadism in to stop Depo (presumably Depo-testosterone) for 2 months and then reevaluate, not sure if patient  ever followed up her these records just aren't available. Labs reviewed, there was 1 mention of abnormally low testosterone but other values had been normal. Labs done by myself were normal. Consider endocrine referral if patient wants to pursue this issue further     Past Surgical History:  Procedure Laterality Date   CHOLECYSTECTOMY  01/2015   SHOULDER SURGERY  11/2014    Social History   Socioeconomic History   Marital status: Divorced    Spouse name: Not on file   Number of children: Not on file   Years of education: Not on file   Highest education level: Not on file  Occupational History   Not on file  Tobacco Use   Smoking status: Never   Smokeless tobacco: Never  Substance and Sexual Activity   Alcohol use: Not on file   Drug use: Not on file   Sexual activity: Not on file  Other Topics Concern   Not on file  Social History Narrative   Not on file   Social Determinants of Health   Financial Resource Strain: Not on file  Food Insecurity: Not on file  Transportation Needs: Not on file  Physical Activity: Not on file  Stress: Not on file  Social Connections: Not on file    History reviewed. No pertinent family history.  Health Maintenance  Topic Date Due   Hepatitis C Screening  Never done   COVID-19 Vaccine (3 - Pfizer risk series) 07/28/2019   INFLUENZA VACCINE  09/26/2021   TETANUS/TDAP  05/09/2028   HIV Screening  Completed   HPV VACCINES  Aged Out     ----------------------------------------------------------------------------------------------------------------------------------------------------------------------------------------------------------------- Physical Exam BP 135/65 (BP Location: Left Arm, Patient Position: Sitting, Cuff Size: Large)   Pulse 66   Ht '6\' 1"'$  (1.854 m)   Wt 239 lb (108.4 kg)   SpO2 95%   BMI 31.53 kg/m   Physical Exam Constitutional:      Appearance: Normal appearance.  Eyes:     General: No scleral  icterus. Cardiovascular:     Rate and Rhythm: Normal rate and regular rhythm.  Pulmonary:     Effort: Pulmonary effort is normal.     Breath sounds: Normal breath sounds.  Musculoskeletal:     Cervical back: Neck supple.  Neurological:     Mental Status: He is alert.  Psychiatric:        Mood and Affect: Mood normal.        Behavior: Behavior normal.     ------------------------------------------------------------------------------------------------------------------------------------------------------------------------------------------------------------------- Assessment and Plan  Adult attention deficit disorder Continue vyvanse, however increasing to '50mg'$  daily.  He will let me know how he is doing with this after a couple of weeks.  I will plan to follow up with him in 6 months or sooner if needed.    Meds ordered this encounter  Medications   lisdexamfetamine ([name]) 50 MG capsule    Sig: Take 1 capsule (50 mg total) by mouth every morning.    Dispense:  15 capsule    Refill:  0    Return in about 6 months (around 03/07/2022) for ADHD.    This visit occurred during the SARS-CoV-2 public health emergency.  Safety protocols were in place, including screening questions prior to the visit, additional usage of staff PPE, and extensive cleaning of exam room while observing appropriate contact time as indicated for disinfecting solutions.

## 2021-09-04 NOTE — Patient Instructions (Signed)
Let me know if the '50mg'$  [name] works any better for you.  Follow up in 6 months.

## 2021-09-05 ENCOUNTER — Other Ambulatory Visit: Payer: Self-pay

## 2021-09-05 MED ORDER — LISDEXAMFETAMINE DIMESYLATE 50 MG PO CAPS
50.0000 mg | ORAL_CAPSULE | Freq: Every day | ORAL | 0 refills | Status: DC
Start: 2021-09-05 — End: 2021-09-19

## 2021-09-05 NOTE — Addendum Note (Signed)
Addended by: Peggye Ley on: 09/05/2021 09:10 AM   Modules accepted: Orders

## 2021-09-19 ENCOUNTER — Encounter: Payer: Self-pay | Admitting: Family Medicine

## 2021-09-19 MED ORDER — LISDEXAMFETAMINE DIMESYLATE 50 MG PO CAPS
50.0000 mg | ORAL_CAPSULE | Freq: Every day | ORAL | 0 refills | Status: DC
Start: 2021-09-19 — End: 2021-10-23

## 2021-09-19 NOTE — Telephone Encounter (Signed)
Completed.

## 2021-10-23 ENCOUNTER — Encounter: Payer: Self-pay | Admitting: Family Medicine

## 2021-10-23 MED ORDER — LISDEXAMFETAMINE DIMESYLATE 50 MG PO CAPS: 50.0000 mg | ORAL_CAPSULE | Freq: Every day | ORAL | 0 refills | Status: DC

## 2021-10-23 NOTE — Telephone Encounter (Signed)
Last OV was 09/04/21 with instructions to return in 6 months.  Last RX was 09/19/21 for #30.  Charyl Bigger, CMA

## 2021-11-19 ENCOUNTER — Other Ambulatory Visit: Payer: Self-pay | Admitting: Sports Medicine

## 2021-11-19 DIAGNOSIS — M7989 Other specified soft tissue disorders: Secondary | ICD-10-CM

## 2021-11-20 ENCOUNTER — Encounter: Payer: Self-pay | Admitting: Family Medicine

## 2021-11-20 MED ORDER — LISDEXAMFETAMINE DIMESYLATE 50 MG PO CAPS: 50.0000 mg | ORAL_CAPSULE | Freq: Every day | ORAL | 0 refills | Status: DC

## 2021-11-20 NOTE — Telephone Encounter (Signed)
Completed.

## 2021-12-19 ENCOUNTER — Encounter: Payer: Self-pay | Admitting: Family Medicine

## 2021-12-19 MED ORDER — LISDEXAMFETAMINE DIMESYLATE 50 MG PO CAPS: 50.0000 mg | ORAL_CAPSULE | Freq: Every day | ORAL | 0 refills | Status: DC

## 2022-01-14 ENCOUNTER — Encounter: Payer: Self-pay | Admitting: Family Medicine

## 2022-01-15 MED ORDER — LISDEXAMFETAMINE DIMESYLATE 50 MG PO CAPS: 50.0000 mg | ORAL_CAPSULE | Freq: Every day | ORAL | 0 refills | Status: DC

## 2022-03-01 ENCOUNTER — Encounter: Payer: Self-pay | Admitting: Family Medicine

## 2022-03-01 ENCOUNTER — Ambulatory Visit: Payer: Managed Care, Other (non HMO) | Admitting: Family Medicine

## 2022-03-01 VITALS — BP 132/79 | HR 58 | Ht 73.0 in | Wt 237.0 lb

## 2022-03-01 DIAGNOSIS — Z862 Personal history of diseases of the blood and blood-forming organs and certain disorders involving the immune mechanism: Secondary | ICD-10-CM | POA: Diagnosis not present

## 2022-03-01 DIAGNOSIS — E349 Endocrine disorder, unspecified: Secondary | ICD-10-CM

## 2022-03-01 DIAGNOSIS — F988 Other specified behavioral and emotional disorders with onset usually occurring in childhood and adolescence: Secondary | ICD-10-CM | POA: Diagnosis not present

## 2022-03-01 DIAGNOSIS — M7989 Other specified soft tissue disorders: Secondary | ICD-10-CM

## 2022-03-01 DIAGNOSIS — R5382 Chronic fatigue, unspecified: Secondary | ICD-10-CM

## 2022-03-01 DIAGNOSIS — G4719 Other hypersomnia: Secondary | ICD-10-CM

## 2022-03-01 MED ORDER — DICLOFENAC SODIUM 75 MG PO TBEC: 75.0000 mg | DELAYED_RELEASE_TABLET | Freq: Two times a day (BID) | ORAL | 3 refills | Status: DC

## 2022-03-01 MED ORDER — AMPHETAMINE-DEXTROAMPHETAMINE 10 MG PO TABS: 10.0000 mg | ORAL_TABLET | Freq: Every day | ORAL | 0 refills | Status: DC

## 2022-03-01 MED ORDER — LISDEXAMFETAMINE DIMESYLATE 50 MG PO CAPS: 50.0000 mg | ORAL_CAPSULE | Freq: Every day | ORAL | 0 refills | Status: DC

## 2022-03-01 NOTE — Progress Notes (Signed)
Alan Soto - 37 y.o. male MRN [id number]  Date of birth: 1985/12/02  Subjective Chief Complaint  Patient presents with   Mood    HPI Alan Soto is a 37 year old male here today for follow-up visit.  He is currently treated with [name] 50 mg daily.  He reports that he was initially started on this by his former PCP due to fatigue and brain fog.  He feels that current medication is not fully effective throughout the day.  Additionally he continues to feel fatigued.  Previously treated for hypogonadism as well and felt that combination of [name] and testosterone placement seem to work better for him.  He does have history of iron deficiency anemia.  He would like to know if there are other options for treatment of his daytime fatigue may be more effective than [name].  ROS:  A comprehensive ROS was completed and negative except as noted per HPI  Allergies  Allergen Reactions   Penicillins Shortness Of Breath and Anaphylaxis    Past Medical History:  Diagnosis Date   Asthma 02/27/1992   As per patient   Deviated septum 01/05/2016   Visit with ENT 01/12/2016: Turbinate reduction, septoplasty, bilateral maxillary antrostomy planned by ENT. 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   Esophageal reflux 06/22/2015   Status post EGD 06/03/2014. 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 Labs reviewed, there was 1 mention of abnormally low testosterone but other values had been normal. Labs done by myself were normal. Consider endocrine referral if patient wants to pursue this issue further     Past Surgical History:  Procedure Laterality Date   CHOLECYSTECTOMY  01/2015   SHOULDER SURGERY  11/2014    Social History   Socioeconomic History   Marital status: Divorced    Spouse name: Not on file   Number of children: Not on file   Years of education: Not on file   Highest education level: Not on file  Occupational History   Not on file  Tobacco Use   Smoking status: Never   Smokeless tobacco: Never  Substance and Sexual Activity   Alcohol use: Not on file   Drug use: Not on file   Sexual activity: Not on file  Other Topics Concern   Not on file  Social History Narrative   Not on file   Social Determinants of Health   Financial Resource Strain: Not on file  Food Insecurity: Not on file  Transportation Needs: Not on file  Physical Activity: Not on file  Stress: Not on file  Social Connections: Not on file    History reviewed. No  pertinent family history.  Health Maintenance  Topic Date Due   Hepatitis C Screening  Never done   COVID-19 Vaccine (3 - Pfizer risk series) 03/18/2023 (Originally 07/28/2019)   INFLUENZA VACCINE  05/27/2023 (Originally 09/26/2021)   DTaP/Tdap/Td (3 - Td or Tdap) 05/09/2028   HIV Screening  Completed   HPV VACCINES  Aged Out      ----------------------------------------------------------------------------------------------------------------------------------------------------------------------------------------------------------------- Physical Exam BP 132/79 (BP Location: Left Arm, Patient Position: Sitting, Cuff Size: Large)   Pulse (!) 58   Ht '6\' 1"'$  (1.854 m)   Wt 237 lb (107.5 kg)   SpO2 98%   BMI 31.27 kg/m   Physical Exam Constitutional:      Appearance: Normal appearance.  HENT:     Head: Normocephalic and atraumatic.  Cardiovascular:     Rate and Rhythm: Normal rate and regular rhythm.  Pulmonary:     Effort: Pulmonary effort is normal.     Breath sounds: Normal breath sounds.  Musculoskeletal:     Cervical back: Neck supple.  Neurological:     Mental Status: He is alert.  Psychiatric:        Mood and Affect: Mood normal.        Behavior: Behavior normal.     ------------------------------------------------------------------------------------------------------------------------------------------------------------------------------------------------------------------- Assessment and Plan  Chronic fatigue He continues to deal with chronic fatigue.  Updating labs he does have history of hypogonadism and iron deficiency anemia.    Adult attention deficit disorder Reports that [name] was started previously due to fatigue and brain fog during the day.  His previous PCPs notes indicate that he had prior evaluation for ADD however this does not seem to be the case.  He has found that this has worked well for his concentration as well as his daytime fatigue.  We discussed options for management including continuation of [name] at current strength with addition of short acting Adderall in the afternoon and 10 mg.  Additional options discussed include changing to Provigil or Nuvigil   Meds ordered this encounter  Medications   diclofenac (VOLTAREN) 75 MG EC tablet    Sig: Take 1 tablet (75 mg  total) by mouth 2 (two) times daily.    Dispense:  60 tablet    Refill:  3   lisdexamfetamine ([name]) 50 MG capsule    Sig: Take 1 capsule (50 mg total) by mouth daily.    Dispense:  30 capsule    Refill:  0   amphetamine-dextroamphetamine (ADDERALL) 10 MG tablet    Sig: Take 1 tablet (10 mg total) by mouth daily. Take in the afternoon as needed.    Dispense:  30 tablet    Refill:  0    Return in about 6 months (around 08/30/2022) for ADD.    This visit occurred during the SARS-CoV-2 public health emergency.  Safety protocols were in place, including screening questions prior to the visit, additional usage of staff PPE, and extensive cleaning of exam room while observing appropriate contact time as indicated for disinfecting solutions.

## 2022-03-01 NOTE — Patient Instructions (Signed)
Try the '10mg'$  adderall in the early afternoon.  Let me know if this is helpful.  Have labs completed when fasting preferably prior to 10am.

## 2022-03-01 NOTE — Assessment & Plan Note (Signed)
He continues to deal with chronic fatigue.  Updating labs he does have history of hypogonadism and iron deficiency anemia.

## 2022-03-01 NOTE — Assessment & Plan Note (Signed)
Reports that [name] was started previously due to fatigue and brain fog during the day.  His previous PCPs notes indicate that he had prior evaluation for ADD however this does not seem to be the case.  He has found that this has worked well for his concentration as well as his daytime fatigue.  We discussed options for management including continuation of [name] at current strength with addition of short acting Adderall in the afternoon and 10 mg.  Additional options discussed include changing to Provigil or Nuvigil

## 2022-03-02 MED ORDER — MODAFINIL 200 MG PO TABS: 200.0000 mg | ORAL_TABLET | Freq: Every day | ORAL | 1 refills | Status: DC

## 2022-03-02 NOTE — Telephone Encounter (Signed)
Task completed. Pharmacy notified to cancel the refill for vyvanse/adderall rx sent on 03/02/22.

## 2022-03-06 ENCOUNTER — Telehealth: Payer: Self-pay

## 2022-03-06 NOTE — Telephone Encounter (Addendum)
Initiated Prior authorization UJW:[id number] '200MG'$  tablets Via: Covermymeds Case/Key:[id number] Status: denied as of 03/06/21 Reason:There is no indication that your patient has: A. Adjunctive/Augmentation Treatment for Depression  in Adults; B. Treatment of Fatigue Associated with Multiple Sclerosis (MS); C. Treatment of  Excessive Daytime Sleepiness Associated with Myotonic Dystrophy; D. Treatment of Excessive  Daytime Sleepiness Associated with Narcolepsy Type 1; E. Treatment of Excessive Daytime  Sleepiness Associated with Narcolepsy Type 2; F. Treatment of Excessive Daytime Sleepiness  Associated with Obstructive Sleep Apnea (OSA)/Hypoapnea Syndrome (OSAHS); G. Treatment of  Excessive Daytime Sleepiness Associated with Parkinson's Disease (PD); H. Treatment of  Excessive Daytime Sleepiness Associated with Shift Work Sleep Disorder (SWSD); I. Treatment of  Idiopathic Hypersomnia. [employer] Scheu[employer] does not cover this drug for any other indication because it is  considered experimental, investigational, or unproven, including the following (this list may not be  all inclusive): 1. Attention Deficit Hyperactivity Disorder (ADHD); 2. Bipolar Disorder, including  Bipolar Depression; 3. Cancer-Related Fatigue; 4. Chronic Fatigue Syndrome; 5. Enhancement of  Performance in Situations of Induced Sleep Deprivation; 6. Excessive Daytime Sleepiness  Associated with Primary Insomnia; 7. Fibromyalgia; 8. Hypersomnia, Fatigue or Sleepiness Due to  Other Conditions (not Idiopathic Hypersomnia); and 9. Post-Stroke Sleep-Wake Disorders or Sleep  Disorders. Armodafinil and modafinil are considered medically necessary for one of the following indications:  A. Adjunctive/Augmentation Treatment for Depression in Adults; B. Treatment of Fatigue  Associated with Multiple Sclerosis (MS); C. Treatment of Excessive Daytime Sleepiness  Associated with Myotonic Dystrophy; D. Treatment of Excessive Daytime Sleepiness Associated   with Narcolepsy Type 1; E. Treatment of Excessive Daytime Sleepiness Associated with  Narcolepsy Type 2; F. Treatment of Excessive Daytime Sleepiness Associated with Obstructive  Sleep Apnea (OSA)/Hypoapnea Syndrome (OSAHS); G. Treatment of Excessive Daytime  Sleepiness Associated with Parkinson's Disease (PD); H. Treatment of Excessive Daytime  Sleepiness Associated with Shift Work Sleep Disorder (SWSD); I. Treatment of Idiopathic  Hypersomnia. Continuation of Armodafinil (Nuvigil) and modafinil (Provigil) is considered medically  necessary for all covered diagnoses when the above medical necessity criteria are met and there  is documentation of beneficial response. Armodafinil and modafinil are considered experimental,  investigational or unproven for any other use including the following (this list may not be all  inclusive): 1. Attention Deficit Hyperactivity Disorder (ADHD). 2. Bipolar Disorder, including Bipolar  Depression. 3. Cancer-Related Fatigue. 4. Chronic Fatigue Syndrome. 5. Excessive Daytime  Sleepiness Associated with Primary Insomnia. 6. Enhancement of Performance in Situations of  Induced Sleep Deprivation. 7. Fibromyalgia. 8. Hypersomnia, Fatigue or Sleepiness Due to Other  Conditions (not Idiopathic Hypersomnia). 9. Post-Stroke Sleep-Wake Disorders or Sleep Disorders. [employer] Coverage Policy [id number] Armodafinil/Modafinil was used to complete this review. I have reviewed information from your patient's benefit plan and any policies and guidelines  needed to reach this decision. The information submitted did not meet the criteria necessary to  approve this medication.  Notified Pt via: Mychart

## 2022-04-05 ENCOUNTER — Encounter: Payer: Self-pay | Admitting: Family Medicine

## 2022-04-05 DIAGNOSIS — F988 Other specified behavioral and emotional disorders with onset usually occurring in childhood and adolescence: Secondary | ICD-10-CM

## 2022-04-05 MED ORDER — LISDEXAMFETAMINE DIMESYLATE 50 MG PO CAPS: 50.0000 mg | ORAL_CAPSULE | Freq: Every day | ORAL | 0 refills | Status: DC

## 2022-04-05 MED ORDER — AMPHETAMINE-DEXTROAMPHETAMINE 10 MG PO TABS: 10.0000 mg | ORAL_TABLET | Freq: Every day | ORAL | 0 refills | Status: DC

## 2022-04-13 MED ORDER — AMPHETAMINE-DEXTROAMPHET ER 25 MG PO CP24: 25.0000 mg | ORAL_CAPSULE | ORAL | 0 refills | Status: DC

## 2022-04-13 NOTE — Addendum Note (Signed)
Addended by: Perlie Mayo on: 04/13/2022 08:08 AM   Modules accepted: Orders

## 2022-05-10 MED ORDER — AMPHETAMINE-DEXTROAMPHETAMINE 10 MG PO TABS: 10.0000 mg | ORAL_TABLET | Freq: Every day | ORAL | 0 refills | Status: DC

## 2022-05-10 MED ORDER — AMPHETAMINE-DEXTROAMPHET ER 25 MG PO CP24: 25.0000 mg | ORAL_CAPSULE | ORAL | 0 refills | Status: DC

## 2022-05-10 NOTE — Addendum Note (Signed)
Addended by: Perlie Mayo on: 05/10/2022 05:13 PM   Modules accepted: Orders

## 2022-05-10 NOTE — Addendum Note (Signed)
Addended by: Narda Rutherford on: 05/10/2022 08:48 AM   Modules accepted: Orders

## 2022-06-08 ENCOUNTER — Encounter: Payer: Self-pay | Admitting: Family Medicine

## 2022-06-08 DIAGNOSIS — F988 Other specified behavioral and emotional disorders with onset usually occurring in childhood and adolescence: Secondary | ICD-10-CM

## 2022-06-08 MED ORDER — AMPHETAMINE-DEXTROAMPHETAMINE 10 MG PO TABS: 10.0000 mg | ORAL_TABLET | Freq: Every day | ORAL | 0 refills | Status: DC

## 2022-06-08 MED ORDER — AMPHETAMINE-DEXTROAMPHET ER 25 MG PO CP24: 25.0000 mg | ORAL_CAPSULE | ORAL | 0 refills | Status: DC

## 2022-06-11 ENCOUNTER — Encounter: Payer: Self-pay | Admitting: *Deleted

## 2022-07-09 ENCOUNTER — Encounter: Payer: Self-pay | Admitting: Family Medicine

## 2022-07-09 ENCOUNTER — Other Ambulatory Visit: Payer: Self-pay

## 2022-07-09 DIAGNOSIS — F988 Other specified behavioral and emotional disorders with onset usually occurring in childhood and adolescence: Secondary | ICD-10-CM

## 2022-07-09 NOTE — Telephone Encounter (Signed)
Last fill 06/08/22 last visit 03/01/22. No follow up scheduled.

## 2022-07-11 MED ORDER — AMPHETAMINE-DEXTROAMPHETAMINE 10 MG PO TABS: 10.0000 mg | ORAL_TABLET | Freq: Every day | ORAL | 0 refills | Status: DC

## 2022-07-11 MED ORDER — AMPHETAMINE-DEXTROAMPHET ER 25 MG PO CP24
25.0000 mg | ORAL_CAPSULE | ORAL | 0 refills | Status: DC
Start: 2022-07-11 — End: 2022-08-09

## 2022-07-23 ENCOUNTER — Other Ambulatory Visit: Payer: Self-pay | Admitting: Sports Medicine

## 2022-07-23 DIAGNOSIS — M7989 Other specified soft tissue disorders: Secondary | ICD-10-CM

## 2022-08-08 ENCOUNTER — Ambulatory Visit: Payer: Managed Care, Other (non HMO) | Admitting: Sports Medicine

## 2022-08-08 DIAGNOSIS — M5136 Other intervertebral disc degeneration, lumbar region: Secondary | ICD-10-CM

## 2022-08-08 DIAGNOSIS — R5382 Chronic fatigue, unspecified: Secondary | ICD-10-CM

## 2022-08-08 DIAGNOSIS — M51369 Other intervertebral disc degeneration, lumbar region without mention of lumbar back pain or lower extremity pain: Secondary | ICD-10-CM

## 2022-08-08 MED ORDER — PREDNISONE 50 MG PO TABS
ORAL_TABLET | ORAL | 0 refills | Status: DC
Start: 2022-08-08 — End: 2022-09-06

## 2022-08-08 MED ORDER — GABAPENTIN 300 MG PO CAPS
ORAL_CAPSULE | ORAL | 3 refills | Status: DC
Start: 2022-08-08 — End: 2023-01-14

## 2022-08-08 NOTE — Assessment & Plan Note (Signed)
Alan Soto does deal with some chronic fatigue, he had some labs ordered by Dr. Ashley Royalty that he has not yet had drawn yet, I will have him get these done today. He was also interested in consideration of supplements such as peptides, I advised that we did not have any evidence that this was highly beneficial, he also was interested in testosterone supplementation. He has done TRT in the past, he and his fiance are trying get pregnant and I did advise him that starting TRT would decrease his sperm counts and make him infertile. Due to his age I would like to add [facility] and LH to his labs to ensure were not dealing with hypogonadotrophic hypogonadism, if it does look like hypogonadotrophic hypogonadism we can supplement him with [facility] and LH and this would not ruin his sperm counts. In the future we should also consider mood disorder and sleep apnea.

## 2022-08-08 NOTE — Progress Notes (Signed)
    Procedures performed today:    None.  Independent interpretation of notes and tests performed by another provider:   None.  Brief History, Exam, Impression, and Recommendations:    Lumbar degenerative disc disease Alan Soto is a very pleasant 37 year old male, he has known lumbar DDD, chronic low back pain, he is having worsening of radicular symptoms right worse than left to the great toe. We treated him for this in February 2023 with some steroids, he seemed to improve. We will treat him again with steroids, Neurontin this time, home conditioning and we will get an MRI, at this point he has failed greater than 6 weeks of home physical therapy.  Chronic fatigue Alan Soto does deal with some chronic fatigue, he had some labs ordered by Dr. Ashley Royalty that he has not yet had drawn yet, I will have him get these done today. He was also interested in consideration of supplements such as peptides, I advised that we did not have any evidence that this was highly beneficial, he also was interested in testosterone supplementation. He has done TRT in the past, he and his fiance are trying get pregnant and I did advise him that starting TRT would decrease his sperm counts and make him infertile. Due to his age I would like to add [facility] and LH to his labs to ensure were not dealing with hypogonadotrophic hypogonadism, if it does look like hypogonadotrophic hypogonadism we can supplement him with [facility] and LH and this would not ruin his sperm counts. In the future we should also consider mood disorder and sleep apnea.    ____________________________________________ Alan Soto. Alan Soto, M.D., ABFM., CAQSM., AME. Primary Care and Sports Medicine [facility]  Adjunct Professor of Family Medicine  [employer]  Restaurant manager, fast food

## 2022-08-08 NOTE — Assessment & Plan Note (Signed)
Alan Soto is a very pleasant 37 year old male, he has known lumbar DDD, chronic low back pain, he is having worsening of radicular symptoms right worse than left to the great toe. We treated him for this in February 2023 with some steroids, he seemed to improve. We will treat him again with steroids, Neurontin this time, home conditioning and we will get an MRI, at this point he has failed greater than 6 weeks of home physical therapy.

## 2022-08-09 ENCOUNTER — Encounter: Payer: Self-pay | Admitting: Family Medicine

## 2022-08-09 DIAGNOSIS — F988 Other specified behavioral and emotional disorders with onset usually occurring in childhood and adolescence: Secondary | ICD-10-CM

## 2022-08-09 LAB — CBC WITH DIFFERENTIAL/PLATELET
Absolute Monocytes: 468 cells/uL (ref 200–950)
Basophils Absolute: 151 cells/uL (ref 0–200)
Basophils Relative: 2.1 %
Eosinophils Absolute: 526 cells/uL — ABNORMAL HIGH (ref 15–500)
Eosinophils Relative: 7.3 %
HCT: 40.8 % (ref 38.5–50.0)
Hemoglobin: 13.1 g/dL — ABNORMAL LOW (ref 13.2–17.1)
Lymphs Abs: 2549 cells/uL (ref 850–3900)
MCH: 26.1 pg — ABNORMAL LOW (ref 27.0–33.0)
MCHC: 32.1 g/dL (ref 32.0–36.0)
MCV: 81.3 fL (ref 80.0–100.0)
MPV: 9.6 fL (ref 7.5–12.5)
Monocytes Relative: 6.5 %
Neutro Abs: 3506 cells/uL (ref 1500–7800)
Neutrophils Relative %: 48.7 %
Platelets: 331 10*3/uL (ref 140–400)
RBC: 5.02 10*6/uL (ref 4.20–5.80)
RDW: 13.9 % (ref 11.0–15.0)
Total Lymphocyte: 35.4 %
WBC: 7.2 10*3/uL (ref 3.8–10.8)

## 2022-08-09 LAB — COMPLETE METABOLIC PANEL WITH GFR
AG Ratio: 1.6 (calc) (ref 1.0–2.5)
ALT: 28 U/L (ref 9–46)
AST: 25 U/L (ref 10–40)
Albumin: 4.4 g/dL (ref 3.6–5.1)
Alkaline phosphatase (APISO): 50 U/L (ref 36–130)
BUN/Creatinine Ratio: 11 (calc) (ref 6–22)
BUN: 14 mg/dL (ref 7–25)
CO2: 26 mmol/L (ref 20–32)
Calcium: 9.7 mg/dL (ref 8.6–10.3)
Chloride: 103 mmol/L (ref 98–110)
Creat: 1.31 mg/dL — ABNORMAL HIGH (ref 0.60–1.26)
Globulin: 2.8 g/dL (calc) (ref 1.9–3.7)
Glucose, Bld: 84 mg/dL (ref 65–99)
Potassium: 4.5 mmol/L (ref 3.5–5.3)
Sodium: 138 mmol/L (ref 135–146)
Total Bilirubin: 0.4 mg/dL (ref 0.2–1.2)
Total Protein: 7.2 g/dL (ref 6.1–8.1)
eGFR: 72 mL/min/{1.73_m2} (ref >=60)

## 2022-08-09 LAB — IRON,TIBC AND FERRITIN PANEL
%SAT: 23 % (calc) (ref 20–48)
Ferritin: 183 ng/mL (ref 38–380)
Iron: 79 ug/dL (ref 50–180)
TIBC: 340 mcg/dL (calc) (ref 250–425)

## 2022-08-09 LAB — VITAMIN B12: Vitamin B-12: 462 pg/mL (ref 200–1100)

## 2022-08-09 LAB — FOLLICLE STIMULATING HORMONE: FSH: 6 m[IU]/mL (ref 1.4–12.8)

## 2022-08-09 LAB — LUTEINIZING HORMONE: LH: 3.5 m[IU]/mL (ref 1.5–9.3)

## 2022-08-09 LAB — TESTOSTERONE: Testosterone: 635 ng/dL (ref 250–827)

## 2022-08-09 LAB — TSH: TSH: 2.11 mIU/L (ref 0.40–4.50)

## 2022-08-09 LAB — VITAMIN D 25 HYDROXY (VIT D DEFICIENCY, FRACTURES): Vit D, 25-Hydroxy: 34 ng/mL (ref 30–100)

## 2022-08-09 MED ORDER — AMPHETAMINE-DEXTROAMPHET ER 25 MG PO CP24
25.0000 mg | ORAL_CAPSULE | ORAL | 0 refills | Status: DC
Start: 2022-08-09 — End: 2022-09-06

## 2022-08-09 MED ORDER — AMPHETAMINE-DEXTROAMPHETAMINE 10 MG PO TABS: 10.0000 mg | ORAL_TABLET | Freq: Every day | ORAL | 0 refills | Status: DC

## 2022-08-10 ENCOUNTER — Other Ambulatory Visit: Payer: Self-pay | Admitting: Family Medicine

## 2022-08-10 DIAGNOSIS — I10 Essential (primary) hypertension: Secondary | ICD-10-CM

## 2022-08-20 ENCOUNTER — Telehealth: Payer: Self-pay | Admitting: Family Medicine

## 2022-08-20 NOTE — Telephone Encounter (Signed)
Erie Noe from [facility] called on behalf of the patient. Stated the MRI order (from Dr.T) needs to be faxed over to [facility] in [place].   Fax number - [phone].

## 2022-08-21 NOTE — Telephone Encounter (Signed)
Task completed. Confirmed with [employer] that Berkley Harvey [id number], for 08/16/22 to 02/02/23 is still valid. Per request, order form, auth and insurance information faxed to [phone]. Referral information updated to reflect change of imaging location.

## 2022-08-25 LAB — BASIC METABOLIC PANEL
BUN: 14 mg/dL (ref 7–25)
CO2: 26 mmol/L (ref 20–32)
Calcium: 10.3 mg/dL (ref 8.6–10.3)
Chloride: 103 mmol/L (ref 98–110)
Creat: 1.23 mg/dL (ref 0.60–1.26)
Glucose, Bld: 81 mg/dL (ref 65–99)
Potassium: 4.4 mmol/L (ref 3.5–5.3)
Sodium: 139 mmol/L (ref 135–146)

## 2022-09-06 ENCOUNTER — Other Ambulatory Visit: Payer: Self-pay | Admitting: Family Medicine

## 2022-09-06 ENCOUNTER — Ambulatory Visit: Payer: Managed Care, Other (non HMO) | Admitting: Family Medicine

## 2022-09-06 ENCOUNTER — Encounter: Payer: Self-pay | Admitting: Family Medicine

## 2022-09-06 VITALS — BP 139/81 | HR 76 | Ht 73.0 in | Wt 242.0 lb

## 2022-09-06 DIAGNOSIS — J302 Other seasonal allergic rhinitis: Secondary | ICD-10-CM | POA: Diagnosis not present

## 2022-09-06 DIAGNOSIS — F988 Other specified behavioral and emotional disorders with onset usually occurring in childhood and adolescence: Secondary | ICD-10-CM | POA: Diagnosis not present

## 2022-09-06 DIAGNOSIS — J4521 Mild intermittent asthma with (acute) exacerbation: Secondary | ICD-10-CM

## 2022-09-06 DIAGNOSIS — I10 Essential (primary) hypertension: Secondary | ICD-10-CM

## 2022-09-06 DIAGNOSIS — Z8349 Family history of other endocrine, nutritional and metabolic diseases: Secondary | ICD-10-CM

## 2022-09-06 MED ORDER — "SYRINGE/NEEDLE (DISP) 23G X 1"" 3 ML MISC": 1 refills | Status: AC

## 2022-09-06 MED ORDER — PROAIR HFA 108 (90 BASE) MCG/ACT IN AERS
1.0000 | INHALATION_SPRAY | Freq: Four times a day (QID) | RESPIRATORY_TRACT | 5 refills | Status: DC | PRN
Start: 2022-09-06 — End: 2022-09-07

## 2022-09-06 MED ORDER — AMPHETAMINE-DEXTROAMPHET ER 25 MG PO CP24
25.0000 mg | ORAL_CAPSULE | ORAL | 0 refills | Status: DC
Start: 2022-09-06 — End: 2022-12-06

## 2022-09-06 MED ORDER — AMPHETAMINE-DEXTROAMPHET ER 25 MG PO CP24: 25.0000 mg | ORAL_CAPSULE | ORAL | 0 refills | Status: DC

## 2022-09-06 MED ORDER — AMPHETAMINE-DEXTROAMPHETAMINE 10 MG PO TABS: 10.0000 mg | ORAL_TABLET | Freq: Two times a day (BID) | ORAL | 0 refills | Status: DC

## 2022-09-06 MED ORDER — CYANOCOBALAMIN 1000 MCG/ML IJ SOLN: INTRAMUSCULAR | 11 refills | Status: DC

## 2022-09-06 MED ORDER — AMPHETAMINE-DEXTROAMPHETAMINE 10 MG PO TABS: ORAL_TABLET | ORAL | 0 refills | Status: DC

## 2022-09-06 MED ORDER — AZELASTINE HCL 0.1 % NA SOLN: 2.0000 | Freq: Two times a day (BID) | NASAL | 12 refills | Status: DC

## 2022-09-06 MED ORDER — AMPHETAMINE-DEXTROAMPHETAMINE 10 MG PO TABS: 10.0000 mg | ORAL_TABLET | Freq: Every day | ORAL | 0 refills | Status: DC

## 2022-09-07 NOTE — Telephone Encounter (Signed)
Per [facility] pharmacy - BRAND PROAIR NO LONGER AVAILABLE. PLEASE SEND IN ALTERNATIVE.

## 2022-09-09 DIAGNOSIS — Z8349 Family history of other endocrine, nutritional and metabolic diseases: Secondary | ICD-10-CM | POA: Insufficient documentation

## 2022-09-09 NOTE — Assessment & Plan Note (Signed)
Azelastine renewed.

## 2022-09-09 NOTE — Assessment & Plan Note (Signed)
Doing well with current medications of Adderall XR in the morning with an additional Adderall IR in the afternoon.  Stable at this time.

## 2022-09-09 NOTE — Progress Notes (Signed)
Alan Soto - 37 y.o. male MRN [id number]  Date of birth: 1985/12/26  Subjective Chief Complaint  Patient presents with   ADHD    HPI [age] year old male here today for follow-up visit.  Reports he is doing pretty well with current strength of the ADD medication.  Continues on Adderall XR 25 mg daily with an additional 10 mg as needed in the afternoon.  He has been dealing with some chronic pain especially in his lower back.  He is seeing her Thekkekandam for this.  Additionally, he has had some increased fatigue.  Previously on B12 supplement and his B12 levels have been on the lower end of normal previously.  He would like to try this back on to see if this helps his energy levels.  ROS:  A comprehensive ROS was completed and negative except as noted per HPI  Allergies  Allergen Reactions   Penicillins Shortness Of Breath and Anaphylaxis    Past Medical History:  Diagnosis Date   Asthma 02/27/1992   As per patient   Deviated septum 01/05/2016   Visit with ENT 01/12/2016: Turbinate reduction, septoplasty, bilateral maxillary antrostomy planned by ENT. 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   Esophageal reflux 06/22/2015   Status post EGD 06/03/2014. 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 Labs reviewed, there was 1 mention of abnormally low testosterone but other values had been normal. Labs done by myself were normal. Consider endocrine referral if patient wants to pursue this issue further     Past Surgical History:  Procedure Laterality Date   CHOLECYSTECTOMY  01/2015   SHOULDER SURGERY  11/2014    Social History   Socioeconomic History   Marital status: Divorced    Spouse name: Not on file   Number of children: Not on file   Years of education: Not on file   Highest education level: Not on file  Occupational History   Not on file  Tobacco Use   Smoking status: Never   Smokeless tobacco: Never  Substance and Sexual Activity   Alcohol use: Not on file   Drug use: Not on file   Sexual activity: Not on file  Other Topics Concern   Not on file  Social History Narrative   Not on file   Social Determinants of Health   Financial Resource Strain: Not on file  Food Insecurity: Not on file  Transportation Needs: Not on file  Physical Activity: Not on file  Stress: Not on file  Social Connections: Unknown (03/23/2022)   Received from [facility]   Social Network    Social Network: Not  on file    History reviewed. No pertinent family history.  Health Maintenance  Topic Date Due   COVID-19 Vaccine (3 - Pfizer risk series) 03/18/2023 (Originally 07/28/2019)   INFLUENZA VACCINE  05/27/2023 (Originally 09/27/2022)   Hepatitis C Screening  09/06/2023 (Originally 08/16/2002)   HIV Screening  09/06/2023 (Originally 08/16/1999)   DTaP/Tdap/Td (3 - Td or Tdap) 05/09/2028   HPV VACCINES  Aged Out      ----------------------------------------------------------------------------------------------------------------------------------------------------------------------------------------------------------------- Physical Exam BP 139/81 (BP Location: Right Arm, Patient Position: Sitting, Cuff Size: Large)   Pulse 76   Ht 6\' 1"  (1.854 m)   Wt 242 lb (109.8 kg)   SpO2 96%   BMI 31.93 kg/m   Physical Exam Constitutional:      Appearance: Normal appearance.  HENT:     Head: Normocephalic and atraumatic.  Eyes:     General: No scleral icterus. Cardiovascular:     Rate and Rhythm: Normal rate and regular rhythm.  Pulmonary:     Effort: Pulmonary effort is normal.     Breath sounds: Normal breath sounds.  Musculoskeletal:     Cervical back: Neck supple.  Neurological:     Mental Status: He is alert.  Psychiatric:        Mood and Affect: Mood normal.        Behavior: Behavior normal.     ------------------------------------------------------------------------------------------------------------------------------------------------------------------------------------------------------------------- Assessment and Plan  Essential hypertension Borderline blood pressure.  Will continue to monitor this closely.  Regular exercise and low-sodium diet encouraged.  Adult attention deficit disorder Doing well with current medications of Adderall XR in the morning with an additional Adderall IR in the afternoon.  Stable at this time.  Seasonal allergies Azelastine renewed.  Family history of B12 deficiency Will add B12 injection back on.  He feels comfortable doing this himself.   Meds ordered this encounter  Medications   amphetamine-dextroamphetamine (ADDERALL XR) 25 MG 24 hr capsule    Sig: Take 1 capsule by mouth every morning.    Dispense:  30 capsule    Refill:  0   amphetamine-dextroamphetamine (ADDERALL) 10 MG tablet    Sig: Take 1 tablet (10 mg total) by mouth daily.  Take in the afternoon as needed.    Dispense:  30 tablet    Refill:  0   azelastine (ASTELIN) 0.1 % nasal spray    Sig: Place 2 sprays into both nostrils 2 (two) times daily.    Dispense:  30 mL    Refill:  12   DISCONTD: PROAIR HFA 108 (90 Base) MCG/ACT inhaler    Sig: Inhale 1-2 puffs into the lungs every 6 (six) hours as needed for wheezing or shortness of breath.    Dispense:  18 g    Refill:  5   amphetamine-dextroamphetamine (ADDERALL XR) 25 MG 24 hr capsule    Sig: Take 1 capsule by mouth every morning.    Dispense:  30 capsule    Refill:  0    Fill in 30 days   amphetamine-dextroamphetamine (ADDERALL XR) 25 MG 24 hr capsule    Sig: Take 1 capsule by mouth every morning.    Dispense:  30 capsule    Refill:  0    Fill in 60 days.   amphetamine-dextroamphetamine (ADDERALL) 10 MG tablet    Sig: Take 1 tablet (10 mg total) by mouth 2 (two) times daily. Take in the afternoon as needed    Dispense:  30 tablet    Refill:  0    Fill  in 30 days   amphetamine-dextroamphetamine (ADDERALL) 10 MG tablet    Sig: Take 1 tab in the afternoon as needed.    Dispense:  30 tablet    Refill:  0    Fill in 60 days.   cyanocobalamin (VITAMIN B12) 1000 MCG/ML injection    Sig: Inject monthly    Dispense:  1 mL    Refill:  11   SYRINGE-NEEDLE, DISP, 3 ML 23G X 1" 3 ML MISC    Sig: Use to inject B12    Dispense:  25 each    Refill:  1    Return in about 6 months (around 03/09/2023) for ADHD.    This visit occurred during the SARS-CoV-2 public health emergency.  Safety protocols were in place, including screening questions prior to the visit, additional usage of staff PPE, and extensive cleaning of exam room while observing appropriate contact time as indicated for disinfecting solutions.

## 2022-09-09 NOTE — Assessment & Plan Note (Signed)
Will add B12 injection back on.  He feels comfortable doing this himself.

## 2022-09-09 NOTE — Assessment & Plan Note (Signed)
Borderline blood pressure.  Will continue to monitor this closely.  Regular exercise and low-sodium diet encouraged.

## 2022-09-20 ENCOUNTER — Encounter (INDEPENDENT_AMBULATORY_CARE_PROVIDER_SITE_OTHER): Payer: Managed Care, Other (non HMO) | Admitting: Sports Medicine

## 2022-09-20 DIAGNOSIS — M5136 Other intervertebral disc degeneration, lumbar region: Secondary | ICD-10-CM

## 2022-09-20 DIAGNOSIS — M51369 Other intervertebral disc degeneration, lumbar region without mention of lumbar back pain or lower extremity pain: Secondary | ICD-10-CM

## 2022-09-21 NOTE — Telephone Encounter (Signed)
I spent 5 total minutes of online digital evaluation and management services in this patient-initiated request for online care. 

## 2022-09-24 NOTE — Addendum Note (Signed)
Addended by: Monica Becton on: 09/24/2022 09:59 AM   Modules accepted: Orders

## 2022-09-27 ENCOUNTER — Encounter: Payer: Self-pay | Admitting: Sports Medicine

## 2022-10-04 NOTE — Discharge Instructions (Signed)

## 2022-10-05 ENCOUNTER — Ambulatory Visit
Admission: RE | Admit: 2022-10-05 | Discharge: 2022-10-05 | Disposition: A | Discharge status: Home / Self Care | Payer: Managed Care, Other (non HMO) | Source: Ambulatory Visit | Attending: Sports Medicine | Admitting: Sports Medicine

## 2022-10-05 DIAGNOSIS — M5136 Other intervertebral disc degeneration, lumbar region: Secondary | ICD-10-CM

## 2022-10-05 DIAGNOSIS — M51369 Other intervertebral disc degeneration, lumbar region without mention of lumbar back pain or lower extremity pain: Secondary | ICD-10-CM

## 2022-10-05 MED ORDER — METHYLPREDNISOLONE ACETATE 40 MG/ML INJ SUSP (RADIOLOG
80.0000 mg | Freq: Once | INTRAMUSCULAR | Status: AC
  Administered 2022-10-05: 80 mg via EPIDURAL

## 2022-10-05 MED ORDER — IOPAMIDOL (ISOVUE-M 200) INJECTION 41%
1.0000 mL | Freq: Once | INTRAMUSCULAR | Status: AC
  Administered 2022-10-05: 1 mL via EPIDURAL

## 2022-12-06 ENCOUNTER — Encounter: Payer: Self-pay | Admitting: Family Medicine

## 2022-12-06 DIAGNOSIS — F988 Other specified behavioral and emotional disorders with onset usually occurring in childhood and adolescence: Secondary | ICD-10-CM

## 2022-12-06 MED ORDER — AMPHETAMINE-DEXTROAMPHET ER 25 MG PO CP24: 25.0000 mg | ORAL_CAPSULE | ORAL | 0 refills | Status: DC

## 2022-12-06 MED ORDER — AMPHETAMINE-DEXTROAMPHETAMINE 10 MG PO TABS: ORAL_TABLET | ORAL | 0 refills | Status: DC

## 2022-12-06 MED ORDER — AMPHETAMINE-DEXTROAMPHETAMINE 10 MG PO TABS
10.0000 mg | ORAL_TABLET | Freq: Every day | ORAL | 0 refills | Status: DC
Start: 2022-12-06 — End: 2023-05-13

## 2022-12-06 MED ORDER — AMPHETAMINE-DEXTROAMPHETAMINE 10 MG PO TABS: 10.0000 mg | ORAL_TABLET | Freq: Two times a day (BID) | ORAL | 0 refills | Status: DC

## 2022-12-06 MED ORDER — AMPHETAMINE-DEXTROAMPHET ER 25 MG PO CP24
25.0000 mg | ORAL_CAPSULE | ORAL | 0 refills | Status: DC
Start: 2022-12-06 — End: 2023-01-03

## 2022-12-06 NOTE — Telephone Encounter (Signed)
Completed.   CM

## 2023-01-03 ENCOUNTER — Ambulatory Visit: Payer: Managed Care, Other (non HMO) | Admitting: Family Medicine

## 2023-01-03 ENCOUNTER — Encounter: Payer: Self-pay | Admitting: Family Medicine

## 2023-01-03 VITALS — BP 147/87 | HR 63 | Temp 98.1°F | Ht 73.0 in | Wt 238.0 lb

## 2023-01-03 DIAGNOSIS — F988 Other specified behavioral and emotional disorders with onset usually occurring in childhood and adolescence: Secondary | ICD-10-CM

## 2023-01-03 DIAGNOSIS — R5382 Chronic fatigue, unspecified: Secondary | ICD-10-CM

## 2023-01-03 DIAGNOSIS — I10 Essential (primary) hypertension: Secondary | ICD-10-CM

## 2023-01-03 DIAGNOSIS — E669 Obesity, unspecified: Secondary | ICD-10-CM | POA: Insufficient documentation

## 2023-01-03 DIAGNOSIS — E6609 Other obesity due to excess calories: Secondary | ICD-10-CM

## 2023-01-03 DIAGNOSIS — E66811 Obesity, class 1: Secondary | ICD-10-CM

## 2023-01-03 DIAGNOSIS — Z6831 Body mass index (BMI) 31.0-31.9, adult: Secondary | ICD-10-CM

## 2023-01-03 MED ORDER — AMBULATORY NON FORMULARY MEDICATION: 3 refills | Status: DC

## 2023-01-03 MED ORDER — LISDEXAMFETAMINE DIMESYLATE 50 MG PO CAPS
50.0000 mg | ORAL_CAPSULE | Freq: Every day | ORAL | 0 refills | Status: DC
Start: 2023-01-03 — End: 2023-01-07

## 2023-01-03 NOTE — Assessment & Plan Note (Signed)
Will add compounded semaglutide.  Rx faxed to [facility].

## 2023-01-03 NOTE — Patient Instructions (Addendum)
[facility] [address] [place], [place] [address] Phone: [phone]

## 2023-01-03 NOTE — Progress Notes (Signed)
Alan Soto - 37 y.o. male MRN [id number]  Date of birth: 19-Feb-1986  Subjective Chief Complaint  Patient presents with   Medical Management of Chronic Issues    Med refills Mood  Phq gad      HPI Alan Soto is a 37 y.o. male here today for follow up visit.    ADHD currently treated with Adderall xr with an additional adderall in the afternoon.  Previously on [name] and reports that this seemed to work better for him however there was trouble finding this in stock.  He does not recall side effects with medication.    He is interested in adding GLP-1 to help with managing weight and appetite.  His insurance doesn't cover GLP-1 medications for weight management.  He has looked at compounding solutions but wanted to know if there was anywhere locally. He does workout regularly but craves sweet things.    ROS:  A comprehensive ROS was completed and negative except as noted per HPI  Allergies  Allergen Reactions   Penicillins Shortness Of Breath and Anaphylaxis    Past Medical History:  Diagnosis Date   Asthma 02/27/1992   As per patient   Deviated septum 01/05/2016   Visit with ENT 01/12/2016: Turbinate reduction, septoplasty, bilateral maxillary antrostomy planned by ENT. 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   Esophageal reflux 06/22/2015   Status post EGD 06/03/2014. 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 Labs reviewed, there was 1 mention of abnormally low testosterone but other values had been normal. Labs done by myself were normal. Consider endocrine referral if patient wants to pursue this issue further     Past Surgical History:  Procedure Laterality Date   CHOLECYSTECTOMY  01/2015   SHOULDER SURGERY  11/2014    Social History   Socioeconomic History   Marital status: Divorced    Spouse name: Not on file   Number of children: Not on file   Years of education: Not on file   Highest education level: Not on file  Occupational History   Not on file  Tobacco Use   Smoking status: Never   Smokeless tobacco: Never  Substance and Sexual Activity   Alcohol use: Not on file   Drug use: Not on file   Sexual activity: Not on file  Other Topics Concern   Not on file  Social History Narrative   Not on file   Social Determinants of Health   Financial Resource Strain: Not on file  Food Insecurity: Not on file  Transportation Needs: Not on file  Physical Activity: Not on file  Stress: Not  on file  Social Connections: Unknown (03/23/2022)   Received from [facility]   Social Network    Social Network: Not on file    No family history on file.  Health Maintenance  Topic Date Due   COVID-19 Vaccine (3 - Pfizer risk series) 03/18/2023 (Originally 07/28/2019)   INFLUENZA VACCINE  05/27/2023 (Originally 09/27/2022)   Hepatitis C Screening  09/06/2023 (Originally 08/16/2002)   HIV Screening  09/06/2023 (Originally 08/16/1999)   DTaP/Tdap/Td (3 - Td or Tdap) 05/09/2028   HPV VACCINES   Aged Out     ----------------------------------------------------------------------------------------------------------------------------------------------------------------------------------------------------------------- Physical Exam BP (!) 147/87   Pulse 63   Temp 98.1 F (36.7 C) (Oral)   Ht 6\' 1"  (1.854 m)   Wt 238 lb (108 kg)   SpO2 98%   BMI 31.40 kg/m   Physical Exam Constitutional:      Appearance: Normal appearance.  HENT:     Head: Normocephalic and atraumatic.  Eyes:     General: No scleral icterus. Cardiovascular:     Rate and Rhythm: Normal rate and regular rhythm.  Pulmonary:     Effort: Pulmonary effort is normal.     Breath sounds: Normal breath sounds.  Musculoskeletal:     Cervical back: Neck supple.  Neurological:     Mental Status: He is alert.  Psychiatric:        Mood and Affect: Mood normal.        Behavior: Behavior normal.     ------------------------------------------------------------------------------------------------------------------------------------------------------------------------------------------------------------------- Assessment and Plan  Adult attention deficit disorder Restarting vyvanse at last dose of 50mg  daily  May continue adderall 10mg  in the afternoon as needed.  F/u in 3 months.   Chronic fatigue B12 low on lab work and currently doing injections which do seem to be making a difference.  Will continue.   Essential hypertension Borderline blood pressure.  Will continue to monitor this closely.  Regular exercise and low-sodium diet encouraged.  Obesity Will add compounded semaglutide.  Rx faxed to [facility].     Meds ordered this encounter  Medications   lisdexamfetamine ([name]) 50 MG capsule    Sig: Take 1 capsule (50 mg total) by mouth daily.    Dispense:  30 capsule    Refill:  0   AMBULATORY NON FORMULARY MEDICATION    Sig: Semaglutide 2.5 mg/mL + Vit B6 10mg /mL.   Inject 10 units/0.25  mg subcutaneous weekly for 4 weeks then 20 units/0.5 mg subcutaneous weekly for 4 weeks, then 40 units/1 mg weekly.    Dispense:  2 mL    Refill:  3    Return in about 3 months (around 04/05/2023) for ADHD,F/u weight mgmt.    This visit occurred during the SARS-CoV-2 public health emergency.  Safety protocols were in place, including screening questions prior to the visit, additional usage of staff PPE, and extensive cleaning of exam room while observing appropriate contact time as indicated for disinfecting solutions.

## 2023-01-03 NOTE — Assessment & Plan Note (Signed)
Restarting vyvanse at last dose of 50mg  daily  May continue adderall 10mg  in the afternoon as needed.  F/u in 3 months.

## 2023-01-03 NOTE — Assessment & Plan Note (Signed)
B12 low on lab work and currently doing injections which do seem to be making a difference.  Will continue.

## 2023-01-03 NOTE — Assessment & Plan Note (Signed)
Borderline blood pressure.  Will continue to monitor this closely.  Regular exercise and low-sodium diet encouraged.

## 2023-01-06 ENCOUNTER — Encounter: Payer: Self-pay | Admitting: Family Medicine

## 2023-01-06 DIAGNOSIS — F988 Other specified behavioral and emotional disorders with onset usually occurring in childhood and adolescence: Secondary | ICD-10-CM

## 2023-01-07 MED ORDER — LISDEXAMFETAMINE DIMESYLATE 50 MG PO CAPS
50.0000 mg | ORAL_CAPSULE | Freq: Every day | ORAL | 0 refills | Status: DC
Start: 2023-01-07 — End: 2023-02-12

## 2023-01-14 ENCOUNTER — Ambulatory Visit: Payer: Managed Care, Other (non HMO) | Admitting: Sports Medicine

## 2023-01-14 ENCOUNTER — Encounter: Payer: Self-pay | Admitting: Sports Medicine

## 2023-01-14 DIAGNOSIS — M79661 Pain in right lower leg: Secondary | ICD-10-CM | POA: Diagnosis not present

## 2023-01-14 DIAGNOSIS — M5136 Other intervertebral disc degeneration, lumbar region with discogenic back pain only: Secondary | ICD-10-CM | POA: Diagnosis not present

## 2023-01-14 DIAGNOSIS — M79662 Pain in left lower leg: Secondary | ICD-10-CM

## 2023-01-14 MED ORDER — GABAPENTIN 300 MG PO CAPS
ORAL_CAPSULE | ORAL | 3 refills | Status: DC
Start: 2023-01-14 — End: 2023-06-05

## 2023-01-14 NOTE — Assessment & Plan Note (Signed)
Known lumbar DDD. He did have an epidural that did not provide any relief, in fact potentially worsened his symptomatology. He did okay on gabapentin so we will restart this. Not interested in surgery. Also adding some advanced herniated disc conditioning exercises.

## 2023-01-14 NOTE — Assessment & Plan Note (Signed)
Persistent pain now for a couple of years posterior calf, soleus and musculotendinous junction of the gastrocnemius. He is a runner. Occurs at about the same distance every time. Seems separate from the pain he experiences in his back. No numbness or tingling associated. Venous ultrasound has been negative. 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 We going to restart gabapentin for his back, he will also do some back conditioning and if insufficient efficacy I will go ahead and get him set up for bilateral pre and post exertional compartment pressure testing.

## 2023-01-14 NOTE — Progress Notes (Signed)
    Procedures performed today:    None.  Independent interpretation of notes and tests performed by another provider:   None.  Brief History, Exam, Impression, and Recommendations:    Lumbar degenerative disc disease Known lumbar DDD. He did have an epidural that did not provide any relief, in fact potentially worsened his symptomatology. He did okay on gabapentin so we will restart this. Not interested in surgery. Also adding some advanced herniated disc conditioning exercises.  Bilateral calf pain Persistent pain now for a couple of years posterior calf, soleus and musculotendinous junction of the gastrocnemius. He is a runner. Occurs at about the same distance every time. Seems separate from the pain he experiences in his back. No numbness or tingling associated. Venous ultrasound has been negative. 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 We going to restart gabapentin for his back, he will also do some back conditioning and if insufficient efficacy I will go ahead and get him set up for bilateral pre and post exertional compartment pressure testing.    ____________________________________________ [name]. Benjamin Stain, M.D., ABFM., CAQSM., AME. Primary Care and Sports Medicine [facility]  Adjunct Professor of Family Medicine  [employer]  Restaurant manager, fast food

## 2023-02-02 ENCOUNTER — Other Ambulatory Visit: Payer: Self-pay | Admitting: Sports Medicine

## 2023-02-02 DIAGNOSIS — M7989 Other specified soft tissue disorders: Secondary | ICD-10-CM

## 2023-02-12 ENCOUNTER — Encounter: Payer: Self-pay | Admitting: Family Medicine

## 2023-02-12 ENCOUNTER — Encounter: Payer: Self-pay | Admitting: Sports Medicine

## 2023-02-12 DIAGNOSIS — F988 Other specified behavioral and emotional disorders with onset usually occurring in childhood and adolescence: Secondary | ICD-10-CM

## 2023-02-12 MED ORDER — LISDEXAMFETAMINE DIMESYLATE 50 MG PO CAPS
50.0000 mg | ORAL_CAPSULE | Freq: Every day | ORAL | 0 refills | Status: DC
Start: 2023-02-12 — End: 2023-03-13

## 2023-03-13 ENCOUNTER — Encounter: Payer: Self-pay | Admitting: Family Medicine

## 2023-03-13 DIAGNOSIS — F988 Other specified behavioral and emotional disorders with onset usually occurring in childhood and adolescence: Secondary | ICD-10-CM

## 2023-03-13 MED ORDER — LISDEXAMFETAMINE DIMESYLATE 50 MG PO CAPS
50.0000 mg | ORAL_CAPSULE | Freq: Every day | ORAL | 0 refills | Status: DC
Start: 2023-03-13 — End: 2023-04-12

## 2023-03-13 MED ORDER — AMPHETAMINE-DEXTROAMPHETAMINE 10 MG PO TABS: ORAL_TABLET | ORAL | 0 refills | Status: DC

## 2023-03-20 ENCOUNTER — Ambulatory Visit: Payer: Managed Care, Other (non HMO) | Admitting: Family Medicine

## 2023-03-20 ENCOUNTER — Encounter: Payer: Self-pay | Admitting: Family Medicine

## 2023-03-20 VITALS — BP 123/69 | HR 79 | Ht 73.0 in | Wt 220.0 lb

## 2023-03-20 DIAGNOSIS — Z6831 Body mass index (BMI) 31.0-31.9, adult: Secondary | ICD-10-CM | POA: Diagnosis not present

## 2023-03-20 DIAGNOSIS — R490 Dysphonia: Secondary | ICD-10-CM

## 2023-03-20 DIAGNOSIS — E66811 Obesity, class 1: Secondary | ICD-10-CM | POA: Diagnosis not present

## 2023-03-20 DIAGNOSIS — E6609 Other obesity due to excess calories: Secondary | ICD-10-CM

## 2023-03-20 MED ORDER — AMBULATORY NON FORMULARY MEDICATION: 5 refills | Status: AC

## 2023-03-20 MED ORDER — PREDNISONE 50 MG PO TABS: ORAL_TABLET | ORAL | 0 refills | Status: DC

## 2023-03-20 NOTE — Assessment & Plan Note (Signed)
He is doing well with compounded semaglutide.  Increasing to 60 units (1.5mg ) weekly.  Updated rx sent in.

## 2023-03-20 NOTE — Assessment & Plan Note (Signed)
Likely viral in nature.  Will add prednisone burst.

## 2023-03-20 NOTE — Progress Notes (Signed)
Alan Soto - 38 y.o. male MRN [id number]  Date of birth: May 20, 1985  Subjective Chief Complaint  Patient presents with   Weight Loss    HPI Alan Soto is a 38 y.o. male here today for follow up of weight management.  He has been using compounded semaglutide and has done pretty well with this.  Weight is down about 25 lbs overall with medication as well as diet and exercise changes.  He is tolerating well overall.  He would like to try a slight increase in strength.   He has also had some hoarseness over the past few days.  Has some congestion and drainage.  Denies fever or chills.   ROS:  A comprehensive ROS was completed and negative except as noted per HPI  Allergies  Allergen Reactions   Penicillins Shortness Of Breath and Anaphylaxis    Past Medical History:  Diagnosis Date   Asthma 02/27/1992   As per patient   Deviated septum 01/05/2016   Visit with ENT 01/12/2016: Turbinate reduction, septoplasty, bilateral maxillary antrostomy planned by ENT. 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   Esophageal reflux 06/22/2015   Status post EGD 06/03/2014. 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 Labs reviewed, there was 1 mention of abnormally low testosterone but other values had been normal. Labs done by myself were normal. Consider endocrine referral if patient wants to pursue this issue further     Past Surgical History:  Procedure Laterality Date   CHOLECYSTECTOMY  01/2015   SHOULDER SURGERY  11/2014    Social History   Socioeconomic History   Marital status: Married    Spouse name: Not on file   Number of children: Not on file   Years of education: Not on file   Highest education level: Bachelor's degree (e.g., BA, AB, BS)  Occupational History   Not on file  Tobacco Use   Smoking status: Never   Smokeless tobacco: Never  Substance and Sexual Activity   Alcohol use: Not on file   Drug use: Not on file   Sexual activity: Not on file  Other Topics Concern   Not on file  Social History Narrative   Not on file   Social Drivers of Health   Financial Resource Strain: Low Risk  (03/13/2023)   Overall Financial Resource Strain (CARDIA)    Difficulty of Paying Living Expenses: Not hard at all  Food Insecurity: No Food Insecurity (03/13/2023)   Hunger Vital Sign    Worried About Running Out of Food in the Last Year: Never true    Ran Out of Food in the  Last Year: Never true  Transportation Needs: No Transportation Needs (03/13/2023)   PRAPARE - Administrator, Civil Service (Medical): No    Lack of Transportation (Non-Medical): No  Physical Activity: Sufficiently Active (03/13/2023)   Exercise Vital Sign    Days of Exercise per Week: 6 days    Minutes of Exercise per Session: 90 min  Stress: No Stress Concern Present (03/13/2023)   [employer] of Occupational Health - Occupational Stress Questionnaire    Feeling of Stress : Not at all  Social Connections: Moderately Integrated  (03/13/2023)   Social Connection and Isolation Panel [NHANES]    Frequency of Communication with Friends and Family: More than three times a week    Frequency of Social Gatherings with Friends and Family: Patient declined    Attends Religious Services: More than 4 times per year    Active Member of [employer] or Organizations: No    Attends Engineer, structural: Not on file    Marital Status: Married    History reviewed. No pertinent family history.  Health Maintenance  Topic Date Due   INFLUENZA VACCINE  05/27/2023 (Originally 09/27/2022)   Hepatitis C Screening  09/06/2023 (Originally 08/16/2002)   HIV Screening  09/06/2023 (Originally 08/16/1999)   Pneumococcal Vaccine 36-38 Years old (1 of 2 - PCV) 03/19/2024 (Originally 08/16/1990)   COVID-19 Vaccine (3 - Pfizer risk series) 04/04/2024 (Originally 07/28/2019)   DTaP/Tdap/Td (3 - Td or Tdap) 05/09/2028   HPV VACCINES  Aged Out     ----------------------------------------------------------------------------------------------------------------------------------------------------------------------------------------------------------------- Physical Exam BP 123/69 (BP Location: Left Arm, Patient Position: Sitting, Cuff Size: Large)   Pulse 79   Ht 6\' 1"  (1.854 m)   Wt 220 lb (99.8 kg)   SpO2 98%   BMI 29.03 kg/m   Physical Exam Constitutional:      Appearance: Normal appearance.  Cardiovascular:     Rate and Rhythm: Normal rate and regular rhythm.  Pulmonary:     Effort: Pulmonary effort is normal.     Breath sounds: Normal breath sounds.  Neurological:     General: No focal deficit present.     Mental Status: He is alert.  Psychiatric:        Mood and Affect: Mood normal.        Behavior: Behavior normal.      ------------------------------------------------------------------------------------------------------------------------------------------------------------------------------------------------------------------- Assessment and Plan  Hoarseness Likely viral in nature.  Will add prednisone burst.   Obesity He is doing well with compounded semaglutide.  Increasing to 60 units (1.5mg ) weekly.  Updated rx sent in.     Meds ordered this encounter  Medications   predniSONE (DELTASONE) 50 MG tablet    Sig: Take 1 tab po daily x5 days.    Dispense:  5 tablet    Refill:  0   AMBULATORY NON FORMULARY MEDICATION    Sig: Semaglutide 2.5 mg/mL + Vit B6 10mg /mL.   Inject 60 units (1.5mg ) weekly    Dispense:  2 mL    Refill:  5    Return in about 3 months (around 06/18/2023) for weight management.    This visit occurred during the SARS-CoV-2 public health emergency.  Safety protocols were in place, including screening questions prior to the visit, additional usage of staff PPE, and extensive cleaning of exam room while observing appropriate contact time as indicated for disinfecting solutions.

## 2023-04-12 ENCOUNTER — Encounter: Payer: Self-pay | Admitting: Family Medicine

## 2023-04-12 DIAGNOSIS — F988 Other specified behavioral and emotional disorders with onset usually occurring in childhood and adolescence: Secondary | ICD-10-CM

## 2023-04-14 MED ORDER — LISDEXAMFETAMINE DIMESYLATE 50 MG PO CAPS: 50.0000 mg | ORAL_CAPSULE | Freq: Every day | ORAL | 0 refills | Status: DC

## 2023-04-14 MED ORDER — AMPHETAMINE-DEXTROAMPHETAMINE 10 MG PO TABS: ORAL_TABLET | ORAL | 0 refills | Status: DC

## 2023-05-13 ENCOUNTER — Encounter: Payer: Self-pay | Admitting: Family Medicine

## 2023-05-13 DIAGNOSIS — F988 Other specified behavioral and emotional disorders with onset usually occurring in childhood and adolescence: Secondary | ICD-10-CM

## 2023-05-13 NOTE — Telephone Encounter (Signed)
 Requesting rx rf of Adderall 10mg   Last written 04/14/2023 [name] 50mg  capsule  Last written 04/14/2023 Last OV 03/20/2023 Upcoming appt = none Please also see patient note attached regarding wound.

## 2023-05-14 MED ORDER — LISDEXAMFETAMINE DIMESYLATE 50 MG PO CAPS: 50.0000 mg | ORAL_CAPSULE | Freq: Every day | ORAL | 0 refills | Status: DC

## 2023-05-14 MED ORDER — AMPHETAMINE-DEXTROAMPHETAMINE 10 MG PO TABS: 10.0000 mg | ORAL_TABLET | Freq: Every day | ORAL | 0 refills | Status: DC

## 2023-05-15 ENCOUNTER — Ambulatory Visit: Admitting: Family Medicine

## 2023-05-15 ENCOUNTER — Encounter: Payer: Self-pay | Admitting: Family Medicine

## 2023-05-15 VITALS — BP 129/71 | HR 67 | Ht 73.0 in | Wt 206.0 lb

## 2023-05-15 DIAGNOSIS — L03115 Cellulitis of right lower limb: Secondary | ICD-10-CM

## 2023-05-15 DIAGNOSIS — L0390 Cellulitis, unspecified: Secondary | ICD-10-CM | POA: Insufficient documentation

## 2023-05-15 MED ORDER — DOXYCYCLINE HYCLATE 100 MG PO TABS: 100.0000 mg | ORAL_TABLET | Freq: Two times a day (BID) | ORAL | 0 refills | Status: DC

## 2023-05-15 NOTE — Assessment & Plan Note (Signed)
 Continue to keep area clean.  Adding course of doxycycline x10 days.  He will let me know if not resolving with this.

## 2023-05-15 NOTE — Progress Notes (Signed)
 Alan Soto - 38 y.o. male MRN [id number]  Date of birth: 1985/08/05  Subjective Chief Complaint  Patient presents with   Extremity Laceration    HPI Rashed R. Macdonell is a 38 y.o. male here today with complaint of possible wound infection.  He cut his leg at the gym while doing plyo box jumps.  Areas were healing routinely however area on lower R shin has become more erythematous, warm and somewhat tender.  He has tried neosporin without much improvment.  He denies fever or chills.    ROS:  A comprehensive ROS was completed and negative except as noted per HPI  Allergies  Allergen Reactions   Penicillins Shortness Of Breath and Anaphylaxis    Past Medical History:  Diagnosis Date   Asthma 02/27/1992   As per patient   Deviated septum 01/05/2016   Visit with ENT 01/12/2016: Turbinate reduction, septoplasty, bilateral maxillary antrostomy planned by ENT. 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   Esophageal reflux 06/22/2015   Status post EGD 06/03/2014. 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 Labs reviewed, there was 1 mention of abnormally low testosterone but other values had been normal. Labs done by myself were normal. Consider endocrine referral if patient wants to pursue this issue further     Past Surgical History:  Procedure Laterality Date   CHOLECYSTECTOMY  01/2015   SHOULDER SURGERY  11/2014    Social History   Socioeconomic History   Marital status: Married    Spouse name: Not on file   Number of children: Not on file   Years of education: Not on file   Highest education level: Bachelor's degree (e.g., BA, AB, BS)  Occupational History   Not on file  Tobacco Use   Smoking status: Never   Smokeless tobacco: Never  Substance and Sexual Activity   Alcohol use: Not on file   Drug use: Not on file   Sexual activity: Not on file  Other Topics Concern   Not on file  Social History Narrative   Not on file   Social Drivers of Health   Financial Resource Strain: Low Risk  (03/13/2023)   Overall Financial Resource Strain (CARDIA)    Difficulty of Paying Living Expenses: Not hard at all  Food Insecurity: No Food Insecurity (03/13/2023)   Hunger Vital Sign    Worried About Running Out of Food in the Last Year: Never true    Ran Out of Food in the Last Year: Never true  Transportation Needs: No Transportation Needs (03/13/2023)   PRAPARE - Administrator, Civil Service (Medical): No  Lack of Transportation (Non-Medical): No  Physical Activity: Sufficiently Active (03/13/2023)   Exercise Vital Sign    Days of Exercise per Week: 6 days    Minutes of Exercise per Session: 90 min  Stress: No Stress Concern Present (03/13/2023)   [employer] of Occupational Health - Occupational Stress Questionnaire    Feeling of Stress : Not at all  Social Connections: Moderately Integrated (03/13/2023)   Social Connection and Isolation Panel [NHANES]    Frequency of Communication with Friends and Family:  More than three times a week    Frequency of Social Gatherings with Friends and Family: Patient declined    Attends Religious Services: More than 4 times per year    Active Member of [employer] or Organizations: No    Attends Engineer, structural: Not on file    Marital Status: Married    History reviewed. No pertinent family history.  Health Maintenance  Topic Date Due   INFLUENZA VACCINE  05/27/2023 (Originally 09/27/2022)   Hepatitis C Screening  09/06/2023 (Originally 08/16/2002)   HIV Screening  09/06/2023 (Originally 08/16/1999)   Pneumococcal Vaccine 8-38 Years old (1 of 2 - PCV) 03/19/2024 (Originally 08/16/1990)   COVID-19 Vaccine (3 - Pfizer risk series) 04/04/2024 (Originally 07/28/2019)   DTaP/Tdap/Td (3 - Td or Tdap) 05/09/2028   HPV VACCINES  Aged Out     ----------------------------------------------------------------------------------------------------------------------------------------------------------------------------------------------------------------- Physical Exam BP 129/71 (BP Location: Left Arm, Patient Position: Sitting, Cuff Size: Normal)   Pulse 67   Ht 6\' 1"  (1.854 m)   Wt 206 lb (93.4 kg)   SpO2 99%   BMI 27.18 kg/m   Physical Exam Constitutional:      Appearance: Normal appearance.  Eyes:     General: No scleral icterus. Skin:    Comments: Healing laceration on R lower extremity.  Lower laceration with surrounding erythema, tenderness and mild warmth.   There is no drainage from this area.   Neurological:     Mental Status: He is alert.     ------------------------------------------------------------------------------------------------------------------------------------------------------------------------------------------------------------------- Assessment and Plan  Cellulitis Continue to keep area clean.  Adding course of doxycycline x10 days.  He will let me know if not resolving with this.     Meds ordered this encounter   Medications   doxycycline (VIBRA-TABS) 100 MG tablet    Sig: Take 1 tablet (100 mg total) by mouth 2 (two) times daily.    Dispense:  20 tablet    Refill:  0    No follow-ups on file.    This visit occurred during the SARS-CoV-2 public health emergency.  Safety protocols were in place, including screening questions prior to the visit, additional usage of staff PPE, and extensive cleaning of exam room while observing appropriate contact time as indicated for disinfecting solutions.

## 2023-05-16 MED ORDER — LISDEXAMFETAMINE DIMESYLATE 60 MG PO CAPS: 60.0000 mg | ORAL_CAPSULE | ORAL | 0 refills | Status: DC

## 2023-06-05 ENCOUNTER — Ambulatory Visit

## 2023-06-05 ENCOUNTER — Ambulatory Visit: Admitting: Sports Medicine

## 2023-06-05 DIAGNOSIS — M5136 Other intervertebral disc degeneration, lumbar region with discogenic back pain only: Secondary | ICD-10-CM | POA: Diagnosis not present

## 2023-06-05 DIAGNOSIS — M545 Low back pain, unspecified: Secondary | ICD-10-CM

## 2023-06-05 MED ORDER — HYDROCODONE-ACETAMINOPHEN 5-325 MG PO TABS: 1.0000 | ORAL_TABLET | Freq: Three times a day (TID) | ORAL | 0 refills | Status: AC | PRN

## 2023-06-05 MED ORDER — PREDNISONE 50 MG PO TABS: ORAL_TABLET | ORAL | 0 refills | Status: DC

## 2023-06-05 MED ORDER — GABAPENTIN 800 MG PO TABS: ORAL_TABLET | ORAL | 3 refills | Status: AC

## 2023-06-05 NOTE — Progress Notes (Signed)
    Procedures performed today:    None.  Independent interpretation of notes and tests performed by another provider:   None.  Brief History, Exam, Impression, and Recommendations:    Lumbar degenerative disc disease Very pleasant 38 year old male, known lumbar DDD, he did well with gabapentin in the past, did not respond to an epidural. More recently he is having increasing pain in the mid lumbar spine, burning sensation but nothing radicular, no red flag symptoms. He is having a trip coming up to [facility] and would like some aggressive treatment today, adding 5 days of steroids, hydrocodone for breakthrough pain, he is currently taking gabapentin 900 mg twice daily, he is running out of his 300 mg capsules so we will replace them with 800 mg capsules. He will restart his home conditioning, and return to see me in 4 to 6 weeks as needed.    ____________________________________________ [name]. Benjamin Stain, M.D., ABFM., CAQSM., AME. Primary Care and Sports Medicine [facility]  Adjunct Professor of Family Medicine  [place] [employer]  Restaurant manager, fast food

## 2023-06-05 NOTE — Assessment & Plan Note (Signed)
 Very pleasant 38 year old male, known lumbar DDD, he did well with gabapentin in the past, did not respond to an epidural. More recently he is having increasing pain in the mid lumbar spine, burning sensation but nothing radicular, no red flag symptoms. He is having a trip coming up to [facility] and would like some aggressive treatment today, adding 5 days of steroids, hydrocodone for breakthrough pain, he is currently taking gabapentin 900 mg twice daily, he is running out of his 300 mg capsules so we will replace them with 800 mg capsules. He will restart his home conditioning, and return to see me in 4 to 6 weeks as needed.

## 2023-06-12 ENCOUNTER — Other Ambulatory Visit: Payer: Self-pay

## 2023-06-12 DIAGNOSIS — M7989 Other specified soft tissue disorders: Secondary | ICD-10-CM

## 2023-06-12 MED ORDER — DICLOFENAC SODIUM 75 MG PO TBEC: 75.0000 mg | DELAYED_RELEASE_TABLET | Freq: Two times a day (BID) | ORAL | 3 refills | Status: DC

## 2023-07-07 ENCOUNTER — Encounter: Payer: Self-pay | Admitting: Sports Medicine

## 2023-07-08 ENCOUNTER — Encounter: Payer: Self-pay | Admitting: Family Medicine

## 2023-07-08 MED ORDER — TIRZEPATIDE 10 MG/0.5ML ~~LOC~~ SOAJ: SUBCUTANEOUS | 11 refills | Status: AC

## 2023-07-10 MED ORDER — LISDEXAMFETAMINE DIMESYLATE 60 MG PO CAPS: 60.0000 mg | ORAL_CAPSULE | ORAL | 0 refills | Status: DC

## 2023-07-10 MED ORDER — AMPHETAMINE-DEXTROAMPHETAMINE 10 MG PO TABS: ORAL_TABLET | ORAL | 0 refills | Status: DC

## 2023-08-03 ENCOUNTER — Other Ambulatory Visit: Payer: Self-pay | Admitting: Family Medicine

## 2023-08-12 ENCOUNTER — Encounter: Payer: Self-pay | Admitting: Family Medicine

## 2023-08-12 MED ORDER — LISDEXAMFETAMINE DIMESYLATE 60 MG PO CAPS: 60.0000 mg | ORAL_CAPSULE | ORAL | 0 refills | Status: DC

## 2023-08-12 MED ORDER — AMPHETAMINE-DEXTROAMPHETAMINE 10 MG PO TABS: ORAL_TABLET | ORAL | 0 refills | Status: DC

## 2023-09-30 ENCOUNTER — Encounter: Payer: Self-pay | Admitting: Family Medicine

## 2023-09-30 DIAGNOSIS — F988 Other specified behavioral and emotional disorders with onset usually occurring in childhood and adolescence: Secondary | ICD-10-CM

## 2023-09-30 MED ORDER — AMPHETAMINE-DEXTROAMPHETAMINE 10 MG PO TABS: ORAL_TABLET | ORAL | 0 refills | Status: DC

## 2023-09-30 MED ORDER — LISDEXAMFETAMINE DIMESYLATE 50 MG PO CAPS
50.0000 mg | ORAL_CAPSULE | Freq: Every day | ORAL | 0 refills | Status: DC
Start: 2023-09-30 — End: 2023-10-09

## 2023-10-03 ENCOUNTER — Other Ambulatory Visit: Payer: Self-pay | Admitting: Sports Medicine

## 2023-10-03 DIAGNOSIS — M7989 Other specified soft tissue disorders: Secondary | ICD-10-CM

## 2023-10-09 ENCOUNTER — Encounter: Payer: Self-pay | Admitting: Family Medicine

## 2023-10-09 ENCOUNTER — Ambulatory Visit: Admitting: Family Medicine

## 2023-10-09 VITALS — BP 121/65 | HR 77 | Resp 20 | Ht 73.0 in | Wt 199.0 lb

## 2023-10-09 DIAGNOSIS — F988 Other specified behavioral and emotional disorders with onset usually occurring in childhood and adolescence: Secondary | ICD-10-CM | POA: Diagnosis not present

## 2023-10-09 DIAGNOSIS — E349 Endocrine disorder, unspecified: Secondary | ICD-10-CM

## 2023-10-09 DIAGNOSIS — S29012A Strain of muscle and tendon of back wall of thorax, initial encounter: Secondary | ICD-10-CM | POA: Diagnosis not present

## 2023-10-09 DIAGNOSIS — I10 Essential (primary) hypertension: Secondary | ICD-10-CM

## 2023-10-09 DIAGNOSIS — Z13220 Encounter for screening for lipoid disorders: Secondary | ICD-10-CM

## 2023-10-09 MED ORDER — LISDEXAMFETAMINE DIMESYLATE 50 MG PO CAPS: 50.0000 mg | ORAL_CAPSULE | Freq: Every day | ORAL | 0 refills | Status: AC

## 2023-10-09 MED ORDER — AMPHETAMINE-DEXTROAMPHETAMINE 10 MG PO TABS: ORAL_TABLET | ORAL | 0 refills | Status: AC

## 2023-10-09 MED ORDER — AMPHETAMINE-DEXTROAMPHETAMINE 10 MG PO TABS: 10.0000 mg | ORAL_TABLET | Freq: Every day | ORAL | 0 refills | Status: AC

## 2023-10-09 MED ORDER — AMPHETAMINE-DEXTROAMPHETAMINE 10 MG PO TABS: ORAL_TABLET | ORAL | 0 refills | Status: DC

## 2023-10-09 MED ORDER — LISDEXAMFETAMINE DIMESYLATE 50 MG PO CAPS: 50.0000 mg | ORAL_CAPSULE | Freq: Every day | ORAL | 0 refills | Status: DC

## 2023-10-09 NOTE — Assessment & Plan Note (Signed)
 Given handout for HEP.  Can try TENS to area.

## 2023-10-09 NOTE — Assessment & Plan Note (Addendum)
 BP is well controlled. Will continue to monitor this closely.  Regular exercise and low-sodium diet encouraged.

## 2023-10-09 NOTE — Progress Notes (Signed)
 Alan Soto - 38 y.o. male MRN [id number]  Date of birth: 1985-10-25  Subjective Chief Complaint  Patient presents with   Medication Refill   Labs Only    HPI Alan Soto is a 38 y.o. male here today for follow up visit.   He reports that he is doing pretty well.   Remains on combination of vyvanse  qam and adderall as needed in the afternoon.  He reports that he is doing pretty well.  Denies significant side effects at current strength.   He has been using compounded tirzepatide  and is doing well.  Weight is down about 7 lbs since March.  He does continue to exercise regularly and feels that diet is pretty good.   He is having some pain along the L lateral back area.  He has had some issues with chronic back pain and is seeing Dr. Curtis for this.  Current pain is different from his typical pain.  Denies urinary symptoms.  He has tried heat/ice, inversion and stretching.   ROS:  A comprehensive ROS was completed and negative except as noted per HPI  Allergies  Allergen Reactions   Penicillins Shortness Of Breath and Anaphylaxis    Past Medical History:  Diagnosis Date   Asthma 02/27/1992   As per patient   Deviated septum 01/05/2016   Visit with ENT 01/12/2016: Turbinate reduction, septoplasty, bilateral maxillary antrostomy planned by ENT. 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   Esophageal reflux 06/22/2015   Status post EGD 06/03/2014. 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 Labs reviewed, there was 1 mention of abnormally low testosterone  but other values had been normal. Labs done by myself were normal. Consider endocrine referral if patient wants to pursue this issue further     Past Surgical History:  Procedure Laterality Date   CHOLECYSTECTOMY  01/2015   SHOULDER SURGERY  11/2014    Social History   Socioeconomic History   Marital status: Married    Spouse name: Not on file   Number of children: Not on file   Years of education: Not on file   Highest education level: Bachelor's degree (e.g., BA, AB, BS)  Occupational History   Not on file  Tobacco Use   Smoking status: Never   Smokeless tobacco: Never  Substance and Sexual Activity   Alcohol use: Not on file   Drug use: Not on file   Sexual activity: Not on file  Other Topics Concern   Not on file  Social History Narrative   Not on file   Social Drivers of Health   Financial Resource Strain: Low  Risk  (03/13/2023)   Overall Financial Resource Strain (CARDIA)    Difficulty of Paying Living Expenses: Not hard at all  Food Insecurity: No Food Insecurity (03/13/2023)   Hunger Vital Sign    Worried About Running Out of Food in the Last Year: Never true    Ran Out of Food in the Last Year: Never true  Transportation Needs: No Transportation Needs (03/13/2023)   PRAPARE - Administrator, Civil Service (Medical): No    Lack of Transportation (Non-Medical): No  Physical Activity: Sufficiently Active (03/13/2023)    Exercise Vital Sign    Days of Exercise per Week: 6 days    Minutes of Exercise per Session: 90 min  Stress: No Stress Concern Present (03/13/2023)   [employer] of Occupational Health - Occupational Stress Questionnaire    Feeling of Stress : Not at all  Social Connections: Moderately Integrated (03/13/2023)   Social Connection and Isolation Panel    Frequency of Communication with Friends and Family: More than three times a week    Frequency of Social Gatherings with Friends and Family: Patient declined    Attends Religious Services: More than 4 times per year    Active Member of [employer] or Organizations: No    Attends Engineer, structural: Not on file    Marital Status: Married    History reviewed. No pertinent family history.  Health Maintenance  Topic Date Due   HIV Screening  Never done   Hepatitis C Screening  Never done   Hepatitis B Vaccines (1 of 3 - 19+ 3-dose series) Never done   HPV VACCINES (1 - Risk 3-dose SCDM series) Never done   INFLUENZA VACCINE  09/27/2023   Pneumococcal Vaccine: 19-49 Years (1 of 2 - PCV) 03/19/2024 (Originally 08/16/2003)   COVID-19 Vaccine (3 - Pfizer risk series) 04/04/2024 (Originally 07/28/2019)   DTaP/Tdap/Td (3 - Td or Tdap) 05/09/2028   Meningococcal B Vaccine  Aged Out     ----------------------------------------------------------------------------------------------------------------------------------------------------------------------------------------------------------------- Physical Exam BP 121/65 (BP Location: Left Arm, Patient Position: Sitting, Cuff Size: Normal)   Pulse 77   Resp 20   Ht 6' 1 (1.854 m)   Wt 199 lb (90.3 kg)   SpO2 100%   BMI 26.25 kg/m   Physical Exam Constitutional:      Appearance: Normal appearance.  Cardiovascular:     Rate and Rhythm: Normal rate and regular rhythm.  Pulmonary:     Effort: Pulmonary effort is normal.     Breath sounds: Normal breath sounds.  Neurological:      General: No focal deficit present.     Mental Status: He is alert.  Psychiatric:        Mood and Affect: Mood normal.        Behavior: Behavior normal.     ------------------------------------------------------------------------------------------------------------------------------------------------------------------------------------------------------------------- Assessment and Plan  Essential hypertension BP is well controlled. Will continue to monitor this closely.  Regular exercise and low-sodium diet encouraged.  Adult attention deficit disorder Continue [name]  at 50mg  daily  May continue adderall 10mg  in the afternoon as needed.  F/u in 6 months  Strain of latissimus dorsi muscle Given handout for HEP.  Can try TENS to area.    Meds ordered this encounter  Medications   amphetamine -dextroamphetamine  (ADDERALL) 10 MG tablet    Sig: Take 1 tab in the afternoon as needed.    Dispense:  30 tablet    Refill:  0   amphetamine -dextroamphetamine  (ADDERALL) 10 MG tablet    Sig:  Take 1 tablet (10 mg total) by mouth daily. Take in the afternoon as needed.    Dispense:  30 tablet    Refill:  0   amphetamine -dextroamphetamine  (ADDERALL) 10 MG tablet    Sig: Take in the afternoon as needed    Dispense:  30 tablet    Refill:  0   lisdexamfetamine ([name] ) 50 MG capsule    Sig: Take 1 capsule (50 mg total) by mouth daily.    Dispense:  30 capsule    Refill:  0   lisdexamfetamine ([name] ) 50 MG capsule    Sig: Take 1 capsule (50 mg total) by mouth daily.    Dispense:  30 capsule    Refill:  0   lisdexamfetamine ([name] ) 50 MG capsule    Sig: Take 1 capsule (50 mg total) by mouth daily.    Dispense:  30 capsule    Refill:  0    Return in about 6 months (around 04/10/2024) for ADHD.

## 2023-10-09 NOTE — Assessment & Plan Note (Signed)
 Continue [name]  at 50mg  daily  May continue adderall 10mg  in the afternoon as needed.  F/u in 6 months

## 2023-10-11 LAB — CMP14+EGFR
ALT: 52 IU/L — ABNORMAL HIGH (ref 0–44)
AST: 26 IU/L (ref 0–40)
Albumin: 4.5 g/dL (ref 4.1–5.1)
Alkaline Phosphatase: 48 IU/L (ref 44–121)
BUN/Creatinine Ratio: 10 (ref 9–20)
BUN: 11 mg/dL (ref 6–20)
Bilirubin Total: 0.4 mg/dL (ref 0.0–1.2)
CO2: 19 mmol/L — ABNORMAL LOW (ref 20–29)
Calcium: 9.7 mg/dL (ref 8.7–10.2)
Chloride: 103 mmol/L (ref 96–106)
Creatinine, Ser: 1.11 mg/dL (ref 0.76–1.27)
Globulin, Total: 2.6 g/dL (ref 1.5–4.5)
Glucose: 92 mg/dL (ref 70–99)
Potassium: 4.7 mmol/L (ref 3.5–5.2)
Sodium: 136 mmol/L (ref 134–144)
Total Protein: 7.1 g/dL (ref 6.0–8.5)
eGFR: 87 mL/min/1.73 (ref >59)

## 2023-10-11 LAB — CBC WITH DIFFERENTIAL/PLATELET
Basophils Absolute: 0.1 x10E3/uL (ref 0.0–0.2)
Basos: 2 %
EOS (ABSOLUTE): 0.3 x10E3/uL (ref 0.0–0.4)
Eos: 7 %
Hematocrit: 42.9 % (ref 37.5–51.0)
Hemoglobin: 14.1 g/dL (ref 13.0–17.7)
Immature Grans (Abs): 0 x10E3/uL (ref 0.0–0.1)
Immature Granulocytes: 0 %
Lymphocytes Absolute: 2.4 x10E3/uL (ref 0.7–3.1)
Lymphs: 46 %
MCH: 26.6 pg (ref 26.6–33.0)
MCHC: 32.9 g/dL (ref 31.5–35.7)
MCV: 81 fL (ref 79–97)
Monocytes Absolute: 0.4 x10E3/uL (ref 0.1–0.9)
Monocytes: 8 %
Neutrophils Absolute: 1.9 x10E3/uL (ref 1.4–7.0)
Neutrophils: 37 %
Platelets: 320 x10E3/uL (ref 150–450)
RBC: 5.3 x10E6/uL (ref 4.14–5.80)
RDW: 13.2 % (ref 11.6–15.4)
WBC: 5.2 x10E3/uL (ref 3.4–10.8)

## 2023-10-11 LAB — TESTOSTERONE: Testosterone: 753 ng/dL (ref 264–916)

## 2023-10-11 LAB — LIPID PANEL WITH LDL/HDL RATIO
Cholesterol, Total: 187 mg/dL (ref 100–199)
HDL: 40 mg/dL (ref >39)
LDL Chol Calc (NIH): 126 mg/dL — ABNORMAL HIGH (ref 0–99)
LDL/HDL Ratio: 3.2 ratio (ref 0.0–3.6)
Triglycerides: 113 mg/dL (ref 0–149)
VLDL Cholesterol Cal: 21 mg/dL (ref 5–40)

## 2023-10-11 LAB — TSH+FREE T4
Free T4: 2.01 ng/dL — ABNORMAL HIGH (ref 0.82–1.77)
TSH: 1.61 u[IU]/mL (ref 0.450–4.500)

## 2023-10-14 ENCOUNTER — Ambulatory Visit: Admitting: Family Medicine

## 2023-10-18 ENCOUNTER — Ambulatory Visit: Payer: Self-pay | Admitting: Family Medicine

## 2023-10-29 ENCOUNTER — Encounter: Payer: Self-pay | Admitting: Sports Medicine

## 2023-11-26 ENCOUNTER — Encounter: Payer: Self-pay | Admitting: Family Medicine

## 2023-11-27 ENCOUNTER — Encounter: Payer: Self-pay | Admitting: Family Medicine

## 2023-11-29 NOTE — Telephone Encounter (Signed)
 Patient informed.

## 2023-12-30 ENCOUNTER — Encounter: Payer: Self-pay | Admitting: Family Medicine

## 2024-01-28 ENCOUNTER — Encounter: Payer: Self-pay | Admitting: Family Medicine

## 2024-01-28 MED ORDER — LISDEXAMFETAMINE DIMESYLATE 50 MG PO CAPS: 50.0000 mg | ORAL_CAPSULE | Freq: Every day | ORAL | 0 refills | Status: DC

## 2024-01-28 MED ORDER — AMPHETAMINE-DEXTROAMPHETAMINE 10 MG PO TABS: ORAL_TABLET | ORAL | 0 refills | Status: DC

## 2024-02-24 ENCOUNTER — Encounter: Payer: Self-pay | Admitting: Family Medicine

## 2024-02-24 DIAGNOSIS — M7989 Other specified soft tissue disorders: Secondary | ICD-10-CM

## 2024-02-24 MED ORDER — DICLOFENAC SODIUM 75 MG PO TBEC: 75.0000 mg | DELAYED_RELEASE_TABLET | Freq: Two times a day (BID) | ORAL | 3 refills | Status: AC

## 2024-02-24 MED ORDER — LISDEXAMFETAMINE DIMESYLATE 50 MG PO CAPS: 50.0000 mg | ORAL_CAPSULE | Freq: Every day | ORAL | 0 refills | Status: DC

## 2024-02-24 MED ORDER — AMPHETAMINE-DEXTROAMPHETAMINE 10 MG PO TABS: ORAL_TABLET | ORAL | 0 refills | Status: DC

## 2024-03-09 ENCOUNTER — Telehealth (INDEPENDENT_AMBULATORY_CARE_PROVIDER_SITE_OTHER): Admitting: Family Medicine

## 2024-03-09 ENCOUNTER — Encounter: Payer: Self-pay | Admitting: Family Medicine

## 2024-03-09 VITALS — HR 71 | Ht 73.0 in | Wt 173.0 lb

## 2024-03-09 DIAGNOSIS — J40 Bronchitis, not specified as acute or chronic: Secondary | ICD-10-CM | POA: Diagnosis not present

## 2024-03-09 DIAGNOSIS — J329 Chronic sinusitis, unspecified: Secondary | ICD-10-CM | POA: Insufficient documentation

## 2024-03-09 MED ORDER — PREDNISONE 20 MG PO TABS: 20.0000 mg | ORAL_TABLET | Freq: Two times a day (BID) | ORAL | 0 refills | Status: AC

## 2024-03-09 MED ORDER — DOXYCYCLINE HYCLATE 100 MG PO TABS: 100.0000 mg | ORAL_TABLET | Freq: Two times a day (BID) | ORAL | 0 refills | Status: AC

## 2024-03-09 NOTE — Progress Notes (Signed)
 " Alan Soto - 39 y.o. male MRN [id number]  Date of birth: Feb 12, 1986   All issues noted in this document were discussed and addressed.  No physical exam was performed (except for noted visual exam findings with Video Visits).  I discussed the limitations of evaluation and management by telemedicine and the availability of in person appointments. The patient expressed understanding and agreed to proceed.  I connected withNAME@ on 03/09/2024 at  3:30 PM EST by a video enabled telemedicine application and verified that I am speaking with the correct person using two identifiers.  Present at visit: Velma Ku, DO Alan Soto   Patient Location: Home [address] [place] [address]   Provider location:   [facility]  Chief Complaint  Patient presents with   Cough    HPI  Alan Soto is a 39 y.o. male who presents via audio/video conferencing for a telehealth visit today.SABRA  He reports that he had flu like symptoms a couple of weeks ago with fever, body aches, and congestion.  Thinks it may have been flu but didn't test.  This improved and he was feeling better until a few days ago when he developed tightness in his chest with cough.  He denies continued fever or chills.    ROS:  A comprehensive ROS was completed and negative except as noted per HPI  Past Medical History:  Diagnosis Date   Asthma 02/27/1992   As per patient   Deviated septum 01/05/2016   Visit with ENT 01/12/2016: Turbinate reduction, septoplasty, bilateral maxillary antrostomy planned by ENT. 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   Esophageal reflux 06/22/2015   Status post EGD 06/03/2014. 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 Labs reviewed, there was 1 mention of abnormally low testosterone  but other values had been normal. Labs done by myself were normal. Consider endocrine referral if patient wants to pursue this issue further     Past Surgical History:  Procedure Laterality Date   CHOLECYSTECTOMY  01/2015   SHOULDER SURGERY  11/2014    No family history on file.  Social History   Socioeconomic History   Marital status: Married    Spouse name: Not on file   Number of children: Not on file   Years of education: Not on file   Highest education level: Bachelor's degree (e.g., BA, AB, BS)  Occupational History   Not on file  Tobacco Use   Smoking status: Never   Smokeless tobacco: Never  Substance and Sexual Activity   Alcohol use: Not on file   Drug use: Not on file  Sexual activity: Not on file  Other Topics Concern   Not on file  Social History Narrative   Not on file   Social Drivers of Health   Tobacco Use: Low Risk (10/09/2023)   Patient History    Smoking Tobacco Use: Never    Smokeless Tobacco Use: Never    Passive Exposure: Not on file  Financial Resource Strain: Low Risk (03/13/2023)   Overall Financial Resource Strain (CARDIA)    Difficulty of Paying Living Expenses: Not hard at all   Food Insecurity: No Food Insecurity (03/13/2023)   Hunger Vital Sign    Worried About Running Out of Food in the Last Year: Never true    Ran Out of Food in the Last Year: Never true  Transportation Needs: No Transportation Needs (03/13/2023)   PRAPARE - Administrator, Civil Service (Medical): No    Lack of Transportation (Non-Medical): No  Physical Activity: Sufficiently Active (03/13/2023)   Exercise Vital Sign    Days of Exercise per Week: 6 days    Minutes of Exercise per Session: 90 min  Stress: No Stress Concern Present (03/13/2023)   [employer] of Occupational Health - Occupational Stress Questionnaire    Feeling of Stress : Not at all  Social Connections: Moderately Integrated (03/13/2023)   Social Connection and Isolation Panel    Frequency of Communication with Friends and Family: More than three times a week    Frequency of Social Gatherings with Friends and Family: Patient declined    Attends Religious Services: More than 4 times per year    Active Member of [employer] or Organizations: No    Attends Banker Meetings: Not on file    Marital Status: Married  Catering Manager Violence: Not At Risk (10/09/2023)   Epic    Fear of Current or Ex-Partner: No    Emotionally Abused: No    Physically Abused: No    Sexually Abused: No  Depression (PHQ2-9): Low Risk (10/09/2023)   Depression (PHQ2-9)    PHQ-2 Score: 1  Alcohol Screen: Low Risk (03/13/2023)   Alcohol Screen    Last Alcohol Screening Score (AUDIT): 1  Housing: Low Risk (03/13/2023)   Housing Stability Vital Sign    Unable to Pay for Housing in the Last Year: No    Number of Times Moved in the Last Year: 0    Homeless in the Last Year: No  Utilities: Not At Risk (10/09/2023)   Epic    Threatened with loss of utilities: No  Health Literacy: Not on file    Current Medications[1]  EXAM:  VITALS per patient if applicable: Pulse 71   Ht 6' 1 (1.854 m)   Wt 173 lb (78.5 kg)   BMI 22.82  kg/m   GENERAL: alert, oriented, appears well and in no acute distress  HEENT: atraumatic, conjunttiva clear, no obvious abnormalities on inspection of external nose and ears  NECK: normal movements of the head and neck  LUNGS: on inspection no signs of respiratory distress, breathing rate appears normal, no obvious gross SOB, gasping or wheezing  CV: no obvious cyanosis  MS: moves all visible extremities without noticeable abnormality  PSYCH/NEURO: pleasant and cooperative, no obvious depression or anxiety, speech and thought processing grossly intact  ASSESSMENT AND PLAN:  Discussed the following assessment and plan:  Sinobronchitis Treating with course of doxycycline  and prednisone  burst.  Supportive care with good fluid intake encouraged.      I discussed the assessment and treatment  plan with the patient. The patient was provided an opportunity to ask questions and all were answered. The patient agreed with the plan and demonstrated an understanding of the instructions.   The patient was advised to call back or seek an in-person evaluation if the symptoms worsen or if the condition fails to improve as anticipated.    Velma Ku, DO      [1]  Current Outpatient Medications:    predniSONE  (DELTASONE ) 20 MG tablet, Take 1 tablet (20 mg total) by mouth 2 (two) times daily with a meal for 5 days., Disp: 10 tablet, Rfl: 0   albuterol  (VENTOLIN HFA) 108 (90 Base) MCG/ACT inhaler, Inhale 2 puffs into the lungs every 6 (six) hours as needed for wheezing or shortness of breath., Disp: 8 g, Rfl: 6   AMBULATORY NON FORMULARY MEDICATION, Semaglutide 2.5 mg/mL + Vit B6 10mg /mL.   Inject 60 units (1.5mg ) weekly, Disp: 2 mL, Rfl: 5   amphetamine -dextroamphetamine  (ADDERALL) 10 MG tablet, Take 1 tab in the afternoon as needed., Disp: 30 tablet, Rfl: 0   amphetamine -dextroamphetamine  (ADDERALL) 10 MG tablet, Take 1 tablet (10 mg total) by mouth daily. Take in the afternoon as needed.,  Disp: 30 tablet, Rfl: 0   amphetamine -dextroamphetamine  (ADDERALL) 10 MG tablet, Take in the afternoon as needed, Disp: 30 tablet, Rfl: 0   cyanocobalamin  (VITAMIN B12) 1000 MCG/ML injection, INJECT MONTHLY, Disp: 3 mL, Rfl: 3   diclofenac  (VOLTAREN ) 75 MG EC tablet, Take 1 tablet (75 mg total) by mouth 2 (two) times daily., Disp: 60 tablet, Rfl: 3   doxycycline  (VIBRA -TABS) 100 MG tablet, Take 1 tablet (100 mg total) by mouth 2 (two) times daily., Disp: 20 tablet, Rfl: 0   famotidine  (PEPCID ) 10 MG tablet, Take 1 tablet (10 mg total) by mouth 2 (two) times daily., Disp: 90 tablet, Rfl: 3   Ferrous Sulfate (IRON PO), Take 1 tablet by mouth daily., Disp: , Rfl:    gabapentin  (NEURONTIN ) 800 MG tablet, 1 capsule p.o. twice daily to 3 times daily., Disp: 270 tablet, Rfl: 3   HYDROcodone -acetaminophen  (NORCO/VICODIN) 5-325 MG tablet, Take 1 tablet by mouth every 8 (eight) hours as needed for moderate pain (pain score 4-6)., Disp: 15 tablet, Rfl: 0   lisdexamfetamine  ([name] ) 50 MG capsule, Take 1 capsule (50 mg total) by mouth daily., Disp: 30 capsule, Rfl: 0   lisdexamfetamine  ([name] ) 50 MG capsule, Take 1 capsule (50 mg total) by mouth daily., Disp: 30 capsule, Rfl: 0   lisdexamfetamine  ([name] ) 50 MG capsule, Take 1 capsule (50 mg total) by mouth daily., Disp: 30 capsule, Rfl: 0   SUMAtriptan  (IMITREX ) 50 MG tablet, Take 1 tablet (50 mg total) by mouth every 2 (two) hours as needed for migraine. Repeat in 2 hours if persists. MAX 3 tablets in 24 hours., Disp: 30 tablet, Rfl: 3   SYRINGE-NEEDLE, DISP, 3 ML 23G X 1 3 ML MISC, Use to inject B12, Disp: 25 each, Rfl: 1   tirzepatide  (MOUNJARO ) 10 MG/0.5ML Pen, Liposlim.  Tirzepatide /Pyridoxine/Thiamine/L-Carnitine 10mg /mL.  Inject 7.5 mg/75 units subcu weekly for 4 weeks then 10 mg/100 units subcu weekly for 4 weeks then 15 mg/150 units subcu weekly, Disp: 3 mL, Rfl: 11  "

## 2024-03-09 NOTE — Progress Notes (Signed)
 Symptoms: Flu symptoms x 2 weeks. Residual cough (sometimes productive) and chest tightness.

## 2024-03-09 NOTE — Assessment & Plan Note (Signed)
 Treating with course of doxycycline  and prednisone  burst.  Supportive care with good fluid intake encouraged.

## 2024-03-29 ENCOUNTER — Encounter: Payer: Self-pay | Admitting: Family Medicine

## 2024-04-01 MED ORDER — AMPHETAMINE-DEXTROAMPHETAMINE 10 MG PO TABS: ORAL_TABLET | ORAL | 0 refills | Status: AC

## 2024-04-01 MED ORDER — LISDEXAMFETAMINE DIMESYLATE 50 MG PO CAPS: 50.0000 mg | ORAL_CAPSULE | Freq: Every day | ORAL | 0 refills | Status: AC
